# Patient Record
Sex: Male | Born: 1940
Health system: Southern US, Community
[De-identification: ages and names within clinical notes are randomized; demographics above are authoritative.]

## PROBLEM LIST (undated history)

## (undated) DIAGNOSIS — I2510 Atherosclerotic heart disease of native coronary artery without angina pectoris: Secondary | ICD-10-CM

## (undated) DIAGNOSIS — I252 Old myocardial infarction: Secondary | ICD-10-CM

## (undated) DIAGNOSIS — C801 Malignant (primary) neoplasm, unspecified: Secondary | ICD-10-CM

## (undated) DIAGNOSIS — M1990 Unspecified osteoarthritis, unspecified site: Secondary | ICD-10-CM

## (undated) DIAGNOSIS — I2119 ST elevation (STEMI) myocardial infarction involving other coronary artery of inferior wall: Secondary | ICD-10-CM

## (undated) DIAGNOSIS — M109 Gout, unspecified: Secondary | ICD-10-CM

## (undated) DIAGNOSIS — K219 Gastro-esophageal reflux disease without esophagitis: Secondary | ICD-10-CM

## (undated) DIAGNOSIS — E7800 Pure hypercholesterolemia, unspecified: Secondary | ICD-10-CM

## (undated) DIAGNOSIS — I10 Essential (primary) hypertension: Secondary | ICD-10-CM

## (undated) DIAGNOSIS — Z8719 Personal history of other diseases of the digestive system: Secondary | ICD-10-CM

## (undated) DIAGNOSIS — W57XXXA Bitten or stung by nonvenomous insect and other nonvenomous arthropods, initial encounter: Secondary | ICD-10-CM

## (undated) HISTORY — DX: Pure hypercholesterolemia, unspecified: E78.00

## (undated) HISTORY — PX: CARDIAC CATHETERIZATION: SHX172

## (undated) HISTORY — DX: Other disorders of calcium metabolism: E83.59

## (undated) HISTORY — DX: Old myocardial infarction: I25.2

## (undated) HISTORY — PX: MELANOMA EXCISION: SHX5266

## (undated) HISTORY — DX: ST elevation (STEMI) myocardial infarction involving other coronary artery of inferior wall: I21.19

## (undated) HISTORY — DX: Gout, unspecified: M10.9

## (undated) HISTORY — DX: Atherosclerotic heart disease of native coronary artery without angina pectoris: I25.10

## (undated) HISTORY — PX: EYE SURGERY: SHX253

## (undated) HISTORY — DX: Unspecified osteoarthritis, unspecified site: M19.90

## (undated) HISTORY — PX: SHOULDER ARTHROSCOPY WITH ROTATOR CUFF REPAIR: SHX5685

## (undated) HISTORY — PX: CORONARY ANGIOPLASTY: SHX604

---

## 2000-05-16 ENCOUNTER — Ambulatory Visit (HOSPITAL_COMMUNITY): Admission: RE | Admit: 2000-05-16 | Discharge: 2000-05-16 | Payer: Self-pay | Admitting: Family Medicine

## 2000-05-16 ENCOUNTER — Encounter: Payer: Self-pay | Admitting: Family Medicine

## 2002-08-20 ENCOUNTER — Ambulatory Visit (HOSPITAL_COMMUNITY): Admission: RE | Admit: 2002-08-20 | Discharge: 2002-08-20 | Payer: Self-pay | Admitting: Gastroenterology

## 2003-10-05 ENCOUNTER — Encounter (HOSPITAL_COMMUNITY): Admission: RE | Admit: 2003-10-05 | Discharge: 2004-01-03 | Payer: Self-pay | Admitting: Cardiology

## 2005-06-08 ENCOUNTER — Encounter: Admission: RE | Admit: 2005-06-08 | Discharge: 2005-06-08 | Payer: Self-pay | Admitting: Family Medicine

## 2005-09-24 ENCOUNTER — Emergency Department (HOSPITAL_COMMUNITY): Admission: EM | Admit: 2005-09-24 | Discharge: 2005-09-24 | Payer: Self-pay | Admitting: Emergency Medicine

## 2005-09-29 ENCOUNTER — Encounter: Admission: RE | Admit: 2005-09-29 | Discharge: 2005-09-29 | Payer: Self-pay | Admitting: Family Medicine

## 2005-12-06 ENCOUNTER — Ambulatory Visit (HOSPITAL_BASED_OUTPATIENT_CLINIC_OR_DEPARTMENT_OTHER): Admission: RE | Admit: 2005-12-06 | Discharge: 2005-12-06 | Payer: Self-pay | Admitting: Orthopedic Surgery

## 2008-01-15 ENCOUNTER — Encounter: Admission: RE | Admit: 2008-01-15 | Discharge: 2008-01-15 | Payer: Self-pay | Admitting: Family Medicine

## 2008-07-26 ENCOUNTER — Emergency Department (HOSPITAL_COMMUNITY): Admission: EM | Admit: 2008-07-26 | Discharge: 2008-07-27 | Payer: Self-pay | Admitting: Emergency Medicine

## 2009-08-24 IMAGING — CT CT ABDOMEN W/ CM
2 of 5 series · 13 of 32 positions shown, 18 images · IV contrast (water/omni  & 100 ML OMNI 300)
Comparison: None available

CT ABDOMEN

CLINICAL DATA: Abdominal pain.  Rib pain.

CT ABDOMEN AND PELVIS WITH CONTRAST
TECHNIQUE: Multidetector CT imaging of the abdomen and pelvis was
performed using the standard protocol following bolus
administration of intravenous contrast.
Contrast: 100 ml [id number].

[Series 2: routine abdomen · axial · 0.83mm/px · z∈[-484,-129]mm · 6 of 101 slices shown, 11 images]
[im 15/101  soft-tissue]
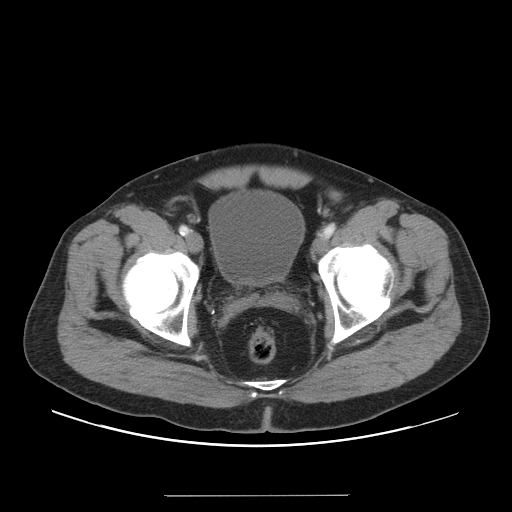
[im 15/101  bone]
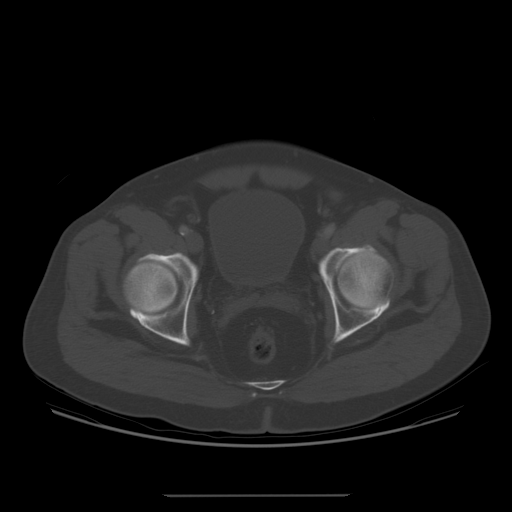
[im 29/101  soft-tissue]
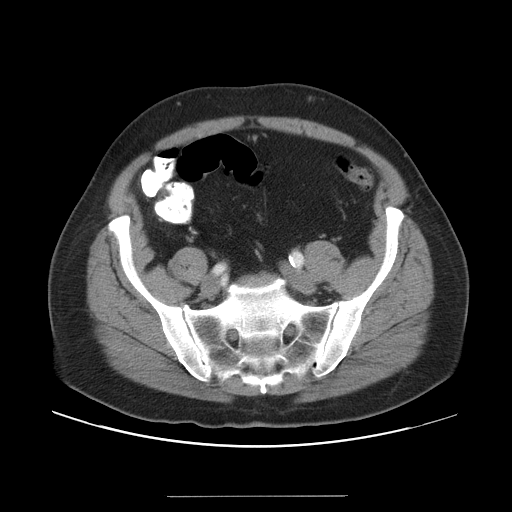
[im 43/101  soft-tissue]
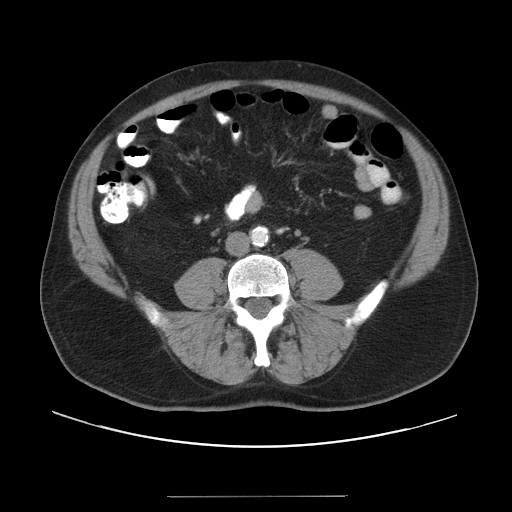
[im 43/101  lung]
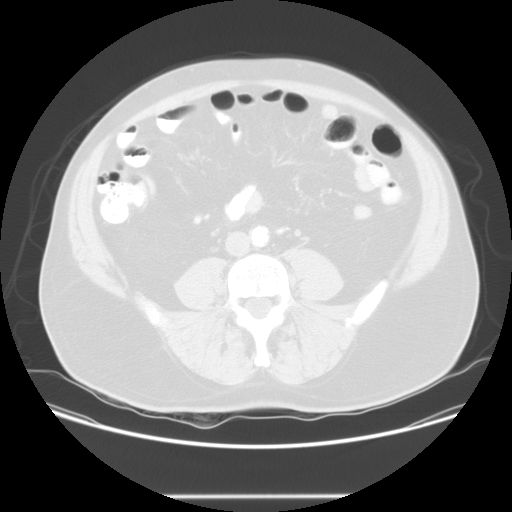
[im 58/101  soft-tissue]
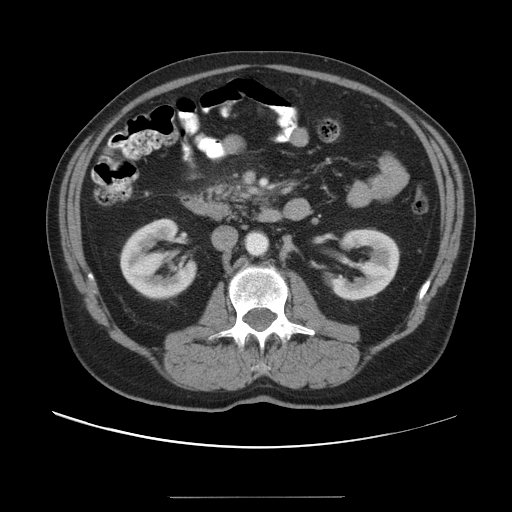
[im 58/101  lung]
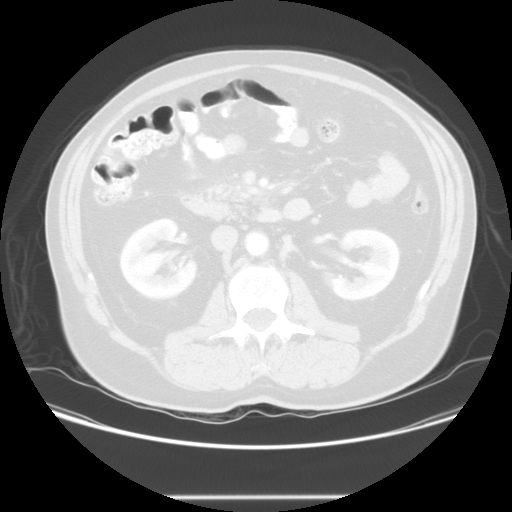
[im 72/101  soft-tissue]
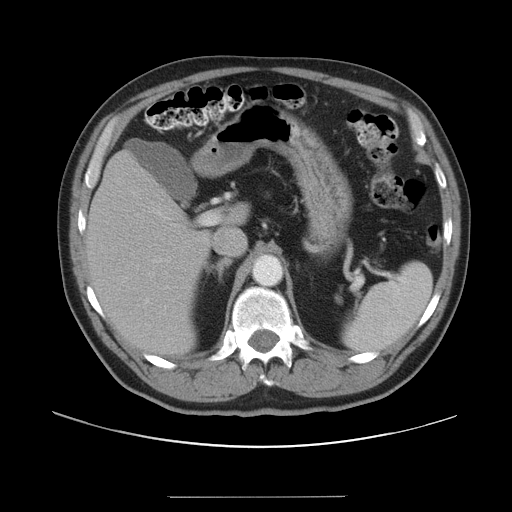
[im 72/101  lung]
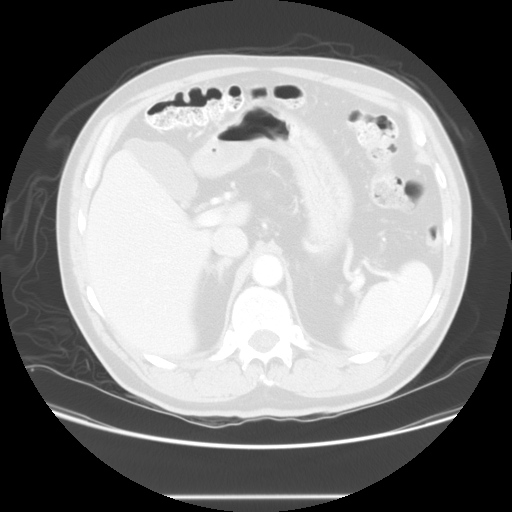
[im 86/101  soft-tissue]
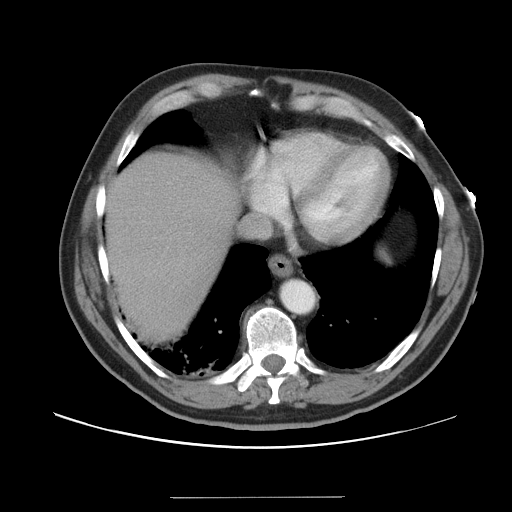
[im 86/101  lung]
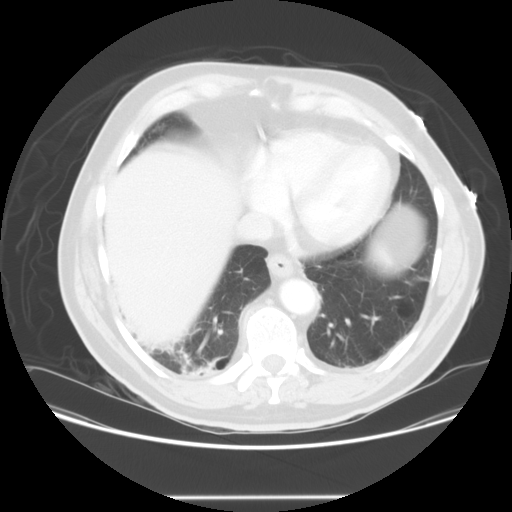

[Series 103: reformatted · sagittal · 0.90mm/px · 7 of 127 slices shown]
[im 15/127  soft-tissue]
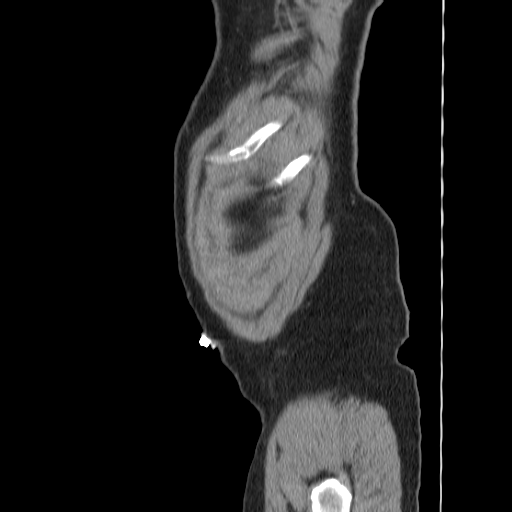
[im 29/127  soft-tissue]
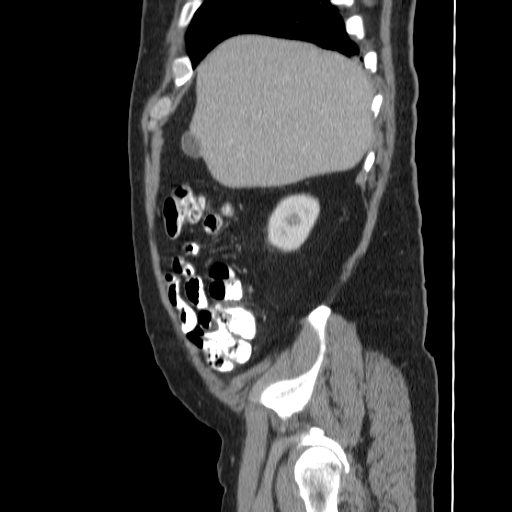
[im 43/127  soft-tissue]
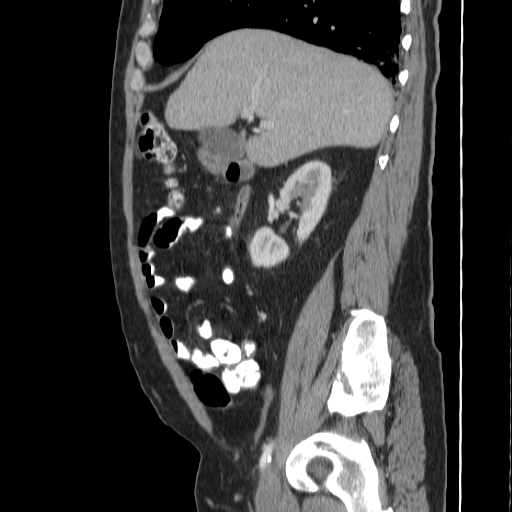
[im 57/127  soft-tissue]
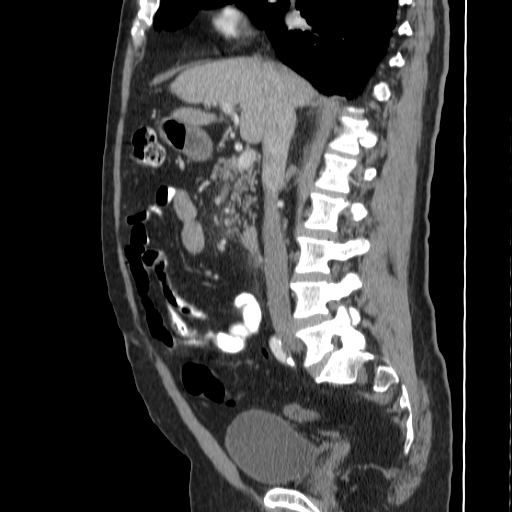
[im 71/127  soft-tissue]
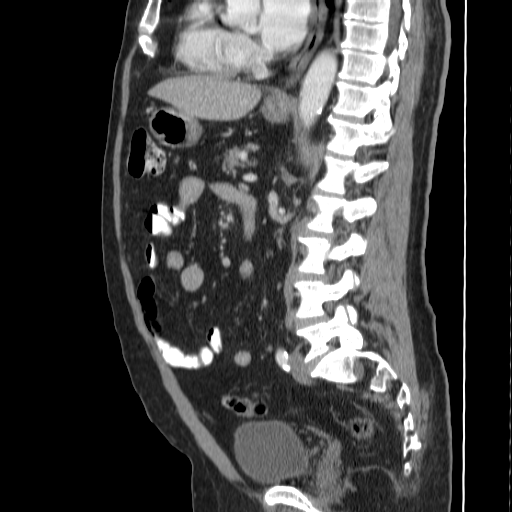
[im 85/127  soft-tissue]
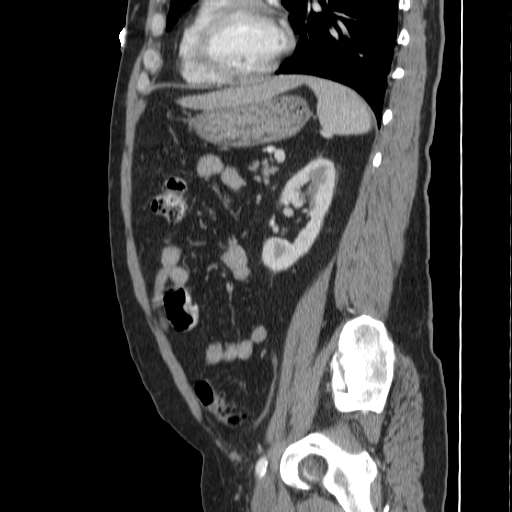
[im 99/127  soft-tissue]
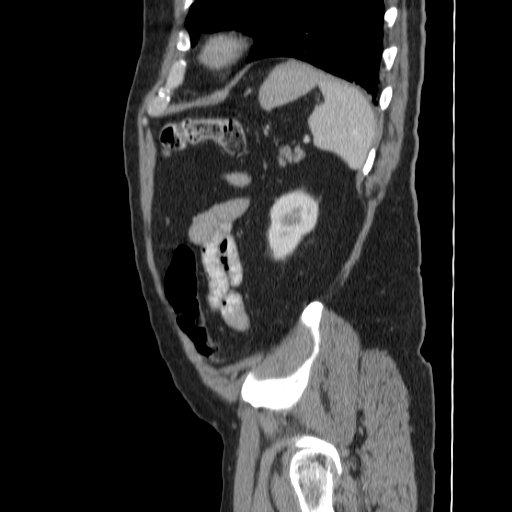

[13 of 32 positions shown; findings below may reference images not displayed]

FINDINGS: Right coronary artery stent is present.  Mild thickening
of the distal esophagus, which can be associated with
gastroesophageal reflux.  Consider endoscopic correlation as
clinically indicated.  Liver and spleen appear within normal
limits.  Stomach unremarkable.  In the posterior right lower lobe,
there is subpleural band, which is a nonspecific finding.  There is
more hazy opacity anteriorly, which is favored to represent
atelectasis however airspace disease is difficult to exclude.

There is subtle fat stranding around the uncinate process of the
pancreas and third part of the duodenum.  This could represent mild
pancreatitis.  Correlation with laboratory studies recommended.
Abdominal aorta demonstrates atherosclerosis and mild tortuosity
without aneurysm.
IMPRESSION: 1.  Subtle stranding around the uncinate process of the pancreas,
could represent pancreatitis.  Correlation with laboratory results
recommended.
2.  No other acute abdominal abnormality.
3.  Subpleural band and atelectasis in the right lower lobe.  It is
difficult to exclude a small focus of airspace disease/infection.
4.  Question gastroesophageal reflux.

CT PELVIS
FINDINGS: Normal appendix is identified.  Colon demonstrates
diverticulosis without diverticulitis.  No free fluid is present in
the anatomic pelvis.  Urinary bladder appears within normal limits.
Chunky prostatic calcifications are present.  Iliofemoral
atherosclerosis, with mural plaque in the right common iliac artery
but no occlusion.  Pelvic small bowel appears normal.
IMPRESSION: No acute pelvic abnormality.

## 2011-01-08 ENCOUNTER — Emergency Department (HOSPITAL_COMMUNITY): Payer: Managed Care, Other (non HMO)

## 2011-01-08 ENCOUNTER — Emergency Department (HOSPITAL_COMMUNITY)
Admission: EM | Admit: 2011-01-08 | Discharge: 2011-01-08 | Disposition: A | Discharge status: Home / Self Care | Payer: Managed Care, Other (non HMO) | Attending: Emergency Medicine | Admitting: Emergency Medicine

## 2011-01-08 DIAGNOSIS — I252 Old myocardial infarction: Secondary | ICD-10-CM | POA: Insufficient documentation

## 2011-01-08 DIAGNOSIS — R10816 Epigastric abdominal tenderness: Secondary | ICD-10-CM | POA: Insufficient documentation

## 2011-01-08 DIAGNOSIS — R072 Precordial pain: Secondary | ICD-10-CM | POA: Insufficient documentation

## 2011-01-08 DIAGNOSIS — R0602 Shortness of breath: Secondary | ICD-10-CM | POA: Insufficient documentation

## 2011-01-08 DIAGNOSIS — E785 Hyperlipidemia, unspecified: Secondary | ICD-10-CM | POA: Insufficient documentation

## 2011-01-08 DIAGNOSIS — R21 Rash and other nonspecific skin eruption: Secondary | ICD-10-CM | POA: Insufficient documentation

## 2011-01-08 DIAGNOSIS — K2970 Gastritis, unspecified, without bleeding: Secondary | ICD-10-CM | POA: Insufficient documentation

## 2011-01-08 DIAGNOSIS — I10 Essential (primary) hypertension: Secondary | ICD-10-CM | POA: Insufficient documentation

## 2011-01-08 DIAGNOSIS — Z79899 Other long term (current) drug therapy: Secondary | ICD-10-CM | POA: Insufficient documentation

## 2011-01-08 LAB — POCT CARDIAC MARKERS
CKMB, poc: <1 ng/mL — ABNORMAL LOW (ref 1.0–8.0)
CKMB, poc: <1 ng/mL — ABNORMAL LOW (ref 1.0–8.0)
Myoglobin, poc: 75.5 ng/mL (ref 12–200)
Myoglobin, poc: 70.1 ng/mL (ref 12–200)
Troponin i, poc: <0.05 ng/mL (ref 0.00–0.09)
Troponin i, poc: <0.05 ng/mL (ref 0.00–0.09)

## 2011-01-08 LAB — CBC
HCT: 45.4 % (ref 39.0–52.0)
Hemoglobin: 15.6 g/dL (ref 13.0–17.0)
MCH: 32.6 pg (ref 26.0–34.0)
MCHC: 34.4 g/dL (ref 30.0–36.0)
MCV: 94.8 fL (ref 78.0–100.0)
Platelets: 190 10*3/uL (ref 150–400)
RBC: 4.79 MIL/uL (ref 4.22–5.81)
RDW: 13.2 % (ref 11.5–15.5)
WBC: 13.1 10*3/uL — ABNORMAL HIGH (ref 4.0–10.5)

## 2011-01-08 LAB — BASIC METABOLIC PANEL
BUN: 16 mg/dL (ref 6–23)
CO2: 27 mEq/L (ref 19–32)
Calcium: 8.5 mg/dL (ref 8.4–10.5)
Chloride: 102 mEq/L (ref 96–112)
Creatinine, Ser: 0.84 mg/dL (ref 0.4–1.5)
GFR calc Af Amer: >60 mL/min (ref >60)
GFR calc non Af Amer: >60 mL/min (ref >60)
Glucose, Bld: 107 mg/dL — ABNORMAL HIGH (ref 70–99)
Potassium: 3.9 mEq/L (ref 3.5–5.1)
Sodium: 137 mEq/L (ref 135–145)

## 2011-01-08 LAB — DIFFERENTIAL
Basophils Absolute: 0 10*3/uL (ref 0.0–0.1)
Basophils Relative: 0 % (ref 0–1)
Eosinophils Absolute: 0.1 10*3/uL (ref 0.0–0.7)
Eosinophils Relative: 1 % (ref 0–5)
Lymphocytes Relative: 8 % — ABNORMAL LOW (ref 12–46)
Lymphs Abs: 1 10*3/uL (ref 0.7–4.0)
Monocytes Absolute: 1.1 10*3/uL — ABNORMAL HIGH (ref 0.1–1.0)
Monocytes Relative: 9 % (ref 3–12)
Neutro Abs: 10.8 10*3/uL — ABNORMAL HIGH (ref 1.7–7.7)
Neutrophils Relative %: 83 % — ABNORMAL HIGH (ref 43–77)

## 2011-03-17 NOTE — Op Note (Signed)
NAME:  Robert Reilly, Robert Reilly NO.:  [id number]   MEDICAL RECORD NO.:  [id number]          PATIENT TYPE:  AMB   LOCATION:  DSC                          FACILITY:  [facility]   PHYSICIAN:  Cindee Salt, M.D.       DATE OF BIRTH:  10-17-1941   DATE OF PROCEDURE:  12/06/2005  DATE OF DISCHARGE:                                 OPERATIVE REPORT   PREOPERATIVE DIAGNOSIS:  Ruptured extensor tendon hood sagittal fibers  radial side, left ring finger.   POSTOPERATIVE DIAGNOSIS:  Ruptured extensor tendon hood sagittal fibers  radial side, left ring finger.   OPERATION:  Reconstruction extensor hood left ring finger, with plication of  extensor tendon.   SURGEON:  Cindee Salt, M.D.   ASSISTANT:  Carolyne Fiscal R.N.   ANESTHESIA:  General.   HISTORY:  The patient is a 70 year old male who has suffered an injury to  his left ring finger, with inability to fully extend. MRI reveals probable  rupture of the extensor hood.   PROCEDURE:  The patient was brought to the operating room where a general  anesthetic was carried out without difficulty.  He was prepped using  DuraPrep, supine position, left arm free. The limb was exsanguinated with an  Esmarch bandage. Tourniquet placed on the arm was inflated to 250 mmHg.  A  straight incision was made over the metacarpophalangeal joint, proximal  phalanx, carried down through subcutaneous tissue. Bleeders were  electrocauterized.  Neurovascular structures otherwise protected. Retractors  placed. Significant scarring of the extensor hood was immediately apparent,  with some subluxation to the ulnar aspect of the hood itself. With blunt and  sharp dissection, the extensor tendon was then able to be identified and  found to be in continuity, although it was stretched directly over the  metacarpophalangeal joint. A portion of the extensor tendon to the little  finger, along with the junctura was then isolated, maintaining the majority  of the tendon to  the little finger intact. This allowed this to be used to  reconstruct the sagittal fiber of the ring finger, along with the portion of  scar tissue. This tendon portion was then passed through the extensor  digitorum communis, through the scar of the old hood, and then looped around  the intermetacarpal ligament. This was brought back onto itself and then  sutured into position with figure-of-eight 2-0 Novofil suture. This  stabilized the extensor tendon in both full flexion and extension. There  remained a very minimal lag, and the tendon was then plicated with a 4-0  Mersilene suture. The wound was copiously irrigated with saline. The finger  was maintained in fully extended position on completion.  The wound was then  closed with interrupted 5-0 nylon sutures. Sterile compressive dressing and  splint was applied, maintaining  the tendon in slightly greater extension than the remaining fingers. The  patient tolerated the procedure well was taken to the recovery for  observation in satisfactory condition. He is discharged home, to return to  the [facility] [place] in 1 week, on Vicodin.  ______________________________  Cindee Salt, M.D.     GK/MEDQ  D:  12/06/2005  T:  12/06/2005  Job:  [id number]

## 2011-03-17 NOTE — Op Note (Signed)
   NAME:  KESHAWN, FIORITO                            ACCOUNT NO.:  [id number]   MEDICAL RECORD NO.:  [id number]                   PATIENT TYPE:  AMB   LOCATION:  ENDO                                 FACILITY:  [facility]   PHYSICIAN:  Danise Edge, M.D.                DATE OF BIRTH:  1941/04/04   DATE OF PROCEDURE:  08/20/2002  DATE OF DISCHARGE:                                 OPERATIVE REPORT   PROCEDURE:  Screening colonoscopy.   INDICATIONS FOR PROCEDURE:  Mr. Toshio Slusher is a 70 year old male born [birth year]. Mr. Theurer is scheduled to undergo his first screening colonoscopy  with polypectomy to prevent colon cancer. I discussed with Mr. Fana the  complications associated with colonoscopy and polypectomy including a  15/1000 risk of bleeding and 10/998 risk of colon perforation requiring  surgical repair. Mr. Stegmann has signed the operative permit.   ENDOSCOPIST:  Charolett Bumpers, M.D.   PREMEDICATION:  Versed 10 mg, Demerol 50 mg .   ENDOSCOPE:  Olympus pediatric colonoscope.   DESCRIPTION OF PROCEDURE:  After obtaining informed consent, Mr. Conrad was  placed in the left lateral decubitus position. I administered intravenous  Demerol and intravenous Versed to achieve conscious sedation for the  procedure. The patient's blood pressure, oxygen saturation and cardiac  rhythm were monitored throughout the procedure and documented in the medical  record.   Anal inspection was normal. Digital rectal exam revealed a nonnodular  prostate. The Olympus pediatric video colonoscope was introduced into the  rectum and easily advanced to the cecum. Colonic preparation for the exam  today was excellent.   RECTUM:  Normal.   SIGMOID COLON AND DESCENDING COLON:  Normal.   SPLENIC FLEXURE:  Normal.   TRANSVERSE COLON:  Normal.   HEPATIC FLEXURE:  Normal.   ASCENDING COLON:  Normal.   CECUM AND ILEOCECAL VALVE:  Normal.    ASSESSMENT:  Normal screening proctocolonoscopy to the cecum.  No endoscopic  evidence for the presence of colorectal neoplasia.                                                 Danise Edge, M.D.    MJ/MEDQ  D:  08/20/2002  T:  08/20/2002  Job:  [id number]   cc:   L. Lupe Carney, M.D.

## 2011-07-31 LAB — DIFFERENTIAL
Basophils Absolute: 0
Basophils Relative: 0
Eosinophils Absolute: 0.3
Eosinophils Relative: 3
Lymphocytes Relative: 13
Lymphs Abs: 1.3
Monocytes Absolute: 0.7
Monocytes Relative: 7
Neutro Abs: 7.8 — ABNORMAL HIGH
Neutrophils Relative %: 77

## 2011-07-31 LAB — CK TOTAL AND CKMB (NOT AT ARMC)
CK, MB: 1.2
Relative Index: INVALID
Total CK: 49

## 2011-07-31 LAB — CBC
HCT: 43.1
Hemoglobin: 14.4
MCHC: 33.4
MCV: 97.7
Platelets: 197
RBC: 4.41
RDW: 13.9
WBC: 10

## 2011-07-31 LAB — COMPREHENSIVE METABOLIC PANEL
ALT: 17
AST: 20
Albumin: 3.5
Alkaline Phosphatase: 47
BUN: 10
CO2: 25
Calcium: 8.7
Chloride: 106
Creatinine, Ser: 0.97
GFR calc Af Amer: >60
GFR calc non Af Amer: >60
Glucose, Bld: 112 — ABNORMAL HIGH
Potassium: 3.9
Sodium: 136
Total Bilirubin: 0.7
Total Protein: 6.2

## 2011-07-31 LAB — URINALYSIS, ROUTINE W REFLEX MICROSCOPIC
Bilirubin Urine: NEGATIVE
Glucose, UA: NEGATIVE
Hgb urine dipstick: NEGATIVE
Ketones, ur: NEGATIVE
Nitrite: NEGATIVE
Protein, ur: NEGATIVE
Specific Gravity, Urine: 1.006
Urobilinogen, UA: 0.2
pH: 7.5

## 2011-07-31 LAB — LIPASE, BLOOD: Lipase: 30

## 2011-07-31 LAB — POCT CARDIAC MARKERS
CKMB, poc: 1.4
Myoglobin, poc: 56.7
Troponin i, poc: <0.05

## 2013-01-22 ENCOUNTER — Other Ambulatory Visit: Payer: Self-pay | Admitting: Rheumatology

## 2013-01-22 DIAGNOSIS — M79609 Pain in unspecified limb: Secondary | ICD-10-CM

## 2013-01-28 ENCOUNTER — Ambulatory Visit
Admission: RE | Admit: 2013-01-28 | Discharge: 2013-01-28 | Disposition: A | Discharge status: Home / Self Care | Payer: Managed Care, Other (non HMO) | Source: Ambulatory Visit | Attending: Rheumatology | Admitting: Rheumatology

## 2013-01-28 DIAGNOSIS — M79609 Pain in unspecified limb: Secondary | ICD-10-CM

## 2013-01-28 MED ORDER — GADOBENATE DIMEGLUMINE 529 MG/ML IV SOLN
18.0000 mL | Freq: Once | INTRAVENOUS | Status: AC | PRN
  Administered 2013-01-28: 18 mL via INTRAVENOUS

## 2013-07-26 ENCOUNTER — Other Ambulatory Visit: Payer: Self-pay | Admitting: Cardiology

## 2013-07-26 DIAGNOSIS — Z79899 Other long term (current) drug therapy: Secondary | ICD-10-CM

## 2013-07-26 DIAGNOSIS — E7800 Pure hypercholesterolemia, unspecified: Secondary | ICD-10-CM

## 2013-08-04 ENCOUNTER — Other Ambulatory Visit: Payer: Managed Care, Other (non HMO)

## 2013-09-09 ENCOUNTER — Encounter: Payer: Self-pay | Admitting: Cardiology

## 2013-09-09 ENCOUNTER — Encounter: Payer: Self-pay | Admitting: *Deleted

## 2013-09-10 ENCOUNTER — Ambulatory Visit (INDEPENDENT_AMBULATORY_CARE_PROVIDER_SITE_OTHER): Payer: Managed Care, Other (non HMO) | Admitting: Cardiology

## 2013-09-10 ENCOUNTER — Encounter: Payer: Self-pay | Admitting: Cardiology

## 2013-09-10 VITALS — BP 144/87 | HR 58 | Ht 68.5 in | Wt 195.8 lb

## 2013-09-10 DIAGNOSIS — I10 Essential (primary) hypertension: Secondary | ICD-10-CM | POA: Insufficient documentation

## 2013-09-10 DIAGNOSIS — M069 Rheumatoid arthritis, unspecified: Secondary | ICD-10-CM | POA: Insufficient documentation

## 2013-09-10 DIAGNOSIS — I25110 Atherosclerotic heart disease of native coronary artery with unstable angina pectoris: Secondary | ICD-10-CM | POA: Insufficient documentation

## 2013-09-10 DIAGNOSIS — I252 Old myocardial infarction: Secondary | ICD-10-CM

## 2013-09-10 DIAGNOSIS — I2510 Atherosclerotic heart disease of native coronary artery without angina pectoris: Secondary | ICD-10-CM

## 2013-09-10 HISTORY — DX: Old myocardial infarction: I25.2

## 2013-09-10 LAB — LIPID PANEL
Cholesterol: 109 mg/dL (ref 0–200)
HDL: 35.8 mg/dL — ABNORMAL LOW (ref >39.00)
LDL Cholesterol: 41 mg/dL (ref 0–99)
Total CHOL/HDL Ratio: 3
Triglycerides: 162 mg/dL — ABNORMAL HIGH (ref 0.0–149.0)
VLDL: 32.4 mg/dL (ref 0.0–40.0)

## 2013-09-10 LAB — ALT: ALT: 30 U/L (ref 0–53)

## 2013-09-10 NOTE — Patient Instructions (Signed)
Your physician recommends that you continue on your current medications as directed. Please refer to the Current Medication list given to you today.  Your physician recommends that you have labs today: Lipid, ALT  Your physician wants you to follow-up in:  1 year with Dr. Anne Fu. You will receive a reminder letter in the mail two months in advance. If you don't receive a letter, please call our office to schedule the follow-up appointment.

## 2013-09-10 NOTE — Progress Notes (Signed)
[address] [place], [place]  [address] Phone: [phone] Fax:  [phone]  Date:  09/10/2013   ID:  Robert Reilly, DOB [birth year], MRN [id number]  PCP:  Lupe Carney, MD   History of Present Illness: Robert Reilly is a 72 y.o. male with coronary artery disease post myocardial infarction in 2004 with stent to RCA who recently was seen secondary to elevated blood pressure.  Nuc- inferior infarct with no ischemia, normal EF on 01/10/11. No change from prior.   His wife is doing to need a repeat shunt procedure at [facility].  He has been diagnosed with Rheumatoid arthritis. Ankle pain. Swelling. This was prior to amlodipine.  At home previously having diasotlics of 100 at times. 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  Had cataract surgery done. Had brief episode of chest discomfort lasting approximally one hour that was fleeting, no further episodes. Worried him briefly, moderate in severity.  His son is interested in giving him a diet, liquid diet. He is interested in trying to come off of some of the medications. I discussed this with him. I discussed the importance of secondary prevention.    Wt Readings from Last 3 Encounters:  09/10/13 195 lb 12.8 oz (88.814 kg)     Past Medical History  Diagnosis Date  . Hypercholesteremia   . Coronary atherosclerosis of native coronary artery   . Gout   . Osteoarthritis   . Acute myocardial infarction of other inferior wall, subsequent episode of care   . Other disorder of calcium metabolism   . Old MI (myocardial infarction) 09/10/2013    2004, stent to RCA    No past surgical history on file.  Current Outpatient Prescriptions  Medication Sig Dispense Refill  . amLODipine (NORVASC) 5 MG tablet Take 5 mg by mouth daily.       Marland Kitchen aspirin 81 MG tablet Take 81 mg by mouth daily.      . colchicine 0.6 MG tablet Take 0.6 mg by mouth daily.      . hydrochlorothiazide (HYDRODIURIL) 25 MG tablet Take 25 mg  by mouth daily.      . meloxicam (MOBIC) 15 MG tablet Take 15 mg by mouth daily.       . metoprolol succinate (TOPROL XL) 50 MG 24 hr tablet Take 50 mg by mouth daily. Take with or immediately following a meal.      . nitroGLYCERIN (NITROSTAT) 0.4 MG SL tablet Place 0.4 mg under the tongue every 5 (five) minutes as needed for chest pain.      Marland Kitchen omeprazole (PRILOSEC) 20 MG capsule Take 20 mg by mouth daily.      . ramipril (ALTACE) 10 MG capsule Take 10 mg by mouth 2 (two) times daily.       . rosuvastatin (CRESTOR) 20 MG tablet Take 20 mg by mouth daily.      . VESICARE 5 MG tablet Take 5 mg by mouth daily.        No current facility-administered medications for this visit.    Allergies:   No Known Allergies  Social History:  The patient  reports that he has quit smoking. He does not have any smokeless tobacco history on file. He reports that he drinks about 4.2 ounces of alcohol per week. He reports that he does not use illicit drugs.   ROS:  Please see the history of present illness.   Denies any syncope, bleeding, orthopnea, PND  PHYSICAL EXAM: VS:  BP 144/87  Pulse 58  Ht 5' 8.5" (1.74 m)  Wt 195 lb 12.8 oz (88.814 kg)  BMI 29.33 kg/m2 Well nourished, well developed, in no acute distress HEENT: normal Neck: no JVD Cardiac:  normal S1, S2; RRR; no murmur Lungs:  clear to auscultation bilaterally, no wheezing, rhonchi or rales Abd: soft, nontender, no hepatomegalyMildly overweight Ext: no edema Skin: warm and dry Neuro: no focal abnormalities noted  EKG:  Sinus bradycardia rate 54 with no other abnormalities.     ASSESSMENT AND PLAN:  1. Coronary artery disease-currently stable, no symptoms. Medications reviewed. 2. Old myocardial infarction-inferior. Prior RCA stent. 3. Rheumatoid arthritis-currently being managed by rheumatology. Dr. Nickola Major. Only one flair up. Toes and fingers. Originally ankle edema. Being treated for gout currently. Able to walk over the past month.  Improved. 4. Hypertension-borderline elevated today. Continue with current medications. 5. Hyperlipidemia-Crestor. We'll check lipid profile, ALT.  Signed, Donato Schultz, MD [facility]  09/10/2013 12:07 PM

## 2013-10-28 ENCOUNTER — Other Ambulatory Visit: Payer: Self-pay | Admitting: Cardiology

## 2014-02-18 ENCOUNTER — Other Ambulatory Visit: Payer: Self-pay | Admitting: Cardiology

## 2014-03-05 ENCOUNTER — Ambulatory Visit (INDEPENDENT_AMBULATORY_CARE_PROVIDER_SITE_OTHER): Payer: BC Managed Care – PPO | Admitting: *Deleted

## 2014-03-05 DIAGNOSIS — Z79899 Other long term (current) drug therapy: Secondary | ICD-10-CM

## 2014-03-05 DIAGNOSIS — E7800 Pure hypercholesterolemia, unspecified: Secondary | ICD-10-CM

## 2014-03-05 LAB — BASIC METABOLIC PANEL
BUN: 21 mg/dL (ref 6–23)
CO2: 29 mEq/L (ref 19–32)
Calcium: 9.1 mg/dL (ref 8.4–10.5)
Chloride: 105 mEq/L (ref 96–112)
Creatinine, Ser: 1 mg/dL (ref 0.4–1.5)
GFR: 79.95 mL/min (ref >60.00)
Glucose, Bld: 102 mg/dL — ABNORMAL HIGH (ref 70–99)
Potassium: 3.3 mEq/L — ABNORMAL LOW (ref 3.5–5.1)
Sodium: 141 mEq/L (ref 135–145)

## 2014-03-05 LAB — ALT: ALT: 26 U/L (ref 0–53)

## 2014-03-06 LAB — NMR LIPOPROFILE WITH LIPIDS
Cholesterol, Total: 142 mg/dL (ref <200)
HDL Particle Number: 36.6 umol/L (ref >=30.5)
HDL Size: 8.6 nm — ABNORMAL LOW (ref >=9.2)
HDL-C: 38 mg/dL — ABNORMAL LOW (ref >=40)
LDL (calc): 64 mg/dL (ref <100)
LDL Particle Number: 1040 nmol/L — ABNORMAL HIGH (ref <1000)
LDL Size: 19.7 nm — ABNORMAL LOW (ref >20.5)
LP-IR Score: 75 — ABNORMAL HIGH (ref <=45)
Large HDL-P: 2.6 umol/L — ABNORMAL LOW (ref >=4.8)
Large VLDL-P: 6.5 nmol/L — ABNORMAL HIGH (ref <=2.7)
Small LDL Particle Number: 846 nmol/L — ABNORMAL HIGH (ref <=527)
Triglycerides: 199 mg/dL — ABNORMAL HIGH (ref <150)
VLDL Size: 46.9 nm — ABNORMAL HIGH (ref <=46.6)

## 2014-03-09 ENCOUNTER — Ambulatory Visit (INDEPENDENT_AMBULATORY_CARE_PROVIDER_SITE_OTHER): Payer: BC Managed Care – PPO | Admitting: Cardiology

## 2014-03-09 ENCOUNTER — Encounter: Payer: Self-pay | Admitting: Cardiology

## 2014-03-09 VITALS — BP 147/87 | HR 64 | Ht 68.0 in | Wt 198.0 lb

## 2014-03-09 DIAGNOSIS — I252 Old myocardial infarction: Secondary | ICD-10-CM

## 2014-03-09 DIAGNOSIS — E7800 Pure hypercholesterolemia, unspecified: Secondary | ICD-10-CM

## 2014-03-09 DIAGNOSIS — I10 Essential (primary) hypertension: Secondary | ICD-10-CM

## 2014-03-09 DIAGNOSIS — I2510 Atherosclerotic heart disease of native coronary artery without angina pectoris: Secondary | ICD-10-CM

## 2014-03-09 DIAGNOSIS — M069 Rheumatoid arthritis, unspecified: Secondary | ICD-10-CM

## 2014-03-09 NOTE — Patient Instructions (Signed)
Your physician recommends that you continue on your current medications as directed. Please refer to the Current Medication list given to you today.  Your physician wants you to follow-up in: 1 year with Dr. Dawna Part will receive a reminder letter in the mail two months in advance. If you don't receive a letter, please call our office to schedule the follow-up appointment.

## 2014-03-09 NOTE — Progress Notes (Signed)
[place]. [address], [address] [place],   [address] Phone: [phone] Fax:  [phone]  Date:  03/09/2014   ID:  Robert Reilly, DOB February 03, 1941, MRN [id number]  PCP:  Donnie Coffin, MD   History of Present Illness: Robert Reilly is a 73 y.o. male with coronary artery disease post myocardial infarction in 2004 with stent to RCA who recently was seen secondary to elevated blood pressure.  Nuc- inferior infarct with no ischemia, normal EF on 01/10/11. No change from prior.   His wife is doing to need a repeat shunt procedure at [facility].  He has been diagnosed with Rheumatoid arthritis, pseudogout. Ankle pain. Swelling. This was prior to amlodipine. Meloxicam has helped. Toe improved.   At home previously having diasotlics of 440 at times. 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  Had cataract surgery done. Had brief episode of chest discomfort lasting approximally one hour that was fleeting, no further episodes. Worried him briefly, moderate in severity.  His son is interested in giving him a diet, liquid diet. He is interested in trying to come off of some of the medications. I discussed this with him. I discussed the importance of secondary prevention.  Admits to missing the Crestor once a week. Small LDL 846. He is motivated for walking, exercise. He admits that he does a lot of walking during hunting season but recently has been renovating his house and this was stopped. Enjoyed a [employer] Gin and tonic.     Wt Readings from Last 3 Encounters:  03/09/14 198 lb (89.812 kg)  09/10/13 195 lb 12.8 oz (88.814 kg)     Past Medical History  Diagnosis Date  . Hypercholesteremia   . Coronary atherosclerosis of native coronary artery   . Gout   . Osteoarthritis   . Acute myocardial infarction of other inferior wall, subsequent episode of care   . Other disorder of calcium metabolism   . Old MI (myocardial infarction) 09/10/2013    2004, stent to RCA    No past  surgical history on file.  Current Outpatient Prescriptions  Medication Sig Dispense Refill  . amLODipine (NORVASC) 5 MG tablet TAKE 1 TABLET BY MOUTH DAILY.  30 tablet  5  . aspirin 81 MG tablet Take 81 mg by mouth daily.      . colchicine 0.6 MG tablet Take 0.6 mg by mouth daily.      . CRESTOR 20 MG tablet TAKE 1 TABLET BY MOUTH DAILY AT BEDTIME.  30 tablet  6  . hydrochlorothiazide (HYDRODIURIL) 25 MG tablet Take 25 mg by mouth daily.      . meloxicam (MOBIC) 15 MG tablet Take 15 mg by mouth daily.       . metoprolol succinate (TOPROL XL) 50 MG 24 hr tablet Take 50 mg by mouth daily. Take with or immediately following a meal.      . nitroGLYCERIN (NITROSTAT) 0.4 MG SL tablet Place 0.4 mg under the tongue every 5 (five) minutes as needed for chest pain.      Marland Kitchen omeprazole (PRILOSEC) 20 MG capsule Take 20 mg by mouth daily.      . ramipril (ALTACE) 10 MG capsule Take 10 mg by mouth 2 (two) times daily.       . VESICARE 5 MG tablet Take 5 mg by mouth daily.        No current facility-administered medications for this visit.    Allergies:   No Known Allergies  Social History:  The patient  reports that he has quit smoking. He does not have any smokeless tobacco history on file. He reports that he drinks about 4.2 ounces of alcohol per week. He reports that he does not use illicit drugs.   ROS:  Please see the history of present illness.   Denies any syncope, bleeding, orthopnea, PND   PHYSICAL EXAM: VS:  BP 147/87  Pulse 64  Ht 5\' 8"  (1.727 m)  Wt 198 lb (89.812 kg)  BMI 30.11 kg/m2 Well nourished, well developed, in no acute distress HEENT: normal Neck: no JVD Cardiac:  normal S1, S2; RRR; no murmur Lungs:  clear to auscultation bilaterally, no wheezing, rhonchi or rales Abd: soft, nontender, no hepatomegalyMildly overweight Ext: no edema Skin: warm and dry Neuro: no focal abnormalities noted  EKG:  Sinus bradycardia rate 54 with no other abnormalities.    LABS: LDL 64,  triglycerides 199 ASSESSMENT AND PLAN:  1. Coronary artery disease-currently stable, no symptoms. Medications reviewed. 2. Old myocardial infarction-inferior. Prior RCA stent. 3. Rheumatoid arthritis-currently being managed by rheumatology. Dr. Trudie Reed. Only one flair up. Toes and fingers. Originally ankle edema. Being treated for gout currently. Able to walk over the past month. Improved. Taking NSAID. We discussed small incremental risk and myocardial infarction with anti-inflammatory chronic use. 4. Hypertension-borderline elevated today. Continue with current medications. Discussed diet, exercise at length. 5. Hyperlipidemia-Crestor. Small LDL increased. I mentioned to him the possibility of starting Niaspan however he would like to continue with diet, exercise, become more active. His son is trying to help motivate him. I'm fine with him continuing with Crestor only. He does admit to missing a dose perhaps once a week.   Signed, Candee Furbish, MD [facility]  03/09/2014 9:56 AM

## 2014-03-31 ENCOUNTER — Other Ambulatory Visit: Payer: Self-pay | Admitting: Cardiology

## 2014-04-14 ENCOUNTER — Other Ambulatory Visit: Payer: Self-pay | Admitting: Cardiology

## 2014-06-16 ENCOUNTER — Other Ambulatory Visit: Payer: Self-pay

## 2014-06-16 MED ORDER — ROSUVASTATIN CALCIUM 20 MG PO TABS: ORAL_TABLET | ORAL | Status: DC

## 2014-08-17 ENCOUNTER — Other Ambulatory Visit: Payer: Self-pay | Admitting: Cardiology

## 2015-01-29 ENCOUNTER — Other Ambulatory Visit: Payer: Self-pay | Admitting: Cardiology

## 2015-03-15 ENCOUNTER — Encounter: Payer: Self-pay | Admitting: Cardiology

## 2015-03-15 ENCOUNTER — Ambulatory Visit (INDEPENDENT_AMBULATORY_CARE_PROVIDER_SITE_OTHER): Payer: BLUE CROSS/BLUE SHIELD | Admitting: Cardiology

## 2015-03-15 ENCOUNTER — Other Ambulatory Visit: Payer: Self-pay | Admitting: *Deleted

## 2015-03-15 VITALS — BP 122/86 | HR 51 | Ht 68.0 in | Wt 198.0 lb

## 2015-03-15 DIAGNOSIS — Z79899 Other long term (current) drug therapy: Secondary | ICD-10-CM | POA: Diagnosis not present

## 2015-03-15 DIAGNOSIS — M069 Rheumatoid arthritis, unspecified: Secondary | ICD-10-CM | POA: Diagnosis not present

## 2015-03-15 DIAGNOSIS — E785 Hyperlipidemia, unspecified: Secondary | ICD-10-CM

## 2015-03-15 DIAGNOSIS — I10 Essential (primary) hypertension: Secondary | ICD-10-CM | POA: Diagnosis not present

## 2015-03-15 DIAGNOSIS — I2510 Atherosclerotic heart disease of native coronary artery without angina pectoris: Secondary | ICD-10-CM

## 2015-03-15 DIAGNOSIS — I252 Old myocardial infarction: Secondary | ICD-10-CM | POA: Diagnosis not present

## 2015-03-15 LAB — LDL CHOLESTEROL, DIRECT: Direct LDL: 76 mg/dL

## 2015-03-15 LAB — LIPID PANEL
Cholesterol: 146 mg/dL (ref 0–200)
HDL: 40.1 mg/dL (ref >39.00)
NonHDL: 105.9
Total CHOL/HDL Ratio: 4
Triglycerides: 216 mg/dL — ABNORMAL HIGH (ref 0.0–149.0)
VLDL: 43.2 mg/dL — ABNORMAL HIGH (ref 0.0–40.0)

## 2015-03-15 LAB — ALT: ALT: 22 U/L (ref 0–53)

## 2015-03-15 NOTE — Progress Notes (Signed)
[place]. [address], [place]  [address] Phone: [phone] Fax:  [phone]  Date:  03/15/2015   ID:  Robert Reilly, DOB 02/09/1941, MRN [id number]  PCP:  Donnie Coffin, MD   History of Present Illness: Robert Reilly is a 73 y.o. male with coronary artery disease post myocardial infarction in 2004 with stent to RCA who recently was seen secondary to elevated blood pressure.  Nuc- inferior infarct with no ischemia, normal EF on 01/10/11. No change from prior.   His wife is doing to need a repeat shunt procedure at [facility].  He has been diagnosed with Rheumatoid arthritis, pseudogout. Ankle pain. Swelling. This was prior to amlodipine. Meloxicam has helped. Toe improved.   At home previously having diasotlics of 329 at times. 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  Had cataract surgery done. Had brief episode of chest discomfort lasting approximally one hour that was fleeting, no further episodes. Worried him briefly, moderate in severity.  His son is interested in giving him a diet, liquid diet. He is interested in trying to come off of some of the medications. I discussed this with him. I discussed the importance of secondary prevention.  Admits to missing the Crestor once a week. Small LDL 846. He is motivated for walking, exercise. He admits that he does a lot of walking during hunting season but recently has been renovating his house and this was stopped. Enjoyed a [employer] Gin and tonic here and there.     Wt Readings from Last 3 Encounters:  03/15/15 198 lb (89.812 kg)  03/09/14 198 lb (89.812 kg)  09/10/13 195 lb 12.8 oz (88.814 kg)     Past Medical History  Diagnosis Date  . Hypercholesteremia   . Coronary atherosclerosis of native coronary artery   . Gout   . Osteoarthritis   . Acute myocardial infarction of other inferior wall, subsequent episode of care   . Other disorder of calcium metabolism   . Old MI (myocardial infarction)  09/10/2013    2004, stent to RCA    No past surgical history on file.  Current Outpatient Prescriptions  Medication Sig Dispense Refill  . allopurinol (ZYLOPRIM) 100 MG tablet Take 100 mg by mouth 2 (two) times daily.   1  . amLODipine (NORVASC) 5 MG tablet TAKE 1 TABLET BY MOUTH DAILY. 30 tablet 6  . aspirin 81 MG tablet Take 81 mg by mouth daily.    . colchicine 0.6 MG tablet Take 0.6 mg by mouth daily.    . CRESTOR 20 MG tablet TAKE 1 TABLET BY MOUTH DAILY AT BEDTIME. 30 tablet 1  . hydrochlorothiazide (HYDRODIURIL) 25 MG tablet TAKE 1 TABLET BY MOUTH EVERY MORNING. 30 tablet 12  . meloxicam (MOBIC) 15 MG tablet Take 15 mg by mouth daily.     . metoprolol succinate (TOPROL-XL) 50 MG 24 hr tablet TAKE 1 TABLET BY MOUTH DAILY. 30 tablet 12  . nitroGLYCERIN (NITROSTAT) 0.4 MG SL tablet Place 0.4 mg under the tongue every 5 (five) minutes as needed for chest pain.    Marland Kitchen omeprazole (PRILOSEC) 20 MG capsule TAKE 1 CAPSULE BY MOUTH DAILY. 30 capsule 12  . ramipril (ALTACE) 10 MG capsule TAKE 1 CAPSULE BY MOUTH TWICE DAILY. 60 capsule 12  . VESICARE 5 MG tablet Take 5 mg by mouth daily.      No current facility-administered medications for this visit.    Allergies:    Allergies  Allergen Reactions  . Tetracyclines & Related Rash    Social History:  The patient  reports that he has quit smoking. He does not have any smokeless tobacco history on file. He reports that he drinks about 4.2 oz of alcohol per week. He reports that he does not use illicit drugs.   ROS:  Please see the history of present illness.   Denies any syncope, bleeding, orthopnea, PND   PHYSICAL EXAM: VS:  BP 122/86 mmHg  Pulse 51  Ht 5\' 8"  (1.727 m)  Wt 198 lb (89.812 kg)  BMI 30.11 kg/m2 Well nourished, well developed, in no acute distress HEENT: normal Neck: no JVD Cardiac:  normal S1, S2; RRR; no murmur Lungs:  clear to auscultation bilaterally, no wheezing, rhonchi or rales Abd: soft, nontender, no  hepatomegalyMildly overweight Ext: no edema Skin: warm and dry Neuro: no focal abnormalities noted  EKG:  03/15/15 - sinus bradycardia rate 51 with no other abnormalities-prior Sinus bradycardia rate 54 with no other abnormalities.     LABS: 03/05/14-LDL 64, triglycerides 199  ASSESSMENT AND PLAN:  1. Coronary artery disease-currently stable, no symptoms. Medications reviewed. ASA 81. Bb. Statin. 2. Old myocardial infarction-inferior. Prior RCA stent. 3. Rheumatoid arthritis-currently being managed by rheumatology. Dr. Trudie Reed. Only one flair up. Toes and fingers. Originally ankle edema. Being treated for gout currently. Able to walk over the past month. Improved. Taking NSAID. We discussed small incremental risk and myocardial infarction with anti-inflammatory chronic use. 4. Hypertension-borderline elevated today. Continue with current medications. Discussed diet, exercise at length. 5. Hyperlipidemia-Crestor. Small LDL increased previously. Continue with current dose. Diet. checking lipid profile, ALT.  6. Obesity-encourage weight loss. He is motivated. He is quite active, hunting area  7. One-year follow-up  Signed, Candee Furbish, MD [facility]  03/15/2015 10:01 AM

## 2015-03-15 NOTE — Patient Instructions (Signed)
Medication Instructions:  Your physician recommends that you continue on your current medications as directed. Please refer to the Current Medication list given to you today.  Labwork: Lipid and ALT today  Follow-Up: Follow up in 1 year with Dr. Marlou Porch.  You will receive a letter in the mail 2 months before you are due.  Please call us when you receive this letter to schedule your follow up appointment.  Thank you for choosing [facility]!!

## 2015-03-15 NOTE — Addendum Note (Signed)
Addended by: Eulis Foster on: 03/15/2015 10:18 AM   Modules accepted: Orders

## 2015-03-31 ENCOUNTER — Other Ambulatory Visit: Payer: Self-pay | Admitting: Cardiology

## 2015-04-01 NOTE — Telephone Encounter (Signed)
Jerline Pain, MD at 03/15/2015 9:46 AM  CRESTOR 20 MG tablet TAKE 1 TABLET BY MOUTH DAILY AT BEDTIME         1. Coronary artery disease-currently stable, no symptoms. Medications reviewed. ASA 81. Bb. Statin  Medication Instructions:  Your physician recommends that you continue on your current medications as directed. Please refer to the Current Medication list given to you today

## 2015-04-13 ENCOUNTER — Other Ambulatory Visit: Payer: Self-pay | Admitting: Cardiology

## 2015-04-30 ENCOUNTER — Other Ambulatory Visit: Payer: Self-pay

## 2015-04-30 MED ORDER — RAMIPRIL 10 MG PO CAPS: 10.0000 mg | ORAL_CAPSULE | Freq: Two times a day (BID) | ORAL | Status: DC

## 2015-05-18 ENCOUNTER — Other Ambulatory Visit: Payer: Self-pay | Admitting: Cardiology

## 2015-09-07 DIAGNOSIS — H903 Sensorineural hearing loss, bilateral: Secondary | ICD-10-CM | POA: Insufficient documentation

## 2015-09-07 DIAGNOSIS — H6123 Impacted cerumen, bilateral: Secondary | ICD-10-CM | POA: Insufficient documentation

## 2015-10-21 ENCOUNTER — Telehealth: Payer: Self-pay | Admitting: Cardiology

## 2015-10-21 NOTE — Telephone Encounter (Signed)
Spoke with pt who reports he is experiencing the symptoms of a head cold that is going to his chest.  Advised he can use coricidin, plain robitussin, saline nasal spray/washes, tylenol.  He states understanding.

## 2015-10-21 NOTE — Telephone Encounter (Signed)
New message     What can pt take for a head cold?

## 2016-01-13 ENCOUNTER — Other Ambulatory Visit: Payer: Self-pay | Admitting: Cardiology

## 2016-02-29 ENCOUNTER — Telehealth: Payer: Self-pay | Admitting: Cardiology

## 2016-02-29 NOTE — Telephone Encounter (Signed)
Spoke with pt about his medications and BP.  He states Dr Virgilio Belling office checked his BP and told him to take 1/2 dose of amlodipine and HCTZ (he thinks) already.  Advised we were not aware that Dr Alroy Dust had given him instructions already however Dr Marlou Porch recommendation is to stop both of these medications.  Pt states understanding and will continue to monitor his BP between now and the time of his appt with Dr Marlou Porch on May 16.

## 2016-02-29 NOTE — Telephone Encounter (Signed)
Pt went to [employer] and lost 22 lbs in 6 weeks, about a week ago he felt dizzy and BP was 108/63-called Dr. Virgilio Belling office and was told to call our office -pls advise [phone]

## 2016-02-29 NOTE — Telephone Encounter (Signed)
Attempted to call pt but NA and mailbox is full.  Reviewed with Dr Marlou Porch who gave instructions to discontinue Amlodipine and HCTZ.  Will continue to attempt to contact pt.

## 2016-03-14 ENCOUNTER — Ambulatory Visit (INDEPENDENT_AMBULATORY_CARE_PROVIDER_SITE_OTHER): Payer: BLUE CROSS/BLUE SHIELD | Admitting: Cardiology

## 2016-03-14 ENCOUNTER — Encounter: Payer: Self-pay | Admitting: Cardiology

## 2016-03-14 VITALS — BP 126/86 | HR 52 | Ht 68.0 in | Wt 177.8 lb

## 2016-03-14 DIAGNOSIS — I252 Old myocardial infarction: Secondary | ICD-10-CM | POA: Diagnosis not present

## 2016-03-14 DIAGNOSIS — E785 Hyperlipidemia, unspecified: Secondary | ICD-10-CM | POA: Diagnosis not present

## 2016-03-14 DIAGNOSIS — I10 Essential (primary) hypertension: Secondary | ICD-10-CM

## 2016-03-14 MED ORDER — METOPROLOL SUCCINATE ER 25 MG PO TB24: 25.0000 mg | ORAL_TABLET | Freq: Every day | ORAL | Status: DC

## 2016-03-14 NOTE — Patient Instructions (Addendum)
Medication Instructions:  Please decrease your Metoprolol to 25 mg a day. Continue all other medications as listed.  Follow-Up: Follow up in 1 year with Dr. Marlou Porch.  You will receive a letter in the mail 2 months before you are due.  Please call us when you receive this letter to schedule your follow up appointment.  If you need a refill on your cardiac medications before your next appointment, please call your pharmacy.  Thank you for choosing [facility]!!

## 2016-03-14 NOTE — Progress Notes (Signed)
[facility]. [address], [address], [place]  [address] Phone: [phone] Fax:  [phone]  Date:  03/14/2016   ID:  Robert Reilly, DOB [birth year], MRN [id number]  PCP:  Donnie Coffin, MD   History of Present Illness: Robert Reilly is a 75 y.o. male with coronary artery disease post myocardial infarction in 2004 with stent to RCA who was seen secondary to elevated blood pressure. recently after weight loss, his blood pressure has decreased significantly. In fact, he is off of amlodipine and HCTZ.   Nuc- inferior infarct with no ischemia, normal EF on 01/10/11. No change from prior.   His wife had a repeat shunt procedure at [facility].  He has been diagnosed with Rheumatoid arthritis, pseudogout. Ankle pain. Swelling. This was prior to amlodipine. Meloxicam has helped. Toe improved.   At home previously having diasotlics of [id number] at times. 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  Had cataract surgery done. Had brief episode of chest discomfort lasting approximally one hour that was fleeting, no further episodes. Worried him briefly, moderate in severity.  He is interested in trying to come off of some of the medications. I discussed this with him. I discussed the importance of secondary prevention.  Admits to missing the Crestor once a week. Small LDL 846. He is motivated for walking, exercise. He admits that he does a lot of walking during hunting season but recently has been renovating his house and this was stopped. Enjoyed a [employer] Gin and tonic here and there.   He had some hypotension after weight loss 20 pounds with [employer]. We have discontinued his HCTZ and amlodipine. Dr. Alroy Dust is aware as well. He feels better after discontinuation of these medicines. I have also pulled back on his Toprol, see below    Wt Readings from Last 3 Encounters:  03/14/16 177 lb 12.8 oz (80.65 kg)  03/15/15 198 lb (89.812 kg)  03/09/14 198 lb (89.812 kg)     Past Medical  History  Diagnosis Date  . Hypercholesteremia   . Coronary atherosclerosis of native coronary artery   . Gout   . Osteoarthritis   . Acute myocardial infarction of other inferior wall, subsequent episode of care   . Other disorder of calcium metabolism   . Old MI (myocardial infarction) 09/10/2013    2004, stent to RCA    No past surgical history on file.  Current Outpatient Prescriptions  Medication Sig Dispense Refill  . allopurinol (ZYLOPRIM) 100 MG tablet Take 100 mg by mouth 2 (two) times daily.   1  . aspirin 81 MG tablet Take 81 mg by mouth daily.    . colchicine 0.6 MG tablet Take 0.6 mg by mouth daily.    . Flaxseed, Linseed, (FLAX SEED OIL) 1000 MG CAPS Take 1 capsule by mouth daily.    . meloxicam (MOBIC) 15 MG tablet Take 15 mg by mouth daily.     . metoprolol succinate (TOPROL-XL) 25 MG 24 hr tablet Take 1 tablet (25 mg total) by mouth daily. Take with or immediately following a meal. 30 tablet 11  . nitroGLYCERIN (NITROSTAT) 0.4 MG SL tablet Place 0.4 mg under the tongue every 5 (five) minutes as needed for chest pain.    Marland Kitchen omeprazole (PRILOSEC) 20 MG capsule TAKE 1 CAPSULE BY MOUTH DAILY. 30 capsule 11  . ramipril (ALTACE) 10 MG capsule Take 1 capsule (10 mg total) by mouth 2 (two) times daily. 60 capsule 12  .  rosuvastatin (CRESTOR) 20 MG tablet TAKE 1 TABLET BY MOUTH DAILY AT BEDTIME. 90 tablet 0  . Trospium Chloride 60 MG CP24 Take 1 tablet by mouth daily.  4   No current facility-administered medications for this visit.    Allergies:    Allergies  Allergen Reactions  . Tetracyclines & Related Rash    Social History:  The patient  reports that he has quit smoking. He does not have any smokeless tobacco history on file. He reports that he drinks about 4.2 oz of alcohol per week. He reports that he does not use illicit drugs.   ROS:  Please see the history of present illness.   Denies any syncope, bleeding, orthopnea, PND   PHYSICAL EXAM: VS:  BP 126/86 mmHg   Pulse 52  Ht 5\' 8"  (1.727 m)  Wt 177 lb 12.8 oz (80.65 kg)  BMI 27.04 kg/m2 Well nourished, well developed, in no acute distress HEENT: normal Neck: no JVD Cardiac:  normal S1, S2; bradycardic, regular; no murmur Lungs:  clear to auscultation bilaterally, no wheezing, rhonchi or rales Abd: soft, nontender, no hepatomegalyMildly overweight Ext: no edema Skin: warm and dry Neuro: no focal abnormalities noted  EKG:  EKG was ordered today: 03/14/16-sinus bradycardia rate 54, vertical axis no other abnormalities personally viewed-prior  03/15/15 - sinus bradycardia rate 51 with no other abnormalities-prior Sinus bradycardia rate 54 with no other abnormalities.     LABS: 03/05/14-LDL 64, triglycerides 199  ASSESSMENT AND PLAN:  1. Coronary artery disease-currently stable, no symptoms. Medications reviewed. ASA 81. Bb. Statin. 2. Old myocardial infarction-inferior. Prior RCA stent. 3. Rheumatoid arthritis-currently being managed by rheumatology. Only one flair up. Toes and fingers. Originally ankle edema. Being treated for gout currently. Able to walk over the past month. Improved. Taking NSAID. We discussed small incremental risk and myocardial infarction with anti-inflammatory chronic use. 4. Since he has lost approximate 20 pounds, his blood pressure has been low. I decided to decrease his Toprol-XL from 50 down to 25 mg. This will also help his bradycardia. We've also stopped his HCTZ as well as amlodipine.  Discussed diet, exercise at length. Enjoying [employer]. 5. Rash-he states that at night he may develop a rash on his upper thigh, upper arm. He is unsure what this is from. Wonders if it is from fruit that he is eating. He will be seeing Dr. Alroy Dust next week. He has been on these blood pressure medications for quite some time without difficulty. 6. Hyperlipidemia-Crestor. Small LDL increased previously. Continue with current dose. Diet.  7. Obesity-great job with weight loss. [employer]  [employer]. He is motivated. He is quite active, hunting. 8. One-year follow-up  Signed, Candee Furbish, MD [facility]  03/14/2016 11:34 AM

## 2016-03-20 DIAGNOSIS — E7800 Pure hypercholesterolemia, unspecified: Secondary | ICD-10-CM | POA: Diagnosis not present

## 2016-03-20 DIAGNOSIS — Z0000 Encounter for general adult medical examination without abnormal findings: Secondary | ICD-10-CM | POA: Diagnosis not present

## 2016-03-28 ENCOUNTER — Encounter: Payer: Self-pay | Admitting: Cardiology

## 2016-04-12 ENCOUNTER — Other Ambulatory Visit: Payer: Self-pay | Admitting: Cardiology

## 2016-05-04 ENCOUNTER — Ambulatory Visit: Payer: Self-pay | Admitting: General Surgery

## 2016-05-04 DIAGNOSIS — K429 Umbilical hernia without obstruction or gangrene: Secondary | ICD-10-CM | POA: Diagnosis not present

## 2016-05-04 NOTE — H&P (Signed)
Robert Reilly. Carbary 05/04/2016 9:26 AM Location: [facility] Patient #: [id number] DOB: 12-28-1940 Married / Language: Cleophus Reilly / Race: White Male  History of Present Illness Robert Reilly; 05/04/2016 10:06 AM) Patient words: Hernia.  The patient is a 75 year old male.   Note:He is self referred. He has a long-standing umbilical hernia that has become symptomatic with respect to discomfort over the past year and a half. His discomfort seems to occur after as been walking a long time or after he eats. Sometimes he has to manually reduce the hernia to alleviate his symptoms. No difficulty with urination or constipation. He is very active and walks a lot. He has recently lost a significant amount of weight as well. He is interested in discussing repair of the hernia.  Other Problems ([name], LPN; 075-GRM D34-534 AM) Arthritis Congestive Heart Failure High blood pressure Hypercholesterolemia Melanoma Myocardial infarction  Past Surgical History ([name], LPN; 075-GRM D34-534 AM) Shoulder Surgery Left. Vasectomy  Diagnostic Studies History ([name], LPN; 075-GRM D34-534 AM) Colonoscopy 5-10 years ago  Allergies ([name], LPN; 075-GRM [id number] AM) Fish Oil *CHEMICALS* Tetracyclines & Related  Medication History ([name], LPN; 075-GRM [id number] AM) Altace (10MG  Capsule, Oral two times daily) Active. Omeprazole (20MG  Packet, Oral daily) Active. Trospium Chloride ER (60MG  Capsule ER 24HR, Oral daily) Active. Flaxseed (daily) Active. Allopurinol (100MG  Tablet, Oral daily) Active. Aspirin (81MG  Tablet, Oral daily) Active. Colcrys (0.6MG  Tablet, Oral daily) Active. Crestor (20MG  Tablet, Oral four times daily, as needed) Active. Meloxicam (15MG  Tablet, Oral daily) Active. Medications Reconciled  Social History ([name], LPN; 075-GRM D34-534 AM) Alcohol use Moderate alcohol use. Caffeine use Coffee. No drug  use Tobacco use Former smoker.  Family History Robert Borer, LPN; 075-GRM D34-534 AM) Arthritis Son. Cancer Father. Cerebrovascular Accident Mother. Hypertension Mother. Melanoma Mother.     Review of Systems ([name] LPN; 075-GRM D34-534 AM) General Present- Weight Loss. Not Present- Appetite Loss, Chills, Fatigue, Fever, Night Sweats and Weight Gain. Skin Present- Hives. Not Present- Change in Wart/Mole, Dryness, Jaundice, New Lesions, Non-Healing Wounds, Rash and Ulcer. HEENT Present- Hearing Loss and Wears glasses/contact lenses. Not Present- Earache, Hoarseness, Nose Bleed, Oral Ulcers, Ringing in the Ears, Seasonal Allergies, Sinus Pain, Sore Throat, Visual Disturbances and Yellow Eyes. Respiratory Not Present- Bloody sputum, Chronic Cough, Difficulty Breathing, Snoring and Wheezing. Breast Not Present- Breast Mass, Breast Pain, Nipple Discharge and Skin Changes. Cardiovascular Not Present- Chest Pain, Difficulty Breathing Lying Down, Leg Cramps, Palpitations, Rapid Heart Rate, Shortness of Breath and Swelling of Extremities. Gastrointestinal Not Present- Abdominal Pain, Bloating, Bloody Stool, Change in Bowel Habits, Chronic diarrhea, Constipation, Difficulty Swallowing, Excessive gas, Gets full quickly at meals, Hemorrhoids, Indigestion, Nausea, Rectal Pain and Vomiting. Male Genitourinary Present- Urine Leakage. Not Present- Blood in Urine, Change in Urinary Stream, Frequency, Impotence, Nocturia, Painful Urination and Urgency. Musculoskeletal Not Present- Back Pain, Joint Pain, Joint Stiffness, Muscle Pain, Muscle Weakness and Swelling of Extremities. Neurological Not Present- Decreased Memory, Fainting, Headaches, Numbness, Seizures, Tingling, Tremor, Trouble walking and Weakness. Psychiatric Not Present- Anxiety, Bipolar, Change in Sleep Pattern, Depression, Fearful and Frequent crying. Endocrine Not Present- Cold Intolerance, Excessive Hunger, Hair Changes, Heat  Intolerance, Hot flashes and New Diabetes. Hematology Present- Blood Thinners. Not Present- Easy Bruising, Excessive bleeding, Gland problems, HIV and Persistent Infections.  Vitals ([name] LPN; 075-GRM D34-534 AM) 05/04/2016 9:26 AM Weight: 169.4 lb Height: 68in Body Surface Area: 1.9 m Body Mass Index: 25.76 kg/m  Temp.: 97.19F(Oral)  Pulse:  54 (Regular)  P.OX: 98% (Room air) BP: 160/90 (Sitting, Left Arm, Standard)      Physical Exam Robert Reilly; 05/04/2016 10:08 AM)  The physical exam findings are as follows: Note:General: WDWN in NAD. Pleasant and cooperative.  HEENT: Robert Reilly/AT, no external nasal or ear masses, mucous membranes are moist  EYES: EOMI, no scleral icterus, pupils normal  CV: RRR, no murmur, no edema  CHEST: Breath sounds equal and clear. Respirations nonlabored.  ABDOMEN: Soft, nontender, nondistended, no masses, no organomegaly, active bowel sounds, reducible umbilical bulge with a 2 cm fascial defect.  GU: No bulges.  MUSCULOSKELETAL: FROM, normal station and gait  SKIN: No jaundice.  NEUROLOGIC: Alert and oriented, answers questions appropriately, normal gait and station.  PSYCHIATRIC: Normal mood, affect , and behavior.    Assessment & Plan Robert Reilly; 0000000 [id number] AM)  UMBILICAL HERNIA WITHOUT OBSTRUCTION OR GANGRENE (K42.9) Impression: This is symptomatic and requires intermittent manual reduction to relieve the symptoms. I recommended open repair with mesh and he is agreeable to this.  Plan: Open umbilical hernia repair with mesh. I have discussed the procedure, risks, and aftercare. Risks include but are not limited to bleeding, infection, wound healing problems, anesthesia, recurrence, injury to intra-abdominal organs, cosmetic deformity. He seems to understand and would like to proceed.  Jackolyn Confer, Reilly

## 2016-05-22 ENCOUNTER — Other Ambulatory Visit: Payer: Self-pay | Admitting: Cardiology

## 2016-05-23 NOTE — Patient Instructions (Addendum)
Saahir Gidcumb [place]  05/23/2016   Your procedure is scheduled on: 05/30/16  Report to [facility] Main  Entrance take [place]  elevators to 3rd floor to  [place] at  [place] AM.  Call this number if you have problems the morning of surgery [phone]   Remember: ONLY 1 PERSON MAY GO WITH YOU TO SHORT STAY TO GET  READY MORNING OF [place].  Do not eat food or drink liquids :After Midnight.     Take these medicines the morning of surgery with A SIP OF WATER:  Metoprolol, Omeprazole, Allopurinol May take Nitroglycerin if needed DO NOT TAKE ANY DIABETIC MEDICATIONS DAY OF YOUR SURGERY                               You may not have any metal on your body including hair pins and              piercings  Do not wear jewelry, make-up, lotions, powders or perfumes, deodorant             Do not wear nail polish.  Do not shave  48 hours prior to surgery.              Men may shave face and neck.   Do not bring valuables to the hospital. [place].  Contacts, dentures or bridgework may not be worn into surgery.  Leave suitcase in the car. After surgery it may be brought to your room.     Patients discharged the day of surgery will not be allowed to drive home.  Name and phone number of your driver:   Friend of family  Pennie Banter  Special Instructions: N/A              Please read over the following fact sheets you were given: _____________________________________________________________________             [facility] - Preparing for Surgery Before surgery, you can play an important role.  Because skin is not sterile, your skin needs to be as free of germs as possible.  You can reduce the number of germs on your skin by washing with CHG (chlorahexidine gluconate) soap before surgery.  CHG is an antiseptic cleaner which kills germs and bonds with the skin to continue killing germs even after washing. Please DO NOT  use if you have an allergy to CHG or antibacterial soaps.  If your skin becomes reddened/irritated stop using the CHG and inform your nurse when you arrive at Short Stay. Do not shave (including legs and underarms) for at least 48 hours prior to the first CHG shower.  You may shave your face/neck. Please follow these instructions carefully:  1.  Shower with CHG Soap the night before surgery and the  morning of Surgery.  2.  If you choose to wash your hair, wash your hair first as usual with your  normal  shampoo.  3.  After you shampoo, rinse your hair and body thoroughly to remove the  shampoo.                           4.  Use CHG as you would any  other liquid soap.  You can apply chg directly  to the skin and wash                       Gently with a scrungie or clean washcloth.  5.  Apply the CHG Soap to your body ONLY FROM THE NECK DOWN.   Do not use on face/ open                           Wound or open sores. Avoid contact with eyes, ears mouth and genitals (private parts).                       Wash face,  Genitals (private parts) with your normal soap.             6.  Wash thoroughly, paying special attention to the area where your surgery  will be performed.  7.  Thoroughly rinse your body with warm water from the neck down.  8.  DO NOT shower/wash with your normal soap after using and rinsing off  the CHG Soap.                9.  Pat yourself dry with a clean towel.            10.  Wear clean pajamas.            11.  Place clean sheets on your bed the night of your first shower and do not  sleep with pets. Day of Surgery : Do not apply any lotions/deodorants the morning of surgery.  Please wear clean clothes to the hospital/surgery center.  FAILURE TO FOLLOW THESE INSTRUCTIONS MAY RESULT IN THE CANCELLATION OF YOUR SURGERY PATIENT SIGNATURE_________________________________  NURSE  SIGNATURE__________________________________  ________________________________________________________________________

## 2016-05-23 NOTE — Progress Notes (Signed)
Wendie Chess dr Marlou Porch 5/17 epic

## 2016-05-25 ENCOUNTER — Encounter (HOSPITAL_COMMUNITY)
Admission: RE | Admit: 2016-05-25 | Discharge: 2016-05-25 | Disposition: A | Discharge status: Home / Self Care | Payer: BLUE CROSS/BLUE SHIELD | Source: Ambulatory Visit | Attending: General Surgery | Admitting: General Surgery

## 2016-05-25 ENCOUNTER — Encounter (HOSPITAL_COMMUNITY): Payer: Self-pay

## 2016-05-25 ENCOUNTER — Telehealth: Payer: Self-pay | Admitting: Cardiology

## 2016-05-25 DIAGNOSIS — Z01812 Encounter for preprocedural laboratory examination: Secondary | ICD-10-CM | POA: Insufficient documentation

## 2016-05-25 DIAGNOSIS — K429 Umbilical hernia without obstruction or gangrene: Secondary | ICD-10-CM | POA: Insufficient documentation

## 2016-05-25 DIAGNOSIS — W57XXXA Bitten or stung by nonvenomous insect and other nonvenomous arthropods, initial encounter: Secondary | ICD-10-CM

## 2016-05-25 HISTORY — DX: Malignant (primary) neoplasm, unspecified: C80.1

## 2016-05-25 HISTORY — DX: Gastro-esophageal reflux disease without esophagitis: K21.9

## 2016-05-25 HISTORY — DX: Bitten or stung by nonvenomous insect and other nonvenomous arthropods, initial encounter: W57.XXXA

## 2016-05-25 HISTORY — DX: Personal history of other diseases of the digestive system: Z87.19

## 2016-05-25 HISTORY — DX: Essential (primary) hypertension: I10

## 2016-05-25 LAB — COMPREHENSIVE METABOLIC PANEL
ALT: 23 U/L (ref 17–63)
AST: 25 U/L (ref 15–41)
Albumin: 4.1 g/dL (ref 3.5–5.0)
Alkaline Phosphatase: 46 U/L (ref 38–126)
Anion gap: 6 (ref 5–15)
BUN: 18 mg/dL (ref 6–20)
CO2: 27 mmol/L (ref 22–32)
Calcium: 9 mg/dL (ref 8.9–10.3)
Chloride: 106 mmol/L (ref 101–111)
Creatinine, Ser: 0.93 mg/dL (ref 0.61–1.24)
GFR calc Af Amer: >60 mL/min (ref >60)
GFR calc non Af Amer: >60 mL/min (ref >60)
Glucose, Bld: 98 mg/dL (ref 65–99)
Potassium: 4.1 mmol/L (ref 3.5–5.1)
Sodium: 139 mmol/L (ref 135–145)
Total Bilirubin: 0.7 mg/dL (ref 0.3–1.2)
Total Protein: 6.9 g/dL (ref 6.5–8.1)

## 2016-05-25 LAB — CBC WITH DIFFERENTIAL/PLATELET
Basophils Absolute: 0 10*3/uL (ref 0.0–0.1)
Basophils Relative: 0 %
Eosinophils Absolute: 0.4 10*3/uL (ref 0.0–0.7)
Eosinophils Relative: 6 %
HCT: 45.4 % (ref 39.0–52.0)
Hemoglobin: 14.8 g/dL (ref 13.0–17.0)
Lymphocytes Relative: 23 %
Lymphs Abs: 1.5 10*3/uL (ref 0.7–4.0)
MCH: 31.8 pg (ref 26.0–34.0)
MCHC: 32.6 g/dL (ref 30.0–36.0)
MCV: 97.4 fL (ref 78.0–100.0)
Monocytes Absolute: 0.5 10*3/uL (ref 0.1–1.0)
Monocytes Relative: 8 %
Neutro Abs: 4 10*3/uL (ref 1.7–7.7)
Neutrophils Relative %: 63 %
Platelets: 192 10*3/uL (ref 150–400)
RBC: 4.66 MIL/uL (ref 4.22–5.81)
RDW: 14.3 % (ref 11.5–15.5)
WBC: 6.3 10*3/uL (ref 4.0–10.5)

## 2016-05-25 NOTE — Telephone Encounter (Signed)
Pt called into the office today to report his BP was elevated at his pre-op visit earlier.  He reports it was 185/? This morning and he has been taking it all afternoon and it is still elevated..  Last check he reports it was 173/93.  Advised to stop taking it every few minutes as he is causing himself to be more and more anxious.  He states understanding.  He then tells me he decided to increase his Metoprolol to 50 mg a day like he was on before and re-start that little pink pill (he doesn't know the name of).  Of note his Metoprolol had been decreased to 25 mg a day d/y hypotension.  Advised pt elevated BP is more than likely r/t his anxiety and an increase in medication may not be indicated.  Advised he certainly wouldn't need to restart both medications at once.  Pt states he was told to take his Toprol the AM of his surgery and I did advise this is OK and explained how it increases the strength of the squeeze of his heart which will in turn increase the amount of oxygen to his tissues.  He states understanding of this.  Instructed him I will review this information with Dr Marlou Porch and as I was telling him I will call back tomorrow the call was dropped.   Will review for orders and c/b.

## 2016-05-25 NOTE — Telephone Encounter (Signed)
New message ° ° ° ° ° ° °Pt returning nurse call  °

## 2016-05-25 NOTE — Progress Notes (Signed)
   05/25/16 1024  OBSTRUCTIVE SLEEP APNEA  Have you ever been diagnosed with sleep apnea through a sleep study? No  Do you snore loudly (loud enough to be heard through closed doors)?  0  Do you often feel tired, fatigued, or sleepy during the daytime (such as falling asleep during driving or talking to someone)? 0  Has anyone observed you stop breathing during your sleep? 1  Do you have, or are you being treated for high blood pressure? 1  BMI more than 35 kg/m2? 0  Age > 50 (1-yes) 1  Neck circumference greater than:Male 16 inches or larger, Male 17inches or larger? 1  Male Gender (Yes=1) 1  Obstructive Sleep Apnea Score 5  Score 5 or greater  Results sent to PCP

## 2016-05-26 ENCOUNTER — Other Ambulatory Visit: Payer: Self-pay | Admitting: *Deleted

## 2016-05-26 MED ORDER — NITROGLYCERIN 0.4 MG SL SUBL: 0.4000 mg | SUBLINGUAL_TABLET | SUBLINGUAL | 1 refills | Status: DC | PRN

## 2016-05-26 NOTE — Telephone Encounter (Signed)
Spoke with pt who understands to continue Metoprolol 50 mg once a day and to monitor his BP.  He will c/b if continued questions or concerns.

## 2016-05-26 NOTE — Telephone Encounter (Signed)
I'm fine with him keeping on the 50 mg of metoprolol. Monitor blood pressures Candee Furbish, MD

## 2016-05-26 NOTE — Telephone Encounter (Signed)
Follow-up  Pt was speaking with nurse yesterday about pre-surgery before call dropped and has additional questions.

## 2016-05-30 ENCOUNTER — Encounter (HOSPITAL_COMMUNITY)
Admission: RE | Disposition: A | Discharge status: Home / Self Care | Payer: Self-pay | Source: Ambulatory Visit | Attending: General Surgery

## 2016-05-30 ENCOUNTER — Encounter (HOSPITAL_COMMUNITY): Payer: Self-pay | Admitting: *Deleted

## 2016-05-30 ENCOUNTER — Ambulatory Visit (HOSPITAL_COMMUNITY): Payer: BLUE CROSS/BLUE SHIELD | Admitting: Registered Nurse

## 2016-05-30 ENCOUNTER — Ambulatory Visit (HOSPITAL_COMMUNITY)
Admission: RE | Admit: 2016-05-30 | Discharge: 2016-05-30 | Disposition: A | Discharge status: Home / Self Care | Payer: BLUE CROSS/BLUE SHIELD | Source: Ambulatory Visit | Attending: General Surgery | Admitting: General Surgery

## 2016-05-30 DIAGNOSIS — Z791 Long term (current) use of non-steroidal anti-inflammatories (NSAID): Secondary | ICD-10-CM | POA: Insufficient documentation

## 2016-05-30 DIAGNOSIS — K219 Gastro-esophageal reflux disease without esophagitis: Secondary | ICD-10-CM | POA: Insufficient documentation

## 2016-05-30 DIAGNOSIS — Z79899 Other long term (current) drug therapy: Secondary | ICD-10-CM | POA: Diagnosis not present

## 2016-05-30 DIAGNOSIS — I10 Essential (primary) hypertension: Secondary | ICD-10-CM | POA: Diagnosis not present

## 2016-05-30 DIAGNOSIS — I509 Heart failure, unspecified: Secondary | ICD-10-CM | POA: Insufficient documentation

## 2016-05-30 DIAGNOSIS — E7800 Pure hypercholesterolemia, unspecified: Secondary | ICD-10-CM | POA: Insufficient documentation

## 2016-05-30 DIAGNOSIS — I110 Hypertensive heart disease with heart failure: Secondary | ICD-10-CM | POA: Diagnosis not present

## 2016-05-30 DIAGNOSIS — Z87891 Personal history of nicotine dependence: Secondary | ICD-10-CM | POA: Insufficient documentation

## 2016-05-30 DIAGNOSIS — I2510 Atherosclerotic heart disease of native coronary artery without angina pectoris: Secondary | ICD-10-CM | POA: Diagnosis not present

## 2016-05-30 DIAGNOSIS — I252 Old myocardial infarction: Secondary | ICD-10-CM | POA: Insufficient documentation

## 2016-05-30 DIAGNOSIS — M1990 Unspecified osteoarthritis, unspecified site: Secondary | ICD-10-CM | POA: Insufficient documentation

## 2016-05-30 DIAGNOSIS — Z7982 Long term (current) use of aspirin: Secondary | ICD-10-CM | POA: Diagnosis not present

## 2016-05-30 DIAGNOSIS — K429 Umbilical hernia without obstruction or gangrene: Secondary | ICD-10-CM | POA: Insufficient documentation

## 2016-05-30 HISTORY — PX: UMBILICAL HERNIA REPAIR: SHX196

## 2016-05-30 HISTORY — PX: INSERTION OF MESH: SHX5868

## 2016-05-30 SURGERY — REPAIR, HERNIA, UMBILICAL, ADULT
Anesthesia: General | Site: Abdomen

## 2016-05-30 MED ORDER — FENTANYL CITRATE (PF) 100 MCG/2ML IJ SOLN
INTRAMUSCULAR | Status: DC | PRN
  Administered 2016-05-30 (×2): 50 ug via INTRAVENOUS
  Administered 2016-05-30: 100 ug via INTRAVENOUS
  Administered 2016-05-30: 50 ug via INTRAVENOUS

## 2016-05-30 MED ORDER — ACETAMINOPHEN 325 MG PO TABS: 650.0000 mg | ORAL_TABLET | ORAL | Status: DC | PRN

## 2016-05-30 MED ORDER — MIDAZOLAM HCL 2 MG/2ML IJ SOLN
INTRAMUSCULAR | Status: AC
  Filled 2016-05-30: qty 2

## 2016-05-30 MED ORDER — EPHEDRINE SULFATE 50 MG/ML IJ SOLN
INTRAMUSCULAR | Status: AC
  Filled 2016-05-30: qty 1

## 2016-05-30 MED ORDER — OXYCODONE HCL 5 MG PO TABS
5.0000 mg | ORAL_TABLET | ORAL | Status: DC | PRN
  Administered 2016-05-30: 5 mg via ORAL
  Filled 2016-05-30: qty 1

## 2016-05-30 MED ORDER — SODIUM CHLORIDE 0.9 % IJ SOLN
INTRAMUSCULAR | Status: AC
  Filled 2016-05-30: qty 10

## 2016-05-30 MED ORDER — LIDOCAINE HCL (CARDIAC) 20 MG/ML IV SOLN
INTRAVENOUS | Status: AC
  Filled 2016-05-30: qty 5

## 2016-05-30 MED ORDER — ONDANSETRON HCL 4 MG/2ML IJ SOLN
INTRAMUSCULAR | Status: DC | PRN
  Administered 2016-05-30: 4 mg via INTRAVENOUS

## 2016-05-30 MED ORDER — ACETAMINOPHEN 650 MG RE SUPP
650.0000 mg | RECTAL | Status: DC | PRN
  Filled 2016-05-30: qty 1

## 2016-05-30 MED ORDER — LIDOCAINE HCL (CARDIAC) 20 MG/ML IV SOLN
INTRAVENOUS | Status: DC | PRN
  Administered 2016-05-30: 100 mg via INTRAVENOUS

## 2016-05-30 MED ORDER — CEFAZOLIN SODIUM-DEXTROSE 2-4 GM/100ML-% IV SOLN
INTRAVENOUS | Status: AC
  Filled 2016-05-30: qty 100

## 2016-05-30 MED ORDER — SUGAMMADEX SODIUM 200 MG/2ML IV SOLN
INTRAVENOUS | Status: AC
  Filled 2016-05-30: qty 2

## 2016-05-30 MED ORDER — SUGAMMADEX SODIUM 200 MG/2ML IV SOLN
INTRAVENOUS | Status: DC | PRN
  Administered 2016-05-30: 160 mg via INTRAVENOUS

## 2016-05-30 MED ORDER — ROCURONIUM BROMIDE 100 MG/10ML IV SOLN
INTRAVENOUS | Status: AC
  Filled 2016-05-30: qty 1

## 2016-05-30 MED ORDER — PROPOFOL 10 MG/ML IV BOLUS
INTRAVENOUS | Status: AC
  Filled 2016-05-30: qty 20

## 2016-05-30 MED ORDER — SODIUM CHLORIDE 0.9 % IR SOLN
Status: DC | PRN
  Administered 2016-05-30: 1000 mL

## 2016-05-30 MED ORDER — PROPOFOL 10 MG/ML IV BOLUS
INTRAVENOUS | Status: DC | PRN
  Administered 2016-05-30: 150 mg via INTRAVENOUS

## 2016-05-30 MED ORDER — ROCURONIUM BROMIDE 100 MG/10ML IV SOLN
INTRAVENOUS | Status: DC | PRN
  Administered 2016-05-30: 50 mg via INTRAVENOUS

## 2016-05-30 MED ORDER — MIDAZOLAM HCL 5 MG/5ML IJ SOLN
INTRAMUSCULAR | Status: DC | PRN
  Administered 2016-05-30 (×2): 1 mg via INTRAVENOUS

## 2016-05-30 MED ORDER — CEFAZOLIN SODIUM-DEXTROSE 2-4 GM/100ML-% IV SOLN
2.0000 g | INTRAVENOUS | Status: AC
  Administered 2016-05-30: 2 g via INTRAVENOUS

## 2016-05-30 MED ORDER — LACTATED RINGERS IV SOLN
INTRAVENOUS | Status: DC | PRN
  Administered 2016-05-30 (×2): via INTRAVENOUS

## 2016-05-30 MED ORDER — ONDANSETRON HCL 4 MG/2ML IJ SOLN
INTRAMUSCULAR | Status: AC
  Filled 2016-05-30: qty 2

## 2016-05-30 MED ORDER — FENTANYL CITRATE (PF) 250 MCG/5ML IJ SOLN
INTRAMUSCULAR | Status: AC
  Filled 2016-05-30: qty 5

## 2016-05-30 MED ORDER — OXYCODONE HCL 5 MG PO TABS: 5.0000 mg | ORAL_TABLET | ORAL | 0 refills | Status: DC | PRN

## 2016-05-30 MED ORDER — BUPIVACAINE HCL (PF) 0.5 % IJ SOLN
INTRAMUSCULAR | Status: DC | PRN
  Administered 2016-05-30: 11 mL

## 2016-05-30 MED ORDER — BUPIVACAINE HCL (PF) 0.5 % IJ SOLN
INTRAMUSCULAR | Status: AC
  Filled 2016-05-30: qty 30

## 2016-05-30 MED ORDER — SODIUM CHLORIDE 0.9% FLUSH: 3.0000 mL | INTRAVENOUS | Status: DC | PRN

## 2016-05-30 MED ORDER — MORPHINE SULFATE (PF) 10 MG/ML IV SOLN: 2.0000 mg | INTRAVENOUS | Status: DC | PRN

## 2016-05-30 SURGICAL SUPPLY — 32 items
APL SKNCLS STERI-STRIP NONHPOA (GAUZE/BANDAGES/DRESSINGS) ×1
BENZOIN TINCTURE PRP APPL 2/3 (GAUZE/BANDAGES/DRESSINGS) ×2 IMPLANT
CHLORAPREP W/TINT 26ML (MISCELLANEOUS) ×2 IMPLANT
COVER SURGICAL LIGHT HANDLE (MISCELLANEOUS) ×2 IMPLANT
DECANTER SPIKE VIAL GLASS SM (MISCELLANEOUS) ×2 IMPLANT
DRAPE INCISE IOBAN 66X45 STRL (DRAPES) ×2 IMPLANT
DRAPE LAPAROTOMY T 102X78X121 (DRAPES) ×2 IMPLANT
DRSG TEGADERM 4X4.75 (GAUZE/BANDAGES/DRESSINGS) ×2 IMPLANT
ELECT PENCIL ROCKER SW 15FT (MISCELLANEOUS) ×2 IMPLANT
ELECT REM PT RETURN 9FT ADLT (ELECTROSURGICAL) ×2
ELECTRODE REM PT RTRN 9FT ADLT (ELECTROSURGICAL) ×1 IMPLANT
GAUZE SPONGE 4X4 12PLY STRL (GAUZE/BANDAGES/DRESSINGS) ×1 IMPLANT
GLOVE ECLIPSE 8.0 STRL XLNG CF (GLOVE) ×2 IMPLANT
GLOVE INDICATOR 8.0 STRL GRN (GLOVE) ×2 IMPLANT
GOWN STRL REUS W/TWL XL LVL3 (GOWN DISPOSABLE) ×4 IMPLANT
KIT BASIN OR (CUSTOM PROCEDURE TRAY) ×2 IMPLANT
MESH HERNIA 3X6 (Mesh General) ×1 IMPLANT
NDL HYPO 25X1 1.5 SAFETY (NEEDLE) ×1 IMPLANT
NEEDLE HYPO 25X1 1.5 SAFETY (NEEDLE) ×2 IMPLANT
PACK BASIC VI WITH GOWN DISP (CUSTOM PROCEDURE TRAY) ×2 IMPLANT
SOL PREP POV-IOD 4OZ 10% (MISCELLANEOUS) ×2 IMPLANT
SPONGE LAP 4X18 X RAY DECT (DISPOSABLE) ×2 IMPLANT
STRIP CLOSURE SKIN 1/2X4 (GAUZE/BANDAGES/DRESSINGS) ×2 IMPLANT
SUT MNCRL AB 4-0 PS2 18 (SUTURE) ×2 IMPLANT
SUT NOVA NAB DX-16 0-1 5-0 T12 (SUTURE) ×2 IMPLANT
SUT PROLENE 0 CT 2 (SUTURE) ×2 IMPLANT
SUT VIC AB 3-0 SH 27 (SUTURE) ×2
SUT VIC AB 3-0 SH 27XBRD (SUTURE) ×1 IMPLANT
SYR BULB IRRIGATION 50ML (SYRINGE) ×1 IMPLANT
SYR CONTROL 10ML LL (SYRINGE) ×2 IMPLANT
TOWEL OR 17X26 10 PK STRL BLUE (TOWEL DISPOSABLE) ×2 IMPLANT
YANKAUER SUCT BULB TIP 10FT TU (MISCELLANEOUS) IMPLANT

## 2016-05-30 NOTE — Interval H&P Note (Signed)
History and Physical Interval Note:  05/30/2016 7:26 AM  Robert Reilly  has presented today for surgery, with the diagnosis of umbilical hernia  The various methods of treatment have been discussed with the patient and family. After consideration of risks, benefits and other options for treatment, the patient has consented to  Procedure(s): UMBILICAL HERNIA REPAIR (N/A) INSERTION OF MESH (N/A) as a surgical intervention .  The patient's history has been reviewed, patient examined, no change in status, stable for surgery.  I have reviewed the patient's chart and labs.  Questions were answered to the patient's satisfaction.     Zairah Arista Lenna Sciara

## 2016-05-30 NOTE — Op Note (Signed)
OPERATIVE NOTE-UMBILICAL HERNIA REPAIR  Preoperative diagnosis: Umbilical hernia  Postoperative diagnosis: Same  Procedure: Umbilical hernia repair with mesh.  Surgeon: Jackolyn Confer M.D.  Anesthesia: General plus Marcaine local   Indication:This is a 75 year old male with a long-standing umbilical hernia that has become symptomatic. He now presents for elective repair.  Per his cardiologist, Dr. Marlou Porch, no preop cardiac work up needed.  Technique: He/ was seen in the holding room and then brought to the operating room, placed supine on the operating table, and a general anesthetic was given.  The hair in the periumbilical area was clipped as was necessary.  The periumbilical area was sterilely prepped and draped.  A time out was performed.  Marcaine solution was infiltrated superficially and deep in the periumbilical area. A subumbilical transverse incision was made through the skin and subcutaneous tissue until the fascia was identified. The subcutaneous tissue was mobilized free from the fascia inferiorly and laterally. The umbilical stalk was mobilized and dissected free from the fascia exposing the underlying hernia defect. The hernia sac and tissue within it were reduced back into the abdominal cavity.  The subcutaneous tissue was freed from the fascia for 3-4 cm around the primary defect. The primary defect was then closed with interrupted #1 Novofil sutures. The sutures were left long.  A piece of polypropylene mesh was brought into the field. The primary repair sutures were threaded up through the mesh and tied down anchoring the mesh directly over the primary repair. The periphery of the mesh was then anchored to the fascia with a running 0 Prolene suture to allow for 3-4 cm of mesh overlap. The excess mesh was trimmed and removed.  Hemostasis was adequate at this time. The umbilicus was reimplanted onto the mesh and fascia with 3-0 Vicryl suture. The subcutaneous tissue was closed  over the mesh with a running 3-0 Vicryl suture. The skin was closed with a 4-0 Monocryl subcuticular stitch. Steri-Strips and sterile dressings were applied.  He tolerated the procedure well without any apparent complications and was taken to the recovery room in satisfactory condition.  EBL < 100 cc,

## 2016-05-30 NOTE — Anesthesia Postprocedure Evaluation (Signed)
Anesthesia Post Note  Patient: Robert Reilly  Procedure(s) Performed: Procedure(s) (LRB): UMBILICAL HERNIA REPAIR (N/A) INSERTION OF MESH (N/A)  Patient location during evaluation: PACU Anesthesia Type: General Level of consciousness: awake and alert Pain management: pain level controlled Vital Signs Assessment: post-procedure vital signs reviewed and stable Respiratory status: spontaneous breathing, nonlabored ventilation, respiratory function stable and patient connected to nasal cannula oxygen Cardiovascular status: blood pressure returned to baseline and stable Postop Assessment: no signs of nausea or vomiting Anesthetic complications: no    Last Vitals:  Vitals:   05/30/16 1018 05/30/16 1108  BP: (!) 173/80 (!) 183/87  Pulse:  (!) 50  Resp: 12 14  Temp: 36.8 C 36.4 C    Last Pain:  Vitals:   05/30/16 1108  TempSrc: Oral  PainSc: 2                  Lesha Jager DAVID

## 2016-05-30 NOTE — Anesthesia Procedure Notes (Signed)
Procedure Name: Intubation Date/Time: 05/30/2016 7:43 AM Performed by: Carleene Cooper A Pre-anesthesia Checklist: Patient identified, Timeout performed, Emergency Drugs available, Suction available and Patient being monitored Patient Re-evaluated:Patient Re-evaluated prior to inductionOxygen Delivery Method: Circle system utilized Preoxygenation: Pre-oxygenation with 100% oxygen Intubation Type: IV induction Ventilation: Mask ventilation without difficulty Laryngoscope Size: Mac and 4 Grade View: Grade I Tube type: Oral Tube size: 7.5 mm Number of attempts: 1 Airway Equipment and Method: Stylet Placement Confirmation: ETT inserted through vocal cords under direct vision,  positive ETCO2 and breath sounds checked- equal and bilateral Secured at: 21 cm Tube secured with: Tape Dental Injury: Teeth and Oropharynx as per pre-operative assessment

## 2016-05-30 NOTE — H&P (View-Only) (Signed)
Robert Reilly. Glassberg 05/04/2016 9:26 AM Location: [facility] Patient #: [id number] DOB: 10/12/1941 Married / Language: [name] / Race: White Male  History of Present Illness [name]; 05/04/2016 10:06 AM) Patient words: Hernia.  The patient is a 75 year old male.   Note:He is self referred. He has a long-standing umbilical hernia that has become symptomatic with respect to discomfort over the past year and a half. His discomfort seems to occur after as been walking a long time or after he eats. Sometimes he has to manually reduce the hernia to alleviate his symptoms. No difficulty with urination or constipation. He is very active and walks a lot. He has recently lost a significant amount of weight as well. He is interested in discussing repair of the hernia.  Other Problems ([name], LPN; 075-GRM [id number] AM) Arthritis Congestive Heart Failure High blood pressure Hypercholesterolemia Melanoma Myocardial infarction  Past Surgical History ([name], LPN; 075-GRM [id number] AM) Shoulder Surgery Left. Vasectomy  Diagnostic Studies History ([name], LPN; 075-GRM [id number] AM) Colonoscopy 5-10 years ago  Allergies ([name], LPN; 075-GRM [id number] AM) Fish Oil *CHEMICALS* Tetracyclines & Related  Medication History ([name], LPN; 075-GRM [id number] AM) Altace (10MG  Capsule, Oral two times daily) Active. Omeprazole (20MG  Packet, Oral daily) Active. Trospium Chloride ER (60MG  Capsule ER 24HR, Oral daily) Active. Flaxseed (daily) Active. Allopurinol (100MG  Tablet, Oral daily) Active. Aspirin (81MG  Tablet, Oral daily) Active. Colcrys (0.6MG  Tablet, Oral daily) Active. Crestor (20MG  Tablet, Oral four times daily, as needed) Active. Meloxicam (15MG  Tablet, Oral daily) Active. Medications Reconciled  Social History ([name], LPN; 075-GRM [id number] AM) Alcohol use Moderate alcohol use. Caffeine use Coffee. No drug  use Tobacco use Former smoker.  Family History [name], LPN; 075-GRM [id number] AM) Arthritis Son. Cancer Father. Cerebrovascular Accident Mother. Hypertension Mother. Melanoma Mother.     Review of Systems ([name] LPN; 075-GRM D34-534 AM) General Present- Weight Loss. Not Present- Appetite Loss, Chills, Fatigue, Fever, Night Sweats and Weight Gain. Skin Present- Hives. Not Present- Change in Wart/Mole, Dryness, Jaundice, New Lesions, Non-Healing Wounds, Rash and Ulcer. HEENT Present- Hearing Loss and Wears glasses/contact lenses. Not Present- Earache, Hoarseness, Nose Bleed, Oral Ulcers, Ringing in the Ears, Seasonal Allergies, Sinus Pain, Sore Throat, Visual Disturbances and Yellow Eyes. Respiratory Not Present- Bloody sputum, Chronic Cough, Difficulty Breathing, Snoring and Wheezing. Breast Not Present- Breast Mass, Breast Pain, Nipple Discharge and Skin Changes. Cardiovascular Not Present- Chest Pain, Difficulty Breathing Lying Down, Leg Cramps, Palpitations, Rapid Heart Rate, Shortness of Breath and Swelling of Extremities. Gastrointestinal Not Present- Abdominal Pain, Bloating, Bloody Stool, Change in Bowel Habits, Chronic diarrhea, Constipation, Difficulty Swallowing, Excessive gas, Gets full quickly at meals, Hemorrhoids, Indigestion, Nausea, Rectal Pain and Vomiting. Male Genitourinary Present- Urine Leakage. Not Present- Blood in Urine, Change in Urinary Stream, Frequency, Impotence, Nocturia, Painful Urination and Urgency. Musculoskeletal Not Present- Back Pain, Joint Pain, Joint Stiffness, Muscle Pain, Muscle Weakness and Swelling of Extremities. Neurological Not Present- Decreased Memory, Fainting, Headaches, Numbness, Seizures, Tingling, Tremor, Trouble walking and Weakness. Psychiatric Not Present- Anxiety, Bipolar, Change in Sleep Pattern, Depression, Fearful and Frequent crying. Endocrine Not Present- Cold Intolerance, Excessive Hunger, Hair Changes, Heat  Intolerance, Hot flashes and New Diabetes. Hematology Present- Blood Thinners. Not Present- Easy Bruising, Excessive bleeding, Gland problems, HIV and Persistent Infections.  Vitals ([name] LPN; 075-GRM D34-534 AM) 05/04/2016 9:26 AM Weight: 169.4 lb Height: 68in Body Surface Area: 1.9 m Body Mass Index: 25.76 kg/m  Temp.: 97.16F(Oral)  Pulse:  54 (Regular)  P.OX: 98% (Room air) BP: 160/90 (Sitting, Left Arm, Standard)      Physical Exam [name]; 05/04/2016 10:08 AM)  The physical exam findings are as follows: Note:General: WDWN in NAD. Pleasant and cooperative.  HEENT: [place]/AT, no external nasal or ear masses, mucous membranes are moist  EYES: EOMI, no scleral icterus, pupils normal  CV: RRR, no murmur, no edema  CHEST: Breath sounds equal and clear. Respirations nonlabored.  ABDOMEN: Soft, nontender, nondistended, no masses, no organomegaly, active bowel sounds, reducible umbilical bulge with a 2 cm fascial defect.  GU: No bulges.  MUSCULOSKELETAL: FROM, normal station and gait  SKIN: No jaundice.  NEUROLOGIC: Alert and oriented, answers questions appropriately, normal gait and station.  PSYCHIATRIC: Normal mood, affect , and behavior.    Assessment & Plan [name]; 0000000 [id number] AM)  UMBILICAL HERNIA WITHOUT OBSTRUCTION OR GANGRENE (K42.9) Impression: This is symptomatic and requires intermittent manual reduction to relieve the symptoms. I recommended open repair with mesh and he is agreeable to this.  Plan: Open umbilical hernia repair with mesh. I have discussed the procedure, risks, and aftercare. Risks include but are not limited to bleeding, infection, wound healing problems, anesthesia, recurrence, injury to intra-abdominal organs, cosmetic deformity. He seems to understand and would like to proceed.  Jackolyn Confer, MD

## 2016-05-30 NOTE — Discharge Instructions (Addendum)
General Anesthesia, Adult, Care After Refer to this sheet in the next few weeks. These instructions provide you with information on caring for yourself after your procedure. Your health care provider may also give you more specific instructions. Your treatment has been planned according to current medical practices, but problems sometimes occur. Call your health care provider if you have any problems or questions after your procedure. WHAT TO EXPECT AFTER THE PROCEDURE After the procedure, it is typical to experience:  Sleepiness.  Nausea and vomiting. HOME CARE INSTRUCTIONS  For the first 24 hours after general anesthesia:  Have a responsible person with you.  Do not drive a car. If you are alone, do not take public transportation.  Do not drink alcohol.  Do not take medicine that has not been prescribed by your health care provider.  Do not sign important papers or make important decisions.  You may resume a normal diet and activities as directed by your health care provider.  Change bandages (dressings) as directed.  If you have questions or problems that seem related to general anesthesia, call the hospital and ask for the anesthetist or anesthesiologist on call. SEEK MEDICAL CARE IF:  You have nausea and vomiting that continue the day after anesthesia.  You develop a rash. SEEK IMMEDIATE MEDICAL CARE IF:   You have difficulty breathing.  You have chest pain.  You have any allergic problems.   This information is not intended to replace advice given to you by your health care provider. Make sure you discuss any questions you have with your health care provider.   Document Released: 01/22/2001 Document Revised: 11/06/2014 Document Reviewed: 02/14/2012 Elsevier Interactive Patient Education 2016 [place] _______[facility]  UMBILICAL HERNIA REPAIR: POST OP INSTRUCTIONS  Always review your discharge instruction sheet given to you by  the facility where your surgery was performed. IF YOU HAVE DISABILITY OR FAMILY LEAVE FORMS, YOU MUST BRING THEM TO THE OFFICE FOR PROCESSING.   DO NOT GIVE THEM TO YOUR DOCTOR.  1. A  prescription for pain medication may be given to you upon discharge.  Take your pain medication as prescribed, if needed.  If narcotic pain medicine is not needed, then you may take acetaminophen (Tylenol) or ibuprofen (Advil) as needed. 2. Take your usually prescribed medications unless otherwise directed. 3. If you need a refill on your pain medication, please contact your pharmacy.  They will contact our office to request authorization. Prescriptions will not be filled after 5 pm or on week-ends. 4. You should follow a light diet the first 24 hours after arrival home, such as soup and crackers, etc.  Be sure to include lots of fluids daily.  Resume your normal diet the day after surgery. 5. Most patients will experience some swelling and bruising around the umbilicus or in the groin and scrotum.  Ice packs and reclining will help.  Swelling and bruising can take several days to resolve.  6. It is common to experience some constipation if taking pain medication after surgery.  Increasing fluid intake and taking a stool softener (such as Colace) will usually help or prevent this problem from occurring.  A mild laxative (Milk of Magnesia or Miralax) should be taken according to package directions if there are no bowel movements after 48 hours. 7. Unless discharge instructions indicate otherwise, you may remove your bandages 72 hours after surgery, and you may shower at that time.  You may have steri-strips (small skin tapes) in  place directly over the incision.  These strips should be left on the skin.  If your surgeon used skin glue on the incision, you may shower in 24 hours.  The glue will flake off over the next 2-3 weeks.  Any sutures or staples will be removed at the office during your follow-up visit. a. ACTIVITIES:   You may resume regular (light) daily activities beginning the next day--such as daily self-care, walking, climbing stairs--gradually increasing activities as tolerated.  You may have sexual intercourse when it is comfortable.  Refrain from any heavy lifting or straining for 6 weeks-nothing over 10 pounds.  You may drive when you are no longer taking prescription pain medication, you can comfortably wear a seatbelt, and you can safely maneuver your car and apply brakes. b. RETURN TO WORK:  __________________________________________________________ 8. You should see your doctor in the office for a follow-up appointment approximately 2-3 weeks after your surgery.  Make sure that you call for this appointment within a day or two after you arrive home to insure a convenient appointment time. 9. OTHER INSTRUCTIONS:  __Restart Aspirin on 8/3/17________________________________________________________________________________________________________________________________________________________________________________________  WHEN TO CALL YOUR DOCTOR: 1. Fever over 101.0 2. Inability to urinate 3. Nausea and/or vomiting 4. Extreme swelling or bruising 5. Continued bleeding from incision. 6. Increased pain, redness, or drainage from the incision  The clinic staff is available to answer your questions during regular business hours.  Please dont hesitate to call and ask to speak to one of the nurses for clinical concerns.  If you have a medical emergency, go to the nearest emergency room or call 911.  A surgeon from [facility] is always on call at the hospital   [address], [place], [place], [place]  [address] ?  [address], [place], [place]   [address] [phone] ? [phone] ? FAX [phone] Web site: [email or web address]

## 2016-05-30 NOTE — Transfer of Care (Signed)
Immediate Anesthesia Transfer of Care Note  Patient: Robert Reilly  Procedure(s) Performed: Procedure(s): UMBILICAL HERNIA REPAIR (N/A) INSERTION OF MESH (N/A)  Patient Location: PACU  Anesthesia Type:General  Level of Consciousness: awake, alert  and oriented  Airway & Oxygen Therapy: Patient Spontanous Breathing and Patient connected to face mask oxygen  Post-op Assessment: Report given to RN and Post -op Vital signs reviewed and stable  Post vital signs: Reviewed and stable  Last Vitals:  Vitals:   05/30/16 0540  BP: (!) 164/87  Pulse: (!) 54  Resp: 16  Temp: 36.7 C    Last Pain:  Vitals:   05/30/16 0540  TempSrc: Oral         Complications: No apparent anesthesia complications

## 2016-05-30 NOTE — Anesthesia Preprocedure Evaluation (Addendum)
Anesthesia Evaluation  Patient identified by MRN, date of birth, ID band Patient awake    Reviewed: Allergy & Precautions, NPO status , Patient's Chart, lab work & pertinent test results  Airway Mallampati: I  TM Distance: >3 FB Neck ROM: Full    Dental   Pulmonary former smoker,    Pulmonary exam normal        Cardiovascular hypertension, Pt. on medications + CAD, + Past MI and + Cardiac Stents  Normal cardiovascular exam     Neuro/Psych    GI/Hepatic GERD  Medicated and Controlled,  Endo/Other    Renal/GU      Musculoskeletal   Abdominal   Peds  Hematology   Anesthesia Other Findings   Reproductive/Obstetrics                            Anesthesia Physical Anesthesia Plan  ASA: III  Anesthesia Plan: General   Post-op Pain Management:    Induction: Intravenous  Airway Management Planned: Oral ETT  Additional Equipment:   Intra-op Plan:   Post-operative Plan: Extubation in OR  Informed Consent: I have reviewed the patients History and Physical, chart, labs and discussed the procedure including the risks, benefits and alternatives for the proposed anesthesia with the patient or authorized representative who has indicated his/her understanding and acceptance.     Plan Discussed with: CRNA and Surgeon  Anesthesia Plan Comments:         Anesthesia Quick Evaluation

## 2016-06-12 ENCOUNTER — Telehealth: Payer: Self-pay | Admitting: Cardiology

## 2016-06-12 NOTE — Telephone Encounter (Signed)
Returning A Call . Thanks

## 2016-06-12 NOTE — Telephone Encounter (Signed)
Per documentation - the pt was called this morning to be reminded of his appt scheduled with [name] 8/16 at 2:30pm.  There was no answer/vm was left by glc. No one else has called the patient.

## 2016-06-14 ENCOUNTER — Encounter: Payer: Self-pay | Admitting: Nurse Practitioner

## 2016-06-14 ENCOUNTER — Ambulatory Visit (INDEPENDENT_AMBULATORY_CARE_PROVIDER_SITE_OTHER): Payer: BLUE CROSS/BLUE SHIELD | Admitting: Nurse Practitioner

## 2016-06-14 ENCOUNTER — Encounter (INDEPENDENT_AMBULATORY_CARE_PROVIDER_SITE_OTHER): Payer: Self-pay

## 2016-06-14 VITALS — BP 160/94 | HR 56 | Ht 68.5 in | Wt 165.1 lb

## 2016-06-14 DIAGNOSIS — E785 Hyperlipidemia, unspecified: Secondary | ICD-10-CM | POA: Diagnosis not present

## 2016-06-14 DIAGNOSIS — I10 Essential (primary) hypertension: Secondary | ICD-10-CM

## 2016-06-14 DIAGNOSIS — I2510 Atherosclerotic heart disease of native coronary artery without angina pectoris: Secondary | ICD-10-CM

## 2016-06-14 MED ORDER — METOPROLOL SUCCINATE ER 50 MG PO TB24: 50.0000 mg | ORAL_TABLET | Freq: Every day | ORAL | 3 refills | Status: DC

## 2016-06-14 MED ORDER — HYDROCHLOROTHIAZIDE 25 MG PO TABS: 25.0000 mg | ORAL_TABLET | Freq: Every day | ORAL | 3 refills | Status: DC

## 2016-06-14 NOTE — Progress Notes (Signed)
CARDIOLOGY OFFICE NOTE  Date:  06/14/2016    Robert Reilly Date of Birth: 30-Dec-1940 Medical Record [id number]  PCP:  Donnie Coffin, MD  Cardiologist:  [facility]   Chief Complaint  Patient presents with  . Hypertension    Work in visit - seen for Dr. Marlou Porch    History of Present Illness: Robert Reilly is a 75 y.o. male who presents today for a work in visit. Seen for Dr. Marlou Porch.   He has a history of coronary artery disease post myocardial infarction in 2004 with stent to RCA. Other issues include HTN and HLD. Other issues include RA. He has had some situational stress with being in the caregiver role for his wife.   Seen back in May and noted that after after weight loss, his blood pressure decreased significantly and was able to be off of amlodipine and HCTZ and a lower dose of beta blocker.   Comes in today. Here alone. He notes that since he has gone on Weight Watcher's he has gotten more salt. He stopped checking his BP shortly after his medicines were stopped then went for a preop for a hernia surgery and BP sky high. Started checking at home and noted that it has stayed high. He admits to eating more salt since being on the diet. Otherwise, he feels fine. Toprol was already increased back up to 50 mg a day.   Past Medical History:  Diagnosis Date  . Acute myocardial infarction of other inferior wall, subsequent episode of care   . Cancer ([place])    melenoma  back , left arm   4-5 yrs ago  . Coronary atherosclerosis of native coronary artery   . GERD (gastroesophageal reflux disease)   . Gout   . History of hiatal hernia   . Hypercholesteremia   . Hypertension   . Old MI (myocardial infarction) 09/10/2013   2004, stent to RCA  . Osteoarthritis   . Other disorder of calcium metabolism   . Tick bite 05/25/2016   states has had 5 in past month    Past Surgical History:  Procedure Laterality Date  . CARDIAC CATHETERIZATION    . CORONARY ANGIOPLASTY     stent x 1     12-15 yrs ago  . EYE SURGERY Bilateral    cataracts  . INSERTION OF MESH N/A 05/30/2016   Procedure: INSERTION OF MESH;  Surgeon: Jackolyn Confer, MD;  Location: [facility];  Service: General;  Laterality: N/A;  . MELANOMA EXCISION     back, left arm  . SHOULDER ARTHROSCOPY WITH ROTATOR CUFF REPAIR Left   . UMBILICAL HERNIA REPAIR N/A 05/30/2016   Procedure: UMBILICAL HERNIA REPAIR;  Surgeon: Jackolyn Confer, MD;  Location: [facility];  Service: General;  Laterality: N/A;     Medications: Current Outpatient Prescriptions  Medication Sig Dispense Refill  . allopurinol (ZYLOPRIM) 100 MG tablet Take 100 mg by mouth 2 (two) times daily.   1  . colchicine 0.6 MG tablet Take 0.6 mg by mouth every other day.     . Flaxseed, Linseed, (FLAX SEED OIL) 1000 MG CAPS Take 1,000 mg by mouth 2 (two) times daily.     . meloxicam (MOBIC) 15 MG tablet Take 15 mg by mouth daily.     . metoprolol succinate (TOPROL-XL) 50 MG 24 hr tablet Take 1 tablet (50 mg total) by mouth daily. 90 tablet 3  . nitroGLYCERIN (NITROSTAT) 0.4 MG SL tablet Place 1 tablet (0.4 mg  total) under the tongue every 5 (five) minutes as needed for chest pain. 25 tablet 1  . omeprazole (PRILOSEC) 20 MG capsule Take 20 mg by mouth daily.    Marland Kitchen oxyCODONE (OXY IR/ROXICODONE) 5 MG immediate release tablet Take 1-2 tablets (5-10 mg total) by mouth every 4 (four) hours as needed for moderate pain, severe pain or breakthrough pain. 40 tablet 0  . ramipril (ALTACE) 10 MG capsule TAKE 1 CAPSULE (10 MG TOTAL) BY MOUTH 2 TIMES DAILY. 60 capsule 9  . rosuvastatin (CRESTOR) 20 MG tablet Take 20 mg by mouth at bedtime.    . Trospium Chloride 60 MG CP24 Take 60 mg by mouth daily.   4  . amLODipine (NORVASC) 5 MG tablet Take 5 mg by mouth daily. 05/25/16   PT STATES NO LONGER TAKING THIS  11  . hydrochlorothiazide (HYDRODIURIL) 25 MG tablet Take 1 tablet (25 mg total) by mouth daily. 90 tablet 3   No current facility-administered medications for this visit.      Allergies: Allergies  Allergen Reactions  . Tetracyclines & Related Rash    Social History: The patient  reports that he quit smoking about 15 years ago. His smoking use included Cigarettes. He has quit using smokeless tobacco. He reports that he drinks alcohol. He reports that he does not use drugs.   Family History: The patient's family history is not on file.   Review of Systems: Please see the history of present illness.   Otherwise, the review of systems is positive for none.   All other systems are reviewed and negative.   Physical Exam: VS:  BP (!) 160/94   Pulse (!) 56   Ht 5' 8.5" (1.74 m)   Wt 165 lb 1.9 oz (74.9 kg)   BMI 24.74 kg/m  .  BMI Body mass index is 24.74 kg/m.  Wt Readings from Last 3 Encounters:  06/14/16 165 lb 1.9 oz (74.9 kg)  05/30/16 164 lb (74.4 kg)  05/25/16 164 lb (74.4 kg)    General: Pleasant. Well developed, well nourished and in no acute distress.   HEENT: Normal.  Neck: Supple, no JVD, carotid bruits, or masses noted.  Cardiac: Regular rate and rhythm. No murmurs, rubs, or gallops. No edema.  Respiratory:  Lungs are clear to auscultation bilaterally with normal work of breathing.  GI: Soft and nontender.  MS: No deformity or atrophy. Gait and ROM intact.  Skin: Warm and dry. Color is normal.  Neuro:  Strength and sensation are intact and no gross focal deficits noted.  Psych: Alert, appropriate and with normal affect.   LABORATORY DATA:  EKG:  EKG is not ordered today.  Lab Results  Component Value Date   WBC 6.3 05/25/2016   HGB 14.8 05/25/2016   HCT 45.4 05/25/2016   PLT 192 05/25/2016   GLUCOSE 98 05/25/2016   CHOL 146 03/15/2015   TRIG 216.0 (H) 03/15/2015   HDL 40.10 03/15/2015   LDLDIRECT 76.0 03/15/2015   LDLCALC 64 03/05/2014   ALT 23 05/25/2016   AST 25 05/25/2016   NA 139 05/25/2016   K 4.1 05/25/2016   CL 106 05/25/2016   CREATININE 0.93 05/25/2016   BUN 18 05/25/2016   CO2 27 05/25/2016    BNP  (last 3 results) No results for input(s): BNP in the last 8760 hours.  ProBNP (last 3 results) No results for input(s): PROBNP in the last 8760 hours.   Other Studies Reviewed Today:   Assessment/Plan: 1. HTN -  uncontrolled - probably related to salt - not sure he will be able to cut back - adding back HCTZ 25 mg a day. BMET on return. He is to monitor at home. Hold on adding back Norvasc for now.   2. CAD - no chest pain noted.   3. Obesity - with active weight loss.   Current medicines are reviewed with the patient today.  The patient does not have concerns regarding medicines other than what has been noted above.  The following changes have been made:  See above.  Labs/ tests ordered today include:   No orders of the defined types were placed in this encounter.    Disposition:   FU with me in 6 weeks with BMET   Patient is agreeable to this plan and will call if any problems develop in the interim.   Signed: Burtis Junes, RN, ANP-C 06/14/2016 3:12 PM  [facility] [address] [place], [place]  [address] Phone: [phone] Fax: [phone]

## 2016-06-14 NOTE — Patient Instructions (Addendum)
We will be checking the following labs today - NONE   Medication Instructions:    Continue with your current medicines. BUT  I am adding back the HCTZ 25 mg a day    Testing/Procedures To Be Arranged:  N/A  Follow-Up:   See me in 6 weeks with a BMET     Other Special Instructions:   Try to cut back on your salt    If you need a refill on your cardiac medications before your next appointment, please call your pharmacy.   Call the [place] office at [phone] if you have any questions, problems or concerns.

## 2016-06-27 DIAGNOSIS — L821 Other seborrheic keratosis: Secondary | ICD-10-CM | POA: Diagnosis not present

## 2016-06-27 DIAGNOSIS — D225 Melanocytic nevi of trunk: Secondary | ICD-10-CM | POA: Diagnosis not present

## 2016-06-27 DIAGNOSIS — Z85828 Personal history of other malignant neoplasm of skin: Secondary | ICD-10-CM | POA: Diagnosis not present

## 2016-06-27 DIAGNOSIS — D485 Neoplasm of uncertain behavior of skin: Secondary | ICD-10-CM | POA: Diagnosis not present

## 2016-06-27 DIAGNOSIS — L812 Freckles: Secondary | ICD-10-CM | POA: Diagnosis not present

## 2016-06-27 DIAGNOSIS — L820 Inflamed seborrheic keratosis: Secondary | ICD-10-CM | POA: Diagnosis not present

## 2016-06-27 DIAGNOSIS — D1801 Hemangioma of skin and subcutaneous tissue: Secondary | ICD-10-CM | POA: Diagnosis not present

## 2016-06-27 DIAGNOSIS — L509 Urticaria, unspecified: Secondary | ICD-10-CM | POA: Diagnosis not present

## 2016-07-04 ENCOUNTER — Encounter: Payer: Self-pay | Admitting: Nurse Practitioner

## 2016-07-19 ENCOUNTER — Encounter (INDEPENDENT_AMBULATORY_CARE_PROVIDER_SITE_OTHER): Payer: Self-pay

## 2016-07-19 ENCOUNTER — Ambulatory Visit (INDEPENDENT_AMBULATORY_CARE_PROVIDER_SITE_OTHER): Payer: BLUE CROSS/BLUE SHIELD | Admitting: Nurse Practitioner

## 2016-07-19 ENCOUNTER — Encounter: Payer: Self-pay | Admitting: Nurse Practitioner

## 2016-07-19 VITALS — BP 164/100 | HR 58 | Ht 68.5 in | Wt 163.4 lb

## 2016-07-19 DIAGNOSIS — I10 Essential (primary) hypertension: Secondary | ICD-10-CM | POA: Diagnosis not present

## 2016-07-19 DIAGNOSIS — I2510 Atherosclerotic heart disease of native coronary artery without angina pectoris: Secondary | ICD-10-CM | POA: Diagnosis not present

## 2016-07-19 DIAGNOSIS — Z23 Encounter for immunization: Secondary | ICD-10-CM | POA: Diagnosis not present

## 2016-07-19 DIAGNOSIS — E785 Hyperlipidemia, unspecified: Secondary | ICD-10-CM | POA: Diagnosis not present

## 2016-07-19 LAB — BASIC METABOLIC PANEL
BUN: 26 mg/dL — ABNORMAL HIGH (ref 7–25)
CO2: 28 mmol/L (ref 20–31)
Calcium: 9.4 mg/dL (ref 8.6–10.3)
Chloride: 104 mmol/L (ref 98–110)
Creat: 1.04 mg/dL (ref 0.70–1.18)
Glucose, Bld: 85 mg/dL (ref 65–99)
Potassium: 4.2 mmol/L (ref 3.5–5.3)
Sodium: 141 mmol/L (ref 135–146)

## 2016-07-19 MED ORDER — AMLODIPINE BESYLATE 5 MG PO TABS: 5.0000 mg | ORAL_TABLET | Freq: Every day | ORAL | 3 refills | Status: DC

## 2016-07-19 NOTE — Progress Notes (Signed)
CARDIOLOGY OFFICE NOTE  Date:  07/19/2016    Robert Reilly Date of Birth: 1941-03-04 Medical Record [id number]  PCP:  Donnie Coffin, MD  Cardiologist:  Marisa Cyphers    Chief Complaint  Patient presents with  . Hypertension    1 month check - seen for Dr. Marlou Porch    History of Present Illness: Robert Reilly is a 75 y.o. male who presents today for a one month check. Seen for Dr. Marlou Porch.   He has a history of coronary artery disease post myocardial infarction in 2004 with stent to RCA. Other issues include HTN and HLD. Other issues include RA. He has had some situational stress with being in the caregiver role for his wife.   Seen back in May and noted that after after weight loss, his blood pressure decreased significantly and was able to be off of amlodipine and HCTZ and a lower dose of beta blocker.   I saw him a month ago - using more salt - went for a pre op visit for hernia surgery and BP sky high - Toprol increased back up - I added back HCTZ.   Comes in today. Here alone. He feels good. No chest pain. Golfing regularly. Eats out a fair amount due to his wife's situation. He admits he likes popcorn - says you can't eat that without salt. BP still high. He is wondering if he should get back on Norvasc.   Past Medical History:  Diagnosis Date  . Acute myocardial infarction of other inferior wall, subsequent episode of care   . Cancer ([place])    melenoma  back , left arm   4-5 yrs ago  . Coronary atherosclerosis of native coronary artery   . GERD (gastroesophageal reflux disease)   . Gout   . History of hiatal hernia   . Hypercholesteremia   . Hypertension   . Old MI (myocardial infarction) 09/10/2013   2004, stent to RCA  . Osteoarthritis   . Other disorder of calcium metabolism   . Tick bite 05/25/2016   states has had 5 in past month    Past Surgical History:  Procedure Laterality Date  . CARDIAC CATHETERIZATION    . CORONARY ANGIOPLASTY     stent x 1     12-15 yrs ago  . EYE SURGERY Bilateral    cataracts  . INSERTION OF MESH N/A 05/30/2016   Procedure: INSERTION OF MESH;  Surgeon: Jackolyn Confer, MD;  Location: [facility];  Service: General;  Laterality: N/A;  . MELANOMA EXCISION     back, left arm  . SHOULDER ARTHROSCOPY WITH ROTATOR CUFF REPAIR Left   . UMBILICAL HERNIA REPAIR N/A 05/30/2016   Procedure: UMBILICAL HERNIA REPAIR;  Surgeon: Jackolyn Confer, MD;  [facility]: [facility];  Service: General;  Laterality: N/A;     Medications: Current Outpatient Prescriptions  Medication Sig Dispense Refill  . allopurinol (ZYLOPRIM) 100 MG tablet Take 100 mg by mouth 2 (two) times daily.   1  . amLODipine (NORVASC) 5 MG tablet Take 1 tablet (5 mg total) by mouth daily. 90 tablet 3  . colchicine 0.6 MG tablet Take 0.6 mg by mouth every other day.     . Flaxseed, Linseed, (FLAX SEED OIL) 1000 MG CAPS Take 1,000 mg by mouth 2 (two) times daily.     . hydrochlorothiazide (HYDRODIURIL) 25 MG tablet Take 1 tablet (25 mg total) by mouth daily. 90 tablet 3  . meloxicam (MOBIC) 15 MG  tablet Take 15 mg by mouth daily.     . metoprolol succinate (TOPROL-XL) 50 MG 24 hr tablet Take 1 tablet (50 mg total) by mouth daily. 90 tablet 3  . nitroGLYCERIN (NITROSTAT) 0.4 MG SL tablet Place 1 tablet (0.4 mg total) under the tongue every 5 (five) minutes as needed for chest pain. 25 tablet 1  . omeprazole (PRILOSEC) 20 MG capsule Take 20 mg by mouth daily.    Marland Kitchen oxyCODONE (OXY IR/ROXICODONE) 5 MG immediate release tablet Take 1-2 tablets (5-10 mg total) by mouth every 4 (four) hours as needed for moderate pain, severe pain or breakthrough pain. 40 tablet 0  . ramipril (ALTACE) 10 MG capsule TAKE 1 CAPSULE (10 MG TOTAL) BY MOUTH 2 TIMES DAILY. 60 capsule 9  . rosuvastatin (CRESTOR) 20 MG tablet Take 20 mg by mouth at bedtime.    . Trospium Chloride 60 MG CP24 Take 60 mg by mouth daily.   4   No current facility-administered medications for this visit.      Allergies: Allergies  Allergen Reactions  . Tetracyclines & Related Rash    Social History: The patient  reports that he quit smoking about 15 years ago. His smoking use included Cigarettes. He has quit using smokeless tobacco. He reports that he drinks alcohol. He reports that he does not use drugs.   Family History: The patient's family history is not on file.   Review of Systems: Please see the history of present illness.   Otherwise, the review of systems is positive for none.   All other systems are reviewed and negative.   Physical Exam: VS:  BP (!) 164/100   Pulse (!) 58   Ht 5' 8.5" (1.74 m)   Wt 163 lb 6.4 oz (74.1 kg)   SpO2 97% Comment: at rest  BMI 24.48 kg/m  .  BMI Body mass index is 24.48 kg/m.  Wt Readings from Last 3 Encounters:  07/19/16 163 lb 6.4 oz (74.1 kg)  06/14/16 165 lb 1.9 oz (74.9 kg)  05/30/16 164 lb (74.4 kg)   BP by me is 160/100  General: Pleasant. Well developed, well nourished and in no acute distress.   HEENT: Normal. Color ruddy. Neck: Supple, no JVD, carotid bruits, or masses noted.  Cardiac: Regular rate and rhythm. No murmurs, rubs, or gallops. No edema.  Respiratory:  Lungs are clear to auscultation bilaterally with normal work of breathing.  GI: Soft and nontender.  MS: No deformity or atrophy. Gait and ROM intact.  Skin: Warm and dry. Color is normal.  Neuro:  Strength and sensation are intact and no gross focal deficits noted.  Psych: Alert, appropriate and with normal affect.   LABORATORY DATA:  EKG:  EKG is not ordered today.  Lab Results  Component Value Date   WBC 6.3 05/25/2016   HGB 14.8 05/25/2016   HCT 45.4 05/25/2016   PLT 192 05/25/2016   GLUCOSE 98 05/25/2016   CHOL 146 03/15/2015   TRIG 216.0 (H) 03/15/2015   HDL 40.10 03/15/2015   LDLDIRECT 76.0 03/15/2015   LDLCALC 64 03/05/2014   ALT 23 05/25/2016   AST 25 05/25/2016   NA 139 05/25/2016   K 4.1 05/25/2016   CL 106 05/25/2016   CREATININE  0.93 05/25/2016   BUN 18 05/25/2016   CO2 27 05/25/2016    BNP (last 3 results) No results for input(s): BNP in the last 8760 hours.  ProBNP (last 3 results) No results for input(s): PROBNP in  the last 8760 hours.   Other Studies Reviewed Today:   Assessment/Plan: 1. HTN - uncontrolled - probably related to salt - not sure he will be able to cut back - already back HCTZ 25 mg a day. BMET today. Adding back Norvasc today.   2. CAD - no chest pain noted.   3. Obesity - with active weight loss.    Current medicines are reviewed with the patient today.  The patient does not have concerns regarding medicines other than what has been noted above.  The following changes have been made:  See above.  Labs/ tests ordered today include:    Orders Placed This Encounter  Procedures  . Basic metabolic panel     Disposition:   FU with me in 6 weeks.    Patient is agreeable to this plan and will call if any problems develop in the interim.   Signed: Burtis Junes, RN, ANP-C 07/19/2016 12:13 PM  [facility] [address] [place], [place]  [address] Phone: [phone] Fax: [phone]

## 2016-07-19 NOTE — Patient Instructions (Addendum)
We will be checking the following labs today - BMET   Medication Instructions:    Continue with your current medicines. BUT  I am restarting the Norvasc today - this has been sent to your drug store    Testing/Procedures To Be Arranged:  N/A  Follow-Up:   See me in about 6 weeks    Other Special Instructions:   Try to limit the salt more  Keep monitoring your blood pressure for me    If you need a refill on your cardiac medications before your next appointment, please call your pharmacy.   Call the [place] office at [phone] if you have any questions, problems or concerns.

## 2016-07-26 ENCOUNTER — Ambulatory Visit: Payer: BLUE CROSS/BLUE SHIELD | Admitting: Nurse Practitioner

## 2016-07-31 ENCOUNTER — Other Ambulatory Visit: Payer: Self-pay | Admitting: Cardiology

## 2016-08-01 ENCOUNTER — Other Ambulatory Visit: Payer: Self-pay

## 2016-08-01 DIAGNOSIS — M4690 Unspecified inflammatory spondylopathy, site unspecified: Secondary | ICD-10-CM | POA: Diagnosis not present

## 2016-08-01 DIAGNOSIS — Z1589 Genetic susceptibility to other disease: Secondary | ICD-10-CM | POA: Diagnosis not present

## 2016-08-01 DIAGNOSIS — R768 Other specified abnormal immunological findings in serum: Secondary | ICD-10-CM | POA: Diagnosis not present

## 2016-08-01 DIAGNOSIS — M1120 Other chondrocalcinosis, unspecified site: Secondary | ICD-10-CM | POA: Diagnosis not present

## 2016-08-01 MED ORDER — ROSUVASTATIN CALCIUM 20 MG PO TABS: 20.0000 mg | ORAL_TABLET | Freq: Every day | ORAL | 3 refills | Status: DC

## 2016-08-07 DIAGNOSIS — T781XXA Other adverse food reactions, not elsewhere classified, initial encounter: Secondary | ICD-10-CM | POA: Diagnosis not present

## 2016-08-30 ENCOUNTER — Ambulatory Visit: Payer: BLUE CROSS/BLUE SHIELD | Admitting: Nurse Practitioner

## 2016-09-12 ENCOUNTER — Ambulatory Visit: Payer: BLUE CROSS/BLUE SHIELD | Admitting: Nurse Practitioner

## 2016-09-18 ENCOUNTER — Encounter (INDEPENDENT_AMBULATORY_CARE_PROVIDER_SITE_OTHER): Payer: Self-pay

## 2016-09-18 ENCOUNTER — Encounter: Payer: Self-pay | Admitting: Nurse Practitioner

## 2016-09-18 ENCOUNTER — Ambulatory Visit (INDEPENDENT_AMBULATORY_CARE_PROVIDER_SITE_OTHER): Payer: BLUE CROSS/BLUE SHIELD | Admitting: Nurse Practitioner

## 2016-09-18 VITALS — BP 128/80 | HR 64 | Ht 68.5 in | Wt 164.8 lb

## 2016-09-18 DIAGNOSIS — E7800 Pure hypercholesterolemia, unspecified: Secondary | ICD-10-CM | POA: Diagnosis not present

## 2016-09-18 DIAGNOSIS — I10 Essential (primary) hypertension: Secondary | ICD-10-CM

## 2016-09-18 DIAGNOSIS — R252 Cramp and spasm: Secondary | ICD-10-CM

## 2016-09-18 DIAGNOSIS — I2510 Atherosclerotic heart disease of native coronary artery without angina pectoris: Secondary | ICD-10-CM | POA: Diagnosis not present

## 2016-09-18 LAB — BASIC METABOLIC PANEL
BUN: 19 mg/dL (ref 7–25)
CO2: 29 mmol/L (ref 20–31)
Calcium: 9.3 mg/dL (ref 8.6–10.3)
Chloride: 105 mmol/L (ref 98–110)
Creat: 0.99 mg/dL (ref 0.70–1.18)
Glucose, Bld: 84 mg/dL (ref 65–99)
Potassium: 3.9 mmol/L (ref 3.5–5.3)
Sodium: 141 mmol/L (ref 135–146)

## 2016-09-18 LAB — MAGNESIUM: Magnesium: 1.9 mg/dL (ref 1.5–2.5)

## 2016-09-18 NOTE — Progress Notes (Signed)
CARDIOLOGY OFFICE NOTE  Date:  09/18/2016    Robert Reilly Date of Birth: 1941-05-18 Medical Record [id number]  PCP:  Donnie Coffin, MD  Cardiologist:  Marisa Cyphers    Chief Complaint  Patient presents with  . Hypertension    Follow up visit - seen for Dr. Marlou Porch    History of Present Illness: Robert Reilly is a 75 y.o. male who presents today for a follow up visit. Seen for Dr. Marlou Porch.   He has a history of coronary artery disease post myocardial infarction in 2004 with stent to RCA. Other issues include HTN and HLD. Other issues include RA. He has had some situational stress with being in the caregiver role for his wife.   Seen back in May and noted that after after weight loss, his blood pressure decreased significantly and was able to be offof amlodipine and HCTZ and a lower dose of beta blocker.   I saw him in August - using more salt - went for a pre op visit for hernia surgery and BP sky high - Toprol increased back up - I added back HCTZ. Last seen by me back 2 months ago - eating out a lot due to wife's situation. Loves salt. Norvasc added back.   Comes in today. Here alone. He is doing pretty good. BP improved so he stopped checking. Tolerating his medicines. Has some cramps in his hands and feet - not clear why he has. No chest pain. Breathing is good. Not dizzy or lightheaded.   Past Medical History:  Diagnosis Date  . Acute myocardial infarction of other inferior wall, subsequent episode of care   . Cancer ([place])    melenoma  back , left arm   4-5 yrs ago  . Coronary atherosclerosis of native coronary artery   . GERD (gastroesophageal reflux disease)   . Gout   . History of hiatal hernia   . Hypercholesteremia   . Hypertension   . Old MI (myocardial infarction) 09/10/2013   2004, stent to RCA  . Osteoarthritis   . Other disorder of calcium metabolism   . Tick bite 05/25/2016   states has had 5 in past month    Past Surgical History:  Procedure  Laterality Date  . CARDIAC CATHETERIZATION    . CORONARY ANGIOPLASTY     stent x 1    12-15 yrs ago  . EYE SURGERY Bilateral    cataracts  . INSERTION OF MESH N/A 05/30/2016   Procedure: INSERTION OF MESH;  Surgeon: Jackolyn Confer, MD;  [facility];  Service: General;  Laterality: N/A;  . MELANOMA EXCISION     back, left arm  . SHOULDER ARTHROSCOPY WITH ROTATOR CUFF REPAIR Left   . UMBILICAL HERNIA REPAIR N/A 05/30/2016   Procedure: UMBILICAL HERNIA REPAIR;  Surgeon: Jackolyn Confer, MD;  [facility];  Service: General;  Laterality: N/A;     Medications: Current Outpatient Prescriptions  Medication Sig Dispense Refill  . allopurinol (ZYLOPRIM) 100 MG tablet Take 100 mg by mouth 2 (two) times daily.   1  . amLODipine (NORVASC) 5 MG tablet Take 1 tablet (5 mg total) by mouth daily. 90 tablet 3  . colchicine 0.6 MG tablet Take 0.6 mg by mouth every other day.     . Flaxseed, Linseed, (FLAX SEED OIL) 1000 MG CAPS Take 1,000 mg by mouth 2 (two) times daily.     . hydrochlorothiazide (HYDRODIURIL) 25 MG tablet Take 1 tablet (25  mg total) by mouth daily. 90 tablet 3  . meloxicam (MOBIC) 15 MG tablet Take 15 mg by mouth daily.     . metoprolol succinate (TOPROL-XL) 50 MG 24 hr tablet Take 1 tablet (50 mg total) by mouth daily. 90 tablet 3  . nitroGLYCERIN (NITROSTAT) 0.4 MG SL tablet Place 1 tablet (0.4 mg total) under the tongue every 5 (five) minutes as needed for chest pain. 25 tablet 1  . omeprazole (PRILOSEC) 20 MG capsule Take 20 mg by mouth daily.    . ramipril (ALTACE) 10 MG capsule TAKE 1 CAPSULE (10 MG TOTAL) BY MOUTH 2 TIMES DAILY. 60 capsule 9  . rosuvastatin (CRESTOR) 20 MG tablet Take 1 tablet (20 mg total) by mouth at bedtime. 90 tablet 3  . Trospium Chloride 60 MG CP24 Take 60 mg by mouth daily.   4   No current facility-administered medications for this visit.     Allergies: Allergies  Allergen Reactions  . Tetracyclines & Related Rash    Social History: The  patient  reports that he quit smoking about 15 years ago. His smoking use included Cigarettes. He has quit using smokeless tobacco. He reports that he drinks alcohol. He reports that he does not use drugs.   Family History: The patient's family history is not on file.   Review of Systems: Please see the history of present illness.   Otherwise, the review of systems is positive for none.   All other systems are reviewed and negative.   Physical Exam: VS:  BP 128/80   Pulse 64   Ht 5' 8.5" (1.74 m)   Wt 164 lb 12.8 oz (74.8 kg)   BMI 24.69 kg/m  .  BMI Body mass index is 24.69 kg/m.  Wt Readings from Last 3 Encounters:  09/18/16 164 lb 12.8 oz (74.8 kg)  07/19/16 163 lb 6.4 oz (74.1 kg)  06/14/16 165 lb 1.9 oz (74.9 kg)    General: Pleasant. Well developed, well nourished and in no acute distress.   HEENT: Normal. Very ruddy complexion.  Neck: Supple, no JVD, carotid bruits, or masses noted.  Cardiac: Regular rate and rhythm. No murmurs, rubs, or gallops. No edema.  Respiratory:  Lungs are clear to auscultation bilaterally with normal work of breathing.  GI: Soft and nontender.  MS: No deformity or atrophy. Gait and ROM intact.  Skin: Warm and dry. Color is normal.  Neuro:  Strength and sensation are intact and no gross focal deficits noted.  Psych: Alert, appropriate and with normal affect.   LABORATORY DATA:  EKG:  EKG is not ordered today.   Lab Results  Component Value Date   WBC 6.3 05/25/2016   HGB 14.8 05/25/2016   HCT 45.4 05/25/2016   PLT 192 05/25/2016   GLUCOSE 85 07/19/2016   CHOL 146 03/15/2015   TRIG 216.0 (H) 03/15/2015   HDL 40.10 03/15/2015   LDLDIRECT 76.0 03/15/2015   LDLCALC 64 03/05/2014   ALT 23 05/25/2016   AST 25 05/25/2016   NA 141 07/19/2016   K 4.2 07/19/2016   CL 104 07/19/2016   CREATININE 1.04 07/19/2016   BUN 26 (H) 07/19/2016   CO2 28 07/19/2016    BNP (last 3 results) No results for input(s): BNP in the last 8760  hours.  ProBNP (last 3 results) No results for input(s): PROBNP in the last 8760 hours.   Other Studies Reviewed Today:   Assessment/Plan: 1. HTN - much better on his current regimen. [place] lab  today. I have left him on his current regimen.   2. CAD - no chest pain noted. Continue with current regimen.   3. Cramps - check potassium and Mg level today.   Current medicines are reviewed with the patient today.  The patient does not have concerns regarding medicines other than what has been noted above.  The following changes have been made:  See above.  Labs/ tests ordered today include:    Orders Placed This Encounter  Procedures  . Basic metabolic panel  . Magnesium     Disposition:   FU with me in 6 months.   Patient is agreeable to this plan and will call if any problems develop in the interim.   Signed: Burtis Junes, RN, ANP-C 09/18/2016 3:36 PM  [facility] [address] [place], [place]  [address] Phone: [phone] Fax: [phone]

## 2016-09-18 NOTE — Patient Instructions (Addendum)
We will be checking the following labs today - BMET and Mg   Medication Instructions:    Continue with your current medicines.     Testing/Procedures To Be Arranged:  N/A  Follow-Up:   See me in 6 months.     Other Special Instructions:   N/A    If you need a refill on your cardiac medications before your next appointment, please call your pharmacy.   Call the [place] office at [phone] if you have any questions, problems or concerns.

## 2016-12-04 DIAGNOSIS — R350 Frequency of micturition: Secondary | ICD-10-CM | POA: Diagnosis not present

## 2016-12-04 DIAGNOSIS — R3915 Urgency of urination: Secondary | ICD-10-CM | POA: Diagnosis not present

## 2016-12-05 ENCOUNTER — Other Ambulatory Visit: Payer: Self-pay | Admitting: Cardiology

## 2016-12-07 DIAGNOSIS — M4690 Unspecified inflammatory spondylopathy, site unspecified: Secondary | ICD-10-CM | POA: Diagnosis not present

## 2016-12-07 DIAGNOSIS — M1120 Other chondrocalcinosis, unspecified site: Secondary | ICD-10-CM | POA: Diagnosis not present

## 2016-12-07 DIAGNOSIS — M79671 Pain in right foot: Secondary | ICD-10-CM | POA: Diagnosis not present

## 2016-12-07 DIAGNOSIS — R768 Other specified abnormal immunological findings in serum: Secondary | ICD-10-CM | POA: Diagnosis not present

## 2016-12-07 DIAGNOSIS — Z1589 Genetic susceptibility to other disease: Secondary | ICD-10-CM | POA: Diagnosis not present

## 2017-01-24 DIAGNOSIS — H02834 Dermatochalasis of left upper eyelid: Secondary | ICD-10-CM | POA: Diagnosis not present

## 2017-01-24 DIAGNOSIS — H02831 Dermatochalasis of right upper eyelid: Secondary | ICD-10-CM | POA: Diagnosis not present

## 2017-01-24 DIAGNOSIS — H43813 Vitreous degeneration, bilateral: Secondary | ICD-10-CM | POA: Diagnosis not present

## 2017-01-24 DIAGNOSIS — D3131 Benign neoplasm of right choroid: Secondary | ICD-10-CM | POA: Diagnosis not present

## 2017-02-02 ENCOUNTER — Other Ambulatory Visit: Payer: Self-pay | Admitting: Cardiology

## 2017-02-05 DIAGNOSIS — Z85828 Personal history of other malignant neoplasm of skin: Secondary | ICD-10-CM | POA: Diagnosis not present

## 2017-02-05 DIAGNOSIS — L570 Actinic keratosis: Secondary | ICD-10-CM | POA: Diagnosis not present

## 2017-02-05 DIAGNOSIS — L821 Other seborrheic keratosis: Secondary | ICD-10-CM | POA: Diagnosis not present

## 2017-02-05 DIAGNOSIS — Z85820 Personal history of malignant melanoma of skin: Secondary | ICD-10-CM | POA: Diagnosis not present

## 2017-02-05 DIAGNOSIS — L812 Freckles: Secondary | ICD-10-CM | POA: Diagnosis not present

## 2017-02-21 DIAGNOSIS — R768 Other specified abnormal immunological findings in serum: Secondary | ICD-10-CM | POA: Diagnosis not present

## 2017-02-21 DIAGNOSIS — M4690 Unspecified inflammatory spondylopathy, site unspecified: Secondary | ICD-10-CM | POA: Diagnosis not present

## 2017-02-21 DIAGNOSIS — Z1589 Genetic susceptibility to other disease: Secondary | ICD-10-CM | POA: Diagnosis not present

## 2017-02-21 DIAGNOSIS — M1120 Other chondrocalcinosis, unspecified site: Secondary | ICD-10-CM | POA: Diagnosis not present

## 2017-02-21 DIAGNOSIS — M1A09X0 Idiopathic chronic gout, multiple sites, without tophus (tophi): Secondary | ICD-10-CM | POA: Diagnosis not present

## 2017-03-19 ENCOUNTER — Ambulatory Visit (INDEPENDENT_AMBULATORY_CARE_PROVIDER_SITE_OTHER): Payer: BLUE CROSS/BLUE SHIELD | Admitting: Nurse Practitioner

## 2017-03-19 ENCOUNTER — Encounter (INDEPENDENT_AMBULATORY_CARE_PROVIDER_SITE_OTHER): Payer: Self-pay

## 2017-03-19 ENCOUNTER — Encounter: Payer: Self-pay | Admitting: Nurse Practitioner

## 2017-03-19 VITALS — BP 150/90 | HR 55 | Ht 68.5 in | Wt 171.1 lb

## 2017-03-19 DIAGNOSIS — I10 Essential (primary) hypertension: Secondary | ICD-10-CM | POA: Diagnosis not present

## 2017-03-19 DIAGNOSIS — I2510 Atherosclerotic heart disease of native coronary artery without angina pectoris: Secondary | ICD-10-CM

## 2017-03-19 DIAGNOSIS — E7800 Pure hypercholesterolemia, unspecified: Secondary | ICD-10-CM

## 2017-03-19 LAB — LIPID PANEL
Chol/HDL Ratio: 3.2 ratio (ref 0.0–5.0)
Cholesterol, Total: 135 mg/dL (ref 100–199)
HDL: 42 mg/dL (ref >39)
LDL Calculated: 56 mg/dL (ref 0–99)
Triglycerides: 184 mg/dL — ABNORMAL HIGH (ref 0–149)
VLDL Cholesterol Cal: 37 mg/dL (ref 5–40)

## 2017-03-19 LAB — HEPATIC FUNCTION PANEL
ALT: 25 IU/L (ref 0–44)
AST: 19 IU/L (ref 0–40)
Albumin: 3.9 g/dL (ref 3.5–4.8)
Alkaline Phosphatase: 58 IU/L (ref 39–117)
Bilirubin Total: 0.3 mg/dL (ref 0.0–1.2)
Bilirubin, Direct: 0.1 mg/dL (ref 0.00–0.40)
Total Protein: 6.4 g/dL (ref 6.0–8.5)

## 2017-03-19 LAB — BASIC METABOLIC PANEL
BUN/Creatinine Ratio: 23 (ref 10–24)
BUN: 22 mg/dL (ref 8–27)
CO2: 25 mmol/L (ref 18–29)
Calcium: 9.4 mg/dL (ref 8.6–10.2)
Chloride: 101 mmol/L (ref 96–106)
Creatinine, Ser: 0.97 mg/dL (ref 0.76–1.27)
GFR calc Af Amer: 89 mL/min/{1.73_m2} (ref >59)
GFR calc non Af Amer: 77 mL/min/{1.73_m2} (ref >59)
Glucose: 103 mg/dL — ABNORMAL HIGH (ref 65–99)
Potassium: 4.6 mmol/L (ref 3.5–5.2)
Sodium: 141 mmol/L (ref 134–144)

## 2017-03-19 NOTE — Progress Notes (Signed)
CARDIOLOGY OFFICE NOTE  Date:  03/19/2017    Robert Reilly Date of Birth: [birth year] Medical Record #[id number]  PCP:  Alroy Dust, L.Marlou Sa, MD  Cardiologist:  Servando Snare [facility]   Chief Complaint  Patient presents with  . Coronary Artery Disease    6 month check - seen for Dr. Marlou Porch    History of Present Illness: Robert Reilly is a 76 y.o. male who presents today for a 6 month check. Seen for Dr. Marlou Porch.   He has a history of coronary artery disease post myocardial infarction in 2004 with stent to RCA. Other issues include HTN, HLD and RA.  He has had some situational stress with being in the caregiver role for his wife.   Seen back in May of 2017 by Dr. Marlou Porch and noted that after after weight loss, his blood pressure decreased significantly and was able to be offof amlodipine and HCTZ and a lower dose of beta blocker.   I then saw him in August - using more salt - went for a pre op visit for hernia surgery and BP sky high - Toprol increased back up - I added back HCTZ and subsequently Norvase - he eats out most meals due to his wife's situation. Last seen back in November and he was doing ok.    Comes in today. Here alone. He is doing ok. Trouble sleeping but no chest pain. His weight continues to climb - up about 7 pounds by our scales. Knows he "needs to work on it". Lots of recent birthday parties. Has not checked his blood pressure too much but says it has been ok. Just took his medicines about an hour ago.   Past Medical History:  Diagnosis Date  . Acute myocardial infarction of other inferior wall, subsequent episode of care   . Cancer ([place])    melenoma  back , left arm   4-5 yrs ago  . Coronary atherosclerosis of native coronary artery   . GERD (gastroesophageal reflux disease)   . Gout   . History of hiatal hernia   . Hypercholesteremia   . Hypertension   . Old MI (myocardial infarction) 09/10/2013   2004, stent to RCA  . Osteoarthritis   . Other disorder of  calcium metabolism   . Tick bite 05/25/2016   states has had 5 in past month    Past Surgical History:  Procedure Laterality Date  . CARDIAC CATHETERIZATION    . CORONARY ANGIOPLASTY     stent x 1    12-15 yrs ago  . EYE SURGERY Bilateral    cataracts  . INSERTION OF MESH N/A 05/30/2016   Procedure: INSERTION OF MESH;  Surgeon: Jackolyn Confer, MD;  [facility];  Service: General;  Laterality: N/A;  . MELANOMA EXCISION     back, left arm  . SHOULDER ARTHROSCOPY WITH ROTATOR CUFF REPAIR Left   . UMBILICAL HERNIA REPAIR N/A 05/30/2016   Procedure: UMBILICAL HERNIA REPAIR;  Surgeon: Jackolyn Confer, MD;  [facility]: [facility];  Service: General;  Laterality: N/A;     Medications: Current Outpatient Prescriptions  Medication Sig Dispense Refill  . allopurinol (ZYLOPRIM) 100 MG tablet Take 100 mg by mouth 2 (two) times daily.   1  . amLODipine (NORVASC) 5 MG tablet Take 1 tablet (5 mg total) by mouth daily. 90 tablet 3  . colchicine 0.6 MG tablet Take 0.6 mg by mouth every other day.     . Flaxseed, Linseed, (FLAX SEED  OIL) 1000 MG CAPS Take 1,000 mg by mouth 2 (two) times daily.     . meloxicam (MOBIC) 15 MG tablet Take 15 mg by mouth daily.     . metoprolol succinate (TOPROL-XL) 50 MG 24 hr tablet Take 1 tablet (50 mg total) by mouth daily. 90 tablet 3  . nitroGLYCERIN (NITROSTAT) 0.4 MG SL tablet Place 1 tablet (0.4 mg total) under the tongue every 5 (five) minutes as needed for chest pain. 25 tablet 1  . omeprazole (PRILOSEC) 20 MG capsule Take 20 mg by mouth daily.    Marland Kitchen omeprazole (PRILOSEC) 20 MG capsule TAKE 1 CAPSULE BY MOUTH DAILY. 90 capsule 3  . ramipril (ALTACE) 10 MG capsule TAKE 1 CAPSULE BY MOUTH 2 TIMES DAILY. 60 capsule 8  . rosuvastatin (CRESTOR) 20 MG tablet Take 1 tablet (20 mg total) by mouth at bedtime. 90 tablet 3  . Trospium Chloride 60 MG CP24 Take 60 mg by mouth daily.   4  . hydrochlorothiazide (HYDRODIURIL) 25 MG tablet Take 1 tablet (25 mg total) by mouth  daily. 90 tablet 3   No current facility-administered medications for this visit.     Allergies: Allergies  Allergen Reactions  . Tetracyclines & Related Rash    Social History: The patient  reports that he quit smoking about 15 years ago. His smoking use included Cigarettes. He has quit using smokeless tobacco. He reports that he drinks alcohol. He reports that he does not use drugs.   Family History: The patient's family history includes Cancer in his father; Stroke in his mother.   Review of Systems: Please see the history of present illness.   Otherwise, the review of systems is positive for none.   All other systems are reviewed and negative.   Physical Exam: VS:  BP (!) 150/90 (BP Location: Left Arm, Patient Position: Sitting, Cuff Size: Normal)   Pulse (!) 55   Ht 5' 8.5" (1.74 m)   Wt 171 lb 1.9 oz (77.6 kg)   SpO2 99% Comment: at rest  BMI 25.64 kg/m  .  BMI Body mass index is 25.64 kg/m.  Wt Readings from Last 3 Encounters:  03/19/17 171 lb 1.9 oz (77.6 kg)  09/18/16 164 lb 12.8 oz (74.8 kg)  07/19/16 163 lb 6.4 oz (74.1 kg)    General: Pleasant. Well developed, well nourished and in no acute distress.   HEENT: Normal.  Neck: Supple, no JVD, carotid bruits, or masses noted.  Cardiac: Regular rate and rhythm. No murmurs, rubs, or gallops. No edema.  Respiratory:  Lungs are clear to auscultation bilaterally with normal work of breathing.  GI: Soft and nontender.  MS: No deformity or atrophy. Gait and ROM intact.  Skin: Warm and dry. Color is normal.  Neuro:  Strength and sensation are intact and no gross focal deficits noted.  Psych: Alert, appropriate and with normal affect.   LABORATORY DATA:  EKG:  EKG is not ordered today.  Lab Results  Component Value Date   WBC 6.3 05/25/2016   HGB 14.8 05/25/2016   HCT 45.4 05/25/2016   PLT 192 05/25/2016   GLUCOSE 84 09/18/2016   CHOL 146 03/15/2015   TRIG 216.0 (H) 03/15/2015   HDL 40.10 03/15/2015    LDLDIRECT 76.0 03/15/2015   LDLCALC 64 03/05/2014   ALT 23 05/25/2016   AST 25 05/25/2016   NA 141 09/18/2016   K 3.9 09/18/2016   CL 105 09/18/2016   CREATININE 0.99 09/18/2016   BUN 19  09/18/2016   CO2 29 09/18/2016    BNP (last 3 results) No results for input(s): BNP in the last 8760 hours.  ProBNP (last 3 results) No results for input(s): PROBNP in the last 8760 hours.   Other Studies Reviewed Today:   Assessment/Plan:  1. HTN - recheck by me is 140/90 - just took his medicines about an hour ago - has better control at home by his report - I have asked him to continue to monitor. He gets too much salt chronically due to home situation.   2. CAD - no chest pain noted. Continue with current regimen - encouraged CV risk factor modification.   3. Situational stress - unchanged. He is the primary caregiver for his wife.   4. HLD - on statin - needs labs today. Would continue Crestor.   5. RA - followed by Dr. Milly Jakob  6. Weight gain - discussed at length - he knows what he needs to do.   Current medicines are reviewed with the patient today.  The patient does not have concerns regarding medicines other than what has been noted above.  The following changes have been made:  See above.  Labs/ tests ordered today include:    Orders Placed This Encounter  Procedures  . Basic metabolic panel  . Hepatic function panel  . Lipid panel     Disposition:   FU with me in 6 months.   Patient is agreeable to this plan and will call if any problems develop in the interim.   SignedTruitt Merle, NP  03/19/2017 8:16 AM  [facility] [address] [place], [place]  [address] Phone: [phone] Fax: [phone]

## 2017-03-19 NOTE — Patient Instructions (Addendum)
We will be checking the following labs today - BMET, lipids and HPF   Medication Instructions:    Continue with your current medicines.     Testing/Procedures To Be Arranged:  N/A  Follow-Up:   See me in 6 months    Other Special Instructions:   Try to get back on track!    If you need a refill on your cardiac medications before your next appointment, please call your pharmacy.   Call the [place] office at [phone] if you have any questions, problems or concerns.

## 2017-04-10 DIAGNOSIS — Z0000 Encounter for general adult medical examination without abnormal findings: Secondary | ICD-10-CM | POA: Diagnosis not present

## 2017-05-09 ENCOUNTER — Other Ambulatory Visit: Payer: Self-pay | Admitting: Cardiology

## 2017-05-10 ENCOUNTER — Other Ambulatory Visit: Payer: Self-pay | Admitting: *Deleted

## 2017-06-13 ENCOUNTER — Other Ambulatory Visit: Payer: Self-pay | Admitting: Nurse Practitioner

## 2017-07-09 ENCOUNTER — Other Ambulatory Visit: Payer: Self-pay | Admitting: Nurse Practitioner

## 2017-07-30 DIAGNOSIS — Z1589 Genetic susceptibility to other disease: Secondary | ICD-10-CM | POA: Diagnosis not present

## 2017-07-30 DIAGNOSIS — M4690 Unspecified inflammatory spondylopathy, site unspecified: Secondary | ICD-10-CM | POA: Diagnosis not present

## 2017-07-30 DIAGNOSIS — M79671 Pain in right foot: Secondary | ICD-10-CM | POA: Diagnosis not present

## 2017-07-30 DIAGNOSIS — M1120 Other chondrocalcinosis, unspecified site: Secondary | ICD-10-CM | POA: Diagnosis not present

## 2017-08-07 DIAGNOSIS — T781XXD Other adverse food reactions, not elsewhere classified, subsequent encounter: Secondary | ICD-10-CM | POA: Diagnosis not present

## 2017-08-07 DIAGNOSIS — W57XXXA Bitten or stung by nonvenomous insect and other nonvenomous arthropods, initial encounter: Secondary | ICD-10-CM | POA: Diagnosis not present

## 2017-08-21 DIAGNOSIS — Z85828 Personal history of other malignant neoplasm of skin: Secondary | ICD-10-CM | POA: Diagnosis not present

## 2017-08-21 DIAGNOSIS — L570 Actinic keratosis: Secondary | ICD-10-CM | POA: Diagnosis not present

## 2017-08-21 DIAGNOSIS — L821 Other seborrheic keratosis: Secondary | ICD-10-CM | POA: Diagnosis not present

## 2017-08-21 DIAGNOSIS — D2261 Melanocytic nevi of right upper limb, including shoulder: Secondary | ICD-10-CM | POA: Diagnosis not present

## 2017-08-21 DIAGNOSIS — D0339 Melanoma in situ of other parts of face: Secondary | ICD-10-CM | POA: Diagnosis not present

## 2017-08-21 DIAGNOSIS — Z85820 Personal history of malignant melanoma of skin: Secondary | ICD-10-CM | POA: Diagnosis not present

## 2017-08-22 ENCOUNTER — Other Ambulatory Visit: Payer: Self-pay | Admitting: Cardiology

## 2017-09-10 ENCOUNTER — Ambulatory Visit: Payer: BLUE CROSS/BLUE SHIELD | Admitting: Nurse Practitioner

## 2017-09-13 ENCOUNTER — Other Ambulatory Visit: Payer: Self-pay | Admitting: Nurse Practitioner

## 2017-09-24 DIAGNOSIS — D0339 Melanoma in situ of other parts of face: Secondary | ICD-10-CM | POA: Diagnosis not present

## 2017-09-25 DIAGNOSIS — D0339 Melanoma in situ of other parts of face: Secondary | ICD-10-CM | POA: Diagnosis not present

## 2017-10-01 NOTE — Progress Notes (Signed)
CARDIOLOGY OFFICE NOTE  Date:  10/02/2017    Robert Reilly Date of Birth: [birth year] Medical Record #[id number]  PCP:  Alroy Dust, L.Marlou Sa, MD  Cardiologist:  Servando Snare [facility]    Chief Complaint  Patient presents with  . Coronary Artery Disease    Follow up visit - seen for Dr. Marlou Porch    History of Present Illness: Robert Reilly is a 76 y.o. male who presents today for a 7 month check. Seen for Dr. Marlou Porch.   He has a history of coronary artery disease post myocardial infarction in 2004 with stent to RCA. Other issues include HTN, HLD and RA.  He has had some situational stress with being in the caregiver role for his wife.   Seen back in May of 2017 by Dr. Marlou Porch and noted that after after weight loss, his blood pressure decreased significantly and was able to be offof amlodipine and HCTZ and a lower dose of beta blocker.   I then saw him in [place] 2017 - using more salt - went for a pre op visit for hernia surgery and BP sky high - Toprol increased back up - I added back HCTZ and subsequently Norvase - he eats out most meals due to his wife's situation. Last seen back in May of 2018 by me and he was doing ok.    Comes in today. Here alone. Has just had surgery on his face for a melanoma. Stitches out today. Says he is doing fine. He does like salt. Loves popcorn with salt - eats "a ton" at least once a week. Admits to using "probably too much" bourbon/gin. No chest pain. Breathing is good. 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 He was cautioned about this.   Past Medical History:  Diagnosis Date  . Acute myocardial infarction of other inferior wall, subsequent episode of care   . Cancer ([place])    melenoma  back , left arm   4-5 yrs ago  . Coronary atherosclerosis of native coronary artery   . GERD (gastroesophageal reflux disease)   . Gout   . History of hiatal hernia   . Hypercholesteremia   .  Hypertension   . Old MI (myocardial infarction) 09/10/2013   2004, stent to RCA  . Osteoarthritis   . Other disorder of calcium metabolism   . Tick bite 05/25/2016   states has had 5 in past month    Past Surgical History:  Procedure Laterality Date  . CARDIAC CATHETERIZATION    . CORONARY ANGIOPLASTY     stent x 1    12-15 yrs ago  . EYE SURGERY Bilateral    cataracts  . INSERTION OF MESH N/A 05/30/2016   Procedure: INSERTION OF MESH;  Surgeon: Jackolyn Confer, MD;  Location: [facility];  Service: General;  Laterality: N/A;  . MELANOMA EXCISION     back, left arm  . SHOULDER ARTHROSCOPY WITH ROTATOR CUFF REPAIR Left   . UMBILICAL HERNIA REPAIR N/A 05/30/2016   Procedure: UMBILICAL HERNIA REPAIR;  Surgeon: Jackolyn Confer, MD;  Location: [facility];  Service: General;  Laterality: N/A;     Medications: Current Meds  Medication Sig  . allopurinol (ZYLOPRIM) 100 MG tablet Take 100 mg by mouth 2 (two) times daily.   Marland Kitchen amLODipine (NORVASC) 5 MG tablet TAKE 1 TABLET BY MOUTH DAILY.  Marland Kitchen colchicine 0.6 MG tablet Take 0.6  mg by mouth every other day.   . Flaxseed, Linseed, (FLAX SEED OIL) 1000 MG CAPS Take 1,000 mg by mouth 2 (two) times daily.   . hydrochlorothiazide (HYDRODIURIL) 25 MG tablet Take 1 tablet (25 mg total) by mouth daily.  . meloxicam (MOBIC) 15 MG tablet Take 15 mg by mouth daily.   . metoprolol succinate (TOPROL-XL) 50 MG 24 hr tablet TAKE 1 TABLET (50 MG TOTAL) BY MOUTH DAILY.  . nitroGLYCERIN (NITROSTAT) 0.4 MG SL tablet Place 1 tablet (0.4 mg total) under the tongue every 5 (five) minutes as needed for chest pain.  Marland Kitchen omeprazole (PRILOSEC) 20 MG capsule TAKE 1 CAPSULE BY MOUTH DAILY.  . ramipril (ALTACE) 10 MG capsule TAKE 1 CAPSULE BY MOUTH 2 TIMES DAILY.  . rosuvastatin (CRESTOR) 20 MG tablet TAKE 1 TABLET BY MOUTH AT BEDTIME.  . Trospium Chloride 60 MG CP24 Take 60 mg by mouth daily.   . [DISCONTINUED] ramipril (ALTACE) 10 MG capsule TAKE 1 CAPSULE BY MOUTH 2 TIMES DAILY.       Allergies: Allergies  Allergen Reactions  . Tetracyclines & Related Rash    Social History: The patient  reports that he quit smoking about 16 years ago. His smoking use included cigarettes. He has quit using smokeless tobacco. He reports that he drinks alcohol. He reports that he does not use drugs.   Family History: The patient's family history includes Cancer in his father; Stroke in his mother.   Review of Systems: Please see the history of present illness.   Otherwise, the review of systems is positive for none.   All other systems are reviewed and negative.   Physical Exam: VS:  BP (!) 152/88   Pulse (!) 57   Ht 5' 8.5" (1.74 m)   Wt 175 lb (79.4 kg)   BMI 26.22 kg/m  .  BMI Body mass index is 26.22 kg/m.  Wt Readings from Last 3 Encounters:  10/02/17 175 lb (79.4 kg)  03/19/17 171 lb 1.9 oz (77.6 kg)  09/18/16 164 lb 12.8 oz (74.8 kg)   BP recheck by me is 140/90  General: Pleasant. Well developed, well nourished and in no acute distress.   HEENT: Normal. Face ruddy. Dressing in place over the left side of the face.  Neck: Supple, no JVD, carotid bruits, or masses noted.  Cardiac: Regular rate and rhythm. No murmurs, rubs, or gallops. No edema.  Respiratory:  Lungs are clear to auscultation bilaterally with normal work of breathing.  GI: Soft and nontender.  MS: No deformity or atrophy. Gait and ROM intact.  Skin: Warm and dry. Color is normal.  Neuro:  Strength and sensation are intact and no gross focal deficits noted.  Psych: Alert, appropriate and with normal affect.   LABORATORY DATA:  EKG:  EKG is ordered today. This demonstrates sinus bradycardia, inferior Qs, poor R wave progression - unchanged.    Lab Results  Component Value Date   WBC 6.3 05/25/2016   HGB 14.8 05/25/2016   HCT 45.4 05/25/2016   PLT 192 05/25/2016   GLUCOSE 103 (H) 03/19/2017   CHOL 135 03/19/2017   TRIG 184 (H) 03/19/2017   HDL 42 03/19/2017   LDLDIRECT 76.0  03/15/2015   LDLCALC 56 03/19/2017   ALT 25 03/19/2017   AST 19 03/19/2017   NA 141 03/19/2017   K 4.6 03/19/2017   CL 101 03/19/2017   CREATININE 0.97 03/19/2017   BUN 22 03/19/2017   CO2 25 03/19/2017  BNP (last 3 results) No results for input(s): BNP in the last 8760 hours.  ProBNP (last 3 results) No results for input(s): PROBNP in the last 8760 hours.   Other Studies Reviewed Today:   Assessment/Plan: 1. HTN - recheck by me is 140/90 - he has not had his medicines today - reports better BP control at home. No changes with his current regimen. I have asked him to continue to monitor.   2. CAD - no chest pain noted. Continue with current regimen - encouraged CV risk factor modification. He is felt to be doing well clinically.   3. Situational stress - unchanged. He is the primary caregiver for his wife.   4. HLD - on statin - needs labs today. Would continue Crestor.   5. RA - followed by Dr. Milly Jakob  Current medicines are reviewed with the patient today.  The patient does not have concerns regarding medicines other than what has been noted above.  The following changes have been made:  See above.  Labs/ tests ordered today include:    Orders Placed This Encounter  Procedures  . Basic metabolic panel  . Hepatic function panel  . Lipid panel  . EKG 12-Lead     Disposition:   FU with me in 6 months.   Patient is agreeable to this plan and will call if any problems develop in the interim.   SignedTruitt Merle, NP  10/02/2017 10:27 AM  [facility] [address] [place], [place]  [address] Phone: [phone] Fax: [phone]

## 2017-10-02 ENCOUNTER — Ambulatory Visit: Payer: BLUE CROSS/BLUE SHIELD | Admitting: Nurse Practitioner

## 2017-10-02 ENCOUNTER — Encounter (INDEPENDENT_AMBULATORY_CARE_PROVIDER_SITE_OTHER): Payer: Self-pay

## 2017-10-02 ENCOUNTER — Encounter: Payer: Self-pay | Admitting: Nurse Practitioner

## 2017-10-02 VITALS — BP 152/88 | HR 57 | Ht 68.5 in | Wt 175.0 lb

## 2017-10-02 DIAGNOSIS — E7800 Pure hypercholesterolemia, unspecified: Secondary | ICD-10-CM | POA: Diagnosis not present

## 2017-10-02 DIAGNOSIS — I2510 Atherosclerotic heart disease of native coronary artery without angina pectoris: Secondary | ICD-10-CM | POA: Diagnosis not present

## 2017-10-02 DIAGNOSIS — I10 Essential (primary) hypertension: Secondary | ICD-10-CM

## 2017-10-02 LAB — HEPATIC FUNCTION PANEL
ALT: 21 IU/L (ref 0–44)
AST: 23 IU/L (ref 0–40)
Albumin: 4.3 g/dL (ref 3.5–4.8)
Alkaline Phosphatase: 51 IU/L (ref 39–117)
Bilirubin Total: 0.5 mg/dL (ref 0.0–1.2)
Bilirubin, Direct: 0.13 mg/dL (ref 0.00–0.40)
Total Protein: 6.8 g/dL (ref 6.0–8.5)

## 2017-10-02 LAB — BASIC METABOLIC PANEL
BUN/Creatinine Ratio: 19 (ref 10–24)
BUN: 16 mg/dL (ref 8–27)
CO2: 26 mmol/L (ref 20–29)
Calcium: 9.4 mg/dL (ref 8.6–10.2)
Chloride: 103 mmol/L (ref 96–106)
Creatinine, Ser: 0.85 mg/dL (ref 0.76–1.27)
GFR calc Af Amer: 99 mL/min/{1.73_m2} (ref >59)
GFR calc non Af Amer: 85 mL/min/{1.73_m2} (ref >59)
Glucose: 102 mg/dL — ABNORMAL HIGH (ref 65–99)
Potassium: 4.1 mmol/L (ref 3.5–5.2)
Sodium: 142 mmol/L (ref 134–144)

## 2017-10-02 LAB — LIPID PANEL
Chol/HDL Ratio: 2.9 ratio (ref 0.0–5.0)
Cholesterol, Total: 132 mg/dL (ref 100–199)
HDL: 46 mg/dL (ref >39)
LDL Calculated: 65 mg/dL (ref 0–99)
Triglycerides: 106 mg/dL (ref 0–149)
VLDL Cholesterol Cal: 21 mg/dL (ref 5–40)

## 2017-10-02 MED ORDER — RAMIPRIL 10 MG PO CAPS: ORAL_CAPSULE | ORAL | 3 refills | Status: DC

## 2017-10-02 NOTE — Patient Instructions (Addendum)
We will be checking the following labs today - BMET, HPF and lipids   Medication Instructions:    Continue with your current medicines.  I did send in a 3 month supply for your Ramipril     Testing/Procedures To Be Arranged:  N/A  Follow-Up:   See me in 6 months    Other Special Instructions:   N/A    If you need a refill on your cardiac medications before your next appointment, please call your pharmacy.   Call the [place] office at [phone] if you have any questions, problems or concerns.

## 2017-10-10 ENCOUNTER — Emergency Department (HOSPITAL_COMMUNITY)
Admission: EM | Admit: 2017-10-10 | Discharge: 2017-10-11 | Disposition: A | Discharge status: Home / Self Care | Payer: BLUE CROSS/BLUE SHIELD | Attending: Emergency Medicine | Admitting: Emergency Medicine

## 2017-10-10 ENCOUNTER — Encounter (HOSPITAL_COMMUNITY): Payer: Self-pay

## 2017-10-10 ENCOUNTER — Emergency Department (HOSPITAL_COMMUNITY): Payer: BLUE CROSS/BLUE SHIELD

## 2017-10-10 ENCOUNTER — Telehealth: Payer: Self-pay | Admitting: Nurse Practitioner

## 2017-10-10 ENCOUNTER — Other Ambulatory Visit: Payer: Self-pay

## 2017-10-10 DIAGNOSIS — R079 Chest pain, unspecified: Secondary | ICD-10-CM | POA: Insufficient documentation

## 2017-10-10 DIAGNOSIS — Z87891 Personal history of nicotine dependence: Secondary | ICD-10-CM | POA: Insufficient documentation

## 2017-10-10 DIAGNOSIS — Z85828 Personal history of other malignant neoplasm of skin: Secondary | ICD-10-CM | POA: Insufficient documentation

## 2017-10-10 DIAGNOSIS — Z955 Presence of coronary angioplasty implant and graft: Secondary | ICD-10-CM | POA: Insufficient documentation

## 2017-10-10 DIAGNOSIS — I10 Essential (primary) hypertension: Secondary | ICD-10-CM | POA: Insufficient documentation

## 2017-10-10 DIAGNOSIS — I252 Old myocardial infarction: Secondary | ICD-10-CM | POA: Insufficient documentation

## 2017-10-10 DIAGNOSIS — R0789 Other chest pain: Secondary | ICD-10-CM | POA: Diagnosis not present

## 2017-10-10 DIAGNOSIS — Z79899 Other long term (current) drug therapy: Secondary | ICD-10-CM | POA: Insufficient documentation

## 2017-10-10 DIAGNOSIS — I2510 Atherosclerotic heart disease of native coronary artery without angina pectoris: Secondary | ICD-10-CM | POA: Diagnosis not present

## 2017-10-10 DIAGNOSIS — R001 Bradycardia, unspecified: Secondary | ICD-10-CM | POA: Diagnosis not present

## 2017-10-10 LAB — BASIC METABOLIC PANEL
Anion gap: 9 (ref 5–15)
BUN: 17 mg/dL (ref 6–20)
CO2: 28 mmol/L (ref 22–32)
Calcium: 9 mg/dL (ref 8.9–10.3)
Chloride: 105 mmol/L (ref 101–111)
Creatinine, Ser: 1.08 mg/dL (ref 0.61–1.24)
GFR calc Af Amer: >60 mL/min (ref >60)
GFR calc non Af Amer: >60 mL/min (ref >60)
Glucose, Bld: 62 mg/dL — ABNORMAL LOW (ref 65–99)
Potassium: 4 mmol/L (ref 3.5–5.1)
Sodium: 142 mmol/L (ref 135–145)

## 2017-10-10 LAB — CBC
HCT: 44.8 % (ref 39.0–52.0)
Hemoglobin: 14.9 g/dL (ref 13.0–17.0)
MCH: 33.7 pg (ref 26.0–34.0)
MCHC: 33.3 g/dL (ref 30.0–36.0)
MCV: 101.4 fL — ABNORMAL HIGH (ref 78.0–100.0)
Platelets: 229 10*3/uL (ref 150–400)
RBC: 4.42 MIL/uL (ref 4.22–5.81)
RDW: 14.5 % (ref 11.5–15.5)
WBC: 8.8 10*3/uL (ref 4.0–10.5)

## 2017-10-10 LAB — I-STAT TROPONIN, ED: Troponin i, poc: 0 ng/mL (ref 0.00–0.08)

## 2017-10-10 NOTE — Telephone Encounter (Signed)
S/w pt is in car getting ready to go out to eat.  Cecille Rubin stated pt needs to go to hospital  Pt's pain has been off and on q 10 min since this am.  Is having pain that radiates to back at a pain level of 6.  No other symptoms.  Pt did not try a nitro.  Stated will call son to pick pt up and bring to ER.  Was not happy had to go to hospital and wait. Stated did not think this pain was critical.

## 2017-10-10 NOTE — Telephone Encounter (Signed)
New Message  Pt call requesting to speak with RN. Pt states he is having sharp pains in his chest. Pt states he is not having any symptoms, but would like to speak with RN. Pt states his pain level is about a 6. Please call back to discuss

## 2017-10-10 NOTE — Telephone Encounter (Signed)
Agree that with active chest pain rated 6 on scale of 1 to 10 - to go to the ER.  Advised to go to the ER/call 911.

## 2017-10-10 NOTE — ED Triage Notes (Signed)
Pt arrives from home with complaints of central chest pain that radiates to back when he work up. Pt states pain comes and goes and every now and then he gets a shooting pain. Pt denies diaphoresis, n/v, sob, dizziness.

## 2017-10-10 NOTE — ED Provider Notes (Signed)
[facility] EMERGENCY DEPARTMENT Provider Note   CSN: [id number] Arrival date & time: 10/10/17  1422     History   Chief Complaint Chief Complaint  Patient presents with  . Chest Pain    HPI Robert Reilly is a 76 y.o. male.  Patient with a history of stents presents with chest pain that started at 7:30 this morning and was constant til 2:30 and since has been intermittent. No SOB, edema, cough, AP, N, V. History of heartburn but current symptoms are different. The pain radiates into the back.  He did not take anything for pain, including his NTG. Pain stopped at 7:30 this evening.      Past Medical History:  Diagnosis Date  . Acute myocardial infarction of other inferior wall, subsequent episode of care   . Cancer ([place])    melenoma  back , left arm   4-5 yrs ago  . Coronary atherosclerosis of native coronary artery   . GERD (gastroesophageal reflux disease)   . Gout   . History of hiatal hernia   . Hypercholesteremia   . Hypertension   . Old MI (myocardial infarction) 09/10/2013   2004, stent to RCA  . Osteoarthritis   . Other disorder of calcium metabolism   . Tick bite 05/25/2016   states has had 5 in past month    Patient Active Problem List   Diagnosis Date Noted  . Old MI (myocardial infarction) 09/10/2013  . Coronary atherosclerosis of native coronary artery 09/10/2013  . RA (rheumatoid arthritis) ([place]) 09/10/2013  . HTN (hypertension) 09/10/2013    Past Surgical History:  Procedure Laterality Date  . CARDIAC CATHETERIZATION    . CORONARY ANGIOPLASTY     stent x 1    12-15 yrs ago  . EYE SURGERY Bilateral    cataracts  . INSERTION OF MESH N/A 05/30/2016   Procedure: INSERTION OF MESH;  Surgeon: Jackolyn Confer, MD;  Location: [facility];  Service: General;  Laterality: N/A;  . MELANOMA EXCISION     back, left arm  . SHOULDER ARTHROSCOPY WITH ROTATOR CUFF REPAIR Left   . UMBILICAL HERNIA REPAIR N/A 05/30/2016   Procedure: UMBILICAL  HERNIA REPAIR;  Surgeon: Jackolyn Confer, MD;  Location: [facility];  Service: General;  Laterality: N/A;       Home Medications    Prior to Admission medications   Medication Sig Start Date End Date Taking? Authorizing Provider  allopurinol (ZYLOPRIM) 100 MG tablet Take 100 mg by mouth daily.  02/13/15  Yes [provider]  amLODipine (NORVASC) 5 MG tablet TAKE 1 TABLET BY MOUTH DAILY. Patient taking differently: Take 5 mg by mouth once a day 09/13/17  Yes Burtis Junes, NP  colchicine 0.6 MG tablet Take 0.6 mg by mouth daily.    Yes [provider]  hydrochlorothiazide (HYDRODIURIL) 25 MG tablet Take 1 tablet (25 mg total) by mouth daily. 07/09/17 10/10/17 Yes Burtis Junes, NP  metoprolol succinate (TOPROL-XL) 50 MG 24 hr tablet TAKE 1 TABLET (50 MG TOTAL) BY MOUTH DAILY. 06/13/17  Yes Burtis Junes, NP  omeprazole (PRILOSEC) 20 MG capsule TAKE 1 CAPSULE BY MOUTH DAILY. 02/02/17  Yes Jerline Pain, MD  ramipril (ALTACE) 10 MG capsule TAKE 1 CAPSULE BY MOUTH 2 TIMES DAILY. 10/02/17  Yes Burtis Junes, NP  rosuvastatin (CRESTOR) 20 MG tablet TAKE 1 TABLET BY MOUTH AT BEDTIME. 08/22/17  Yes Skains, Thana Farr, MD  Flaxseed, Linseed, (FLAX SEED OIL) 1000 MG  CAPS Take 1,000 mg by mouth 2 (two) times daily.     [provider]  meloxicam (MOBIC) 15 MG tablet Take 15 mg by mouth daily.  09/06/13   [provider]  nitroGLYCERIN (NITROSTAT) 0.4 MG SL tablet Place 1 tablet (0.4 mg total) under the tongue every 5 (five) minutes as needed for chest pain. 05/26/16   Jerline Pain, MD  oxybutynin (DITROPAN) 5 MG tablet Take 5 mg by mouth 2 (two) times daily.    [provider]  Trospium Chloride 60 MG CP24 Take 60 mg by mouth daily.  02/11/16   [provider]    Family History Family History  Problem Relation Age of Onset  . Stroke Mother   . Cancer Father     Social History Social History   Tobacco Use  . Smoking status: Former Smoker     Types: Cigarettes    Last attempt to quit: 05/25/2001    Years since quitting: 16.3  . Smokeless tobacco: Former Network engineer Use Topics  . Alcohol use: Yes    Comment: 2-3 drinks week- gin  . Drug use: No     Allergies   Tetracyclines & related and Other   Review of Systems Review of Systems  Constitutional: Negative for chills, diaphoresis and fever.  HENT: Negative.   Respiratory: Negative.  Negative for shortness of breath.   Cardiovascular: Positive for chest pain. Negative for leg swelling.  Gastrointestinal: Negative.  Negative for nausea.  Musculoskeletal: Negative.   Skin: Negative.   Neurological: Negative.      Physical Exam Updated Vital Signs BP (!) 143/89   Pulse (!) 57   Temp 98 F (36.7 C) (Oral)   Resp 15   SpO2 95%   Physical Exam  Constitutional: He is oriented to person, place, and time. He appears well-developed and well-nourished.  HENT:  Head: Normocephalic.  Neck: Normal range of motion. Neck supple.  Cardiovascular: Normal rate, regular rhythm and intact distal pulses.  Pulmonary/Chest: Effort normal and breath sounds normal. He has no wheezes. He has no rales.  Minimal tenderness to bilateral chest wall.   Abdominal: Soft. Bowel sounds are normal. There is no tenderness. There is no rebound and no guarding.  Musculoskeletal: Normal range of motion.       Right lower leg: He exhibits no edema.       Left lower leg: He exhibits no edema.  Minimal right upper back tenderness without swelling.  Neurological: He is alert and oriented to person, place, and time.  Skin: Skin is warm and dry.  Psychiatric: He has a normal mood and affect.     ED Treatments / Results  Labs (all labs ordered are listed, but only abnormal results are displayed) Labs Reviewed  BASIC METABOLIC PANEL - Abnormal; Notable for the following components:      Result Value   Glucose, Bld 62 (*)    All other components within normal limits  CBC - Abnormal;  Notable for the following components:   MCV 101.4 (*)    All other components within normal limits  I-STAT TROPONIN, ED     EKG  EKG Interpretation  Date/Time:  Wednesday October 10 2017 14:30:54 EST Ventricular Rate:  57 PR Interval:  164 QRS Duration: 88 QT Interval:  406 QTC Calculation: 395 R Axis:   1 Text Interpretation:  Sinus bradycardia Possible Anterior infarct , age undetermined Confirmed by Virgel Manifold [phone]) on 10/10/2017 10:48:18 PM  Radiology Dg Chest 2 View  Result Date: 10/10/2017 CLINICAL DATA:  Chest pain EXAM: CHEST  2 VIEW COMPARISON:  11/11/2012 FINDINGS: Normal heart size. Mild aortic tortuosity. There is no edema, consolidation, effusion, or pneumothorax. Exaggerated thoracic kyphosis. No acute osseous finding. IMPRESSION: No evidence of active disease. Electronically Signed   By: Monte Fantasia M.D.   On: 10/10/2017 16:19    Procedures Procedures (including critical care time)  Medications Ordered in ED Medications - No data to display   Initial Impression / Assessment and Plan / ED Course  I have reviewed the triage vital signs and the nursing notes.  Pertinent labs & imaging results that were available during my care of the patient were reviewed by me and considered in my medical decision making (see chart for details).     Patient presents for evaluation of chest pain that started earlier in the day and resolved around 7:30 pm tonight. Pain inconsistent with ischemia. EKG without acute change. Labs normal.   Patient remains painfree while in the ED. He is seen by Dr. Wilson Singer and found appropriate for discharge home. He will follow up with Dr. Marlou Porch for recheck.     Final Clinical Impressions(s) / ED Diagnoses   Final diagnoses:  None   1. Nonspecific chest pain  ED Discharge Orders    None       Charlann Lange, Hershal Coria 10/14/17 0086    Virgel Manifold, MD 10/16/17 [phone]

## 2017-10-10 NOTE — ED Triage Notes (Signed)
Pt reports he shoveled  Snow on Tuesday  And CP started  This AM. Pt reports he shoveled snow day with the CP.

## 2017-10-10 NOTE — ED Triage Notes (Signed)
Pt reports he had shocking pain in chest that radiated to back.  Pt reported Pain started this AM.

## 2017-10-11 NOTE — ED Notes (Signed)
Pt understood dc material. NAD noted. 

## 2017-10-11 NOTE — ED Provider Notes (Signed)
Medical screening examination/treatment/procedure(s) were conducted as a shared visit with non-physician practitioner(s) and myself.  I personally evaluated the patient during the encounter.   EKG Interpretation  Date/Time:  Wednesday October 10 2017 14:30:54 EST Ventricular Rate:  57 PR Interval:  164 QRS Duration: 88 QT Interval:  406 QTC Calculation: 395 R Axis:   1 Text Interpretation:  Sinus bradycardia Possible Anterior infarct , age undetermined Confirmed by Virgel Manifold [phone]) on 10/10/2017 10:48:18 PM      76yM with CP into back. Sharp/shooting pain "almost like an electric shock." No other associated symptoms. Now symptom free. I doubt ACS, PE, dissection or other emergent process.    Virgel Manifold, MD 10/16/17 1115

## 2017-12-19 DIAGNOSIS — R350 Frequency of micturition: Secondary | ICD-10-CM | POA: Diagnosis not present

## 2017-12-19 DIAGNOSIS — R8271 Bacteriuria: Secondary | ICD-10-CM | POA: Diagnosis not present

## 2017-12-19 DIAGNOSIS — R3915 Urgency of urination: Secondary | ICD-10-CM | POA: Diagnosis not present

## 2017-12-24 DIAGNOSIS — Z6826 Body mass index (BMI) 26.0-26.9, adult: Secondary | ICD-10-CM | POA: Diagnosis not present

## 2017-12-24 DIAGNOSIS — M1120 Other chondrocalcinosis, unspecified site: Secondary | ICD-10-CM | POA: Diagnosis not present

## 2017-12-24 DIAGNOSIS — M4690 Unspecified inflammatory spondylopathy, site unspecified: Secondary | ICD-10-CM | POA: Diagnosis not present

## 2017-12-24 DIAGNOSIS — R768 Other specified abnormal immunological findings in serum: Secondary | ICD-10-CM | POA: Diagnosis not present

## 2017-12-24 DIAGNOSIS — M1A09X0 Idiopathic chronic gout, multiple sites, without tophus (tophi): Secondary | ICD-10-CM | POA: Diagnosis not present

## 2017-12-24 DIAGNOSIS — Z1589 Genetic susceptibility to other disease: Secondary | ICD-10-CM | POA: Diagnosis not present

## 2017-12-26 DIAGNOSIS — N289 Disorder of kidney and ureter, unspecified: Secondary | ICD-10-CM | POA: Diagnosis not present

## 2018-01-12 ENCOUNTER — Other Ambulatory Visit: Payer: Self-pay | Admitting: Cardiology

## 2018-01-29 DIAGNOSIS — H02831 Dermatochalasis of right upper eyelid: Secondary | ICD-10-CM | POA: Diagnosis not present

## 2018-01-29 DIAGNOSIS — H26491 Other secondary cataract, right eye: Secondary | ICD-10-CM | POA: Diagnosis not present

## 2018-01-29 DIAGNOSIS — D3131 Benign neoplasm of right choroid: Secondary | ICD-10-CM | POA: Diagnosis not present

## 2018-01-29 DIAGNOSIS — H43813 Vitreous degeneration, bilateral: Secondary | ICD-10-CM | POA: Diagnosis not present

## 2018-02-07 DIAGNOSIS — R351 Nocturia: Secondary | ICD-10-CM | POA: Diagnosis not present

## 2018-02-07 DIAGNOSIS — R350 Frequency of micturition: Secondary | ICD-10-CM | POA: Diagnosis not present

## 2018-02-13 ENCOUNTER — Other Ambulatory Visit: Payer: Self-pay | Admitting: *Deleted

## 2018-02-13 MED ORDER — NITROGLYCERIN 0.4 MG SL SUBL: 0.4000 mg | SUBLINGUAL_TABLET | SUBLINGUAL | 1 refills | Status: AC | PRN

## 2018-02-25 DIAGNOSIS — L821 Other seborrheic keratosis: Secondary | ICD-10-CM | POA: Diagnosis not present

## 2018-02-25 DIAGNOSIS — Z85820 Personal history of malignant melanoma of skin: Secondary | ICD-10-CM | POA: Diagnosis not present

## 2018-02-25 DIAGNOSIS — Z85828 Personal history of other malignant neoplasm of skin: Secondary | ICD-10-CM | POA: Diagnosis not present

## 2018-02-25 DIAGNOSIS — D225 Melanocytic nevi of trunk: Secondary | ICD-10-CM | POA: Diagnosis not present

## 2018-03-19 DIAGNOSIS — M4690 Unspecified inflammatory spondylopathy, site unspecified: Secondary | ICD-10-CM | POA: Diagnosis not present

## 2018-03-19 DIAGNOSIS — Z1589 Genetic susceptibility to other disease: Secondary | ICD-10-CM | POA: Diagnosis not present

## 2018-03-19 DIAGNOSIS — M79671 Pain in right foot: Secondary | ICD-10-CM | POA: Diagnosis not present

## 2018-03-19 DIAGNOSIS — M1120 Other chondrocalcinosis, unspecified site: Secondary | ICD-10-CM | POA: Diagnosis not present

## 2018-03-26 ENCOUNTER — Other Ambulatory Visit: Payer: Self-pay | Admitting: Nurse Practitioner

## 2018-04-02 ENCOUNTER — Other Ambulatory Visit: Payer: Self-pay | Admitting: Nurse Practitioner

## 2018-04-08 ENCOUNTER — Ambulatory Visit: Payer: BLUE CROSS/BLUE SHIELD | Admitting: Nurse Practitioner

## 2018-04-08 ENCOUNTER — Encounter: Payer: Self-pay | Admitting: Nurse Practitioner

## 2018-04-08 ENCOUNTER — Encounter (INDEPENDENT_AMBULATORY_CARE_PROVIDER_SITE_OTHER): Payer: Self-pay

## 2018-04-08 VITALS — BP 118/60 | HR 53 | Ht 68.0 in | Wt 175.0 lb

## 2018-04-08 DIAGNOSIS — E7800 Pure hypercholesterolemia, unspecified: Secondary | ICD-10-CM | POA: Diagnosis not present

## 2018-04-08 DIAGNOSIS — I2510 Atherosclerotic heart disease of native coronary artery without angina pectoris: Secondary | ICD-10-CM | POA: Diagnosis not present

## 2018-04-08 DIAGNOSIS — I10 Essential (primary) hypertension: Secondary | ICD-10-CM | POA: Diagnosis not present

## 2018-04-08 LAB — BASIC METABOLIC PANEL
BUN/Creatinine Ratio: 22 (ref 10–24)
BUN: 22 mg/dL (ref 8–27)
CO2: 25 mmol/L (ref 20–29)
Calcium: 8.8 mg/dL (ref 8.6–10.2)
Chloride: 106 mmol/L (ref 96–106)
Creatinine, Ser: 0.98 mg/dL (ref 0.76–1.27)
GFR calc Af Amer: 87 mL/min/{1.73_m2} (ref >59)
GFR calc non Af Amer: 75 mL/min/{1.73_m2} (ref >59)
Glucose: 94 mg/dL (ref 65–99)
Potassium: 4.1 mmol/L (ref 3.5–5.2)
Sodium: 143 mmol/L (ref 134–144)

## 2018-04-08 LAB — LIPID PANEL
Chol/HDL Ratio: 2.8 ratio (ref 0.0–5.0)
Cholesterol, Total: 116 mg/dL (ref 100–199)
HDL: 42 mg/dL (ref >39)
LDL Calculated: 58 mg/dL (ref 0–99)
Triglycerides: 82 mg/dL (ref 0–149)
VLDL Cholesterol Cal: 16 mg/dL (ref 5–40)

## 2018-04-08 LAB — HEPATIC FUNCTION PANEL
ALT: 24 IU/L (ref 0–44)
AST: 20 IU/L (ref 0–40)
Albumin: 4 g/dL (ref 3.5–4.8)
Alkaline Phosphatase: 50 IU/L (ref 39–117)
Bilirubin Total: 0.3 mg/dL (ref 0.0–1.2)
Bilirubin, Direct: 0.11 mg/dL (ref 0.00–0.40)
Total Protein: 6.6 g/dL (ref 6.0–8.5)

## 2018-04-08 NOTE — Patient Instructions (Addendum)
We will be checking the following labs today - BMET, lipids and HPF   Medication Instructions:    Continue with your current medicines.     Testing/Procedures To Be Arranged:  N/A  Follow-Up:   See me in 6 months with fasting labs    Other Special Instructions:   N/A    If you need a refill on your cardiac medications before your next appointment, please call your pharmacy.   Call the [place] office at [phone] if you have any questions, problems or concerns.

## 2018-04-08 NOTE — Progress Notes (Signed)
CARDIOLOGY OFFICE NOTE  Date:  04/08/2018    Robert Reilly Date of Birth: 1941/07/16 Medical Record #[id number]  PCP:  Alroy Dust, L.Marlou Sa, MD  Cardiologist:  Servando Snare [facility]    Chief Complaint  Patient presents with  . Coronary Artery Disease  . Hypertension  . Hyperlipidemia    6 month check - seen for Dr. Marlou Porch    History of Present Illness: Robert Reilly is a 77 y.o. male who presents today for a 6 month check. Seen for Dr. Marlou Porch.   He has a history of coronary artery disease post myocardial infarction in 2004 with stent to RCA. Other issues include HTN, HLD and RA.He has had some situational stress with being in the caregiver role for his wife.   Seen back in Mayof 2017 by Dr. Mila Merry noted that after after weight loss, his blood pressure decreased significantly and was able to be offof amlodipine and HCTZ and a lower dose of beta blocker.   Ithensaw him in [place] 2017 - using more salt - went for a pre op visit for hernia surgery and BP sky high - Toprol increased back up - I added back HCTZand subsequently Norvase - he eats out most meals due to his wife's situation. Last seen back in December of 2018 by me and he was doing ok.Had had surgery on his face for a melanoma. Still with lots of salt and probably too much alcohol. 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  Typically has better outpatient control of his BP.   Comes in today. Here alone. Says he is feeling good. His GU drugs are being changed around. He has had a recent URI - just getting over. Otherwise, he is doing well. No chest pain. BP is good - he has stopped checking at home. Not short of breath. Not dizzy or lightheaded. He is planning a trip to [place].   Past Medical History:  Diagnosis Date  . Acute myocardial infarction of other inferior wall, subsequent episode of care   . Cancer ([place])    melenoma  back , left arm   4-5 yrs ago  .  Coronary atherosclerosis of native coronary artery   . GERD (gastroesophageal reflux disease)   . Gout   . History of hiatal hernia   . Hypercholesteremia   . Hypertension   . Old MI (myocardial infarction) 09/10/2013   2004, stent to RCA  . Osteoarthritis   . Other disorder of calcium metabolism   . Tick bite 05/25/2016   states has had 5 in past month    Past Surgical History:  Procedure Laterality Date  . CARDIAC CATHETERIZATION    . CORONARY ANGIOPLASTY     stent x 1    12-15 yrs ago  . EYE SURGERY Bilateral    cataracts  . INSERTION OF MESH N/A 05/30/2016   Procedure: INSERTION OF MESH;  Surgeon: Jackolyn Confer, MD;  [facility]: [facility];  Service: General;  Laterality: N/A;  . MELANOMA EXCISION     back, left arm  . SHOULDER ARTHROSCOPY WITH ROTATOR CUFF REPAIR Left   . UMBILICAL HERNIA REPAIR N/A 05/30/2016   Procedure: UMBILICAL HERNIA REPAIR;  Surgeon: Jackolyn Confer, MD;  [facility];  Service: General;  Laterality: N/A;     Medications: Current Meds  Medication Sig  . allopurinol (ZYLOPRIM) 100 MG tablet Take 100 mg by mouth 2 (two) times daily.   Marland Kitchen  amLODipine (NORVASC) 5 MG tablet TAKE 1 TABLET BY MOUTH DAILY.  Marland Kitchen colchicine 0.6 MG tablet Take 0.6 mg by mouth daily.   . Flaxseed, Linseed, (FLAX SEED OIL) 1000 MG CAPS Take 1,000 mg by mouth 2 (two) times daily.   . hydrochlorothiazide (HYDRODIURIL) 25 MG tablet TAKE 1 TABLET BY MOUTH DAILY.  . meloxicam (MOBIC) 15 MG tablet Take 15 mg by mouth daily.   . metoprolol succinate (TOPROL-XL) 50 MG 24 hr tablet TAKE 1 TABLET (50 MG TOTAL) BY MOUTH DAILY.  . nitroGLYCERIN (NITROSTAT) 0.4 MG SL tablet Place 1 tablet (0.4 mg total) under the tongue every 5 (five) minutes as needed for chest pain.  Marland Kitchen omeprazole (PRILOSEC) 20 MG capsule TAKE 1 CAPSULE BY MOUTH DAILY  . oxybutynin (DITROPAN-XL) 10 MG 24 hr tablet Take 1 tablet by mouth daily.  . ramipril (ALTACE) 10 MG capsule TAKE 1 CAPSULE BY MOUTH 2 TIMES DAILY.  .  rosuvastatin (CRESTOR) 20 MG tablet TAKE 1 TABLET BY MOUTH AT BEDTIME.     Allergies: Allergies  Allergen Reactions  . Tetracyclines & Related Rash  . Other Other (See Comments)    No meat: Came from a tick bite     Social History: The patient  reports that he quit smoking about 16 years ago. His smoking use included cigarettes. He has quit using smokeless tobacco. He reports that he drinks alcohol. He reports that he does not use drugs.   Family History: The patient's family history includes Cancer in his father; Stroke in his mother.   Review of Systems: Please see the history of present illness.   Otherwise, the review of systems is positive for none.   All other systems are reviewed and negative.   Physical Exam: VS:  BP 118/60   Pulse (!) 53   Ht 5\' 8"  (1.727 m)   Wt 175 lb (79.4 kg)   SpO2 98%   BMI 26.61 kg/m  .  BMI Body mass index is 26.61 kg/m.  Wt Readings from Last 3 Encounters:  04/08/18 175 lb (79.4 kg)  10/02/17 175 lb (79.4 kg)  03/19/17 171 lb 1.9 oz (77.6 kg)    General: Pleasant. Well developed, well nourished and in no acute distress.   HEENT: Normal.  Neck: Supple, no JVD, carotid bruits, or masses noted.  Cardiac: Regular rate and rhythm. No murmurs, rubs, or gallops. No edema.  Respiratory:  Lungs are clear to auscultation bilaterally with normal work of breathing.  GI: Soft and nontender.  MS: No deformity or atrophy. Gait and ROM intact.  Skin: Warm and dry. Color is normal.  Neuro:  Strength and sensation are intact and no gross focal deficits noted.  Psych: Alert, appropriate and with normal affect.   LABORATORY DATA:  EKG:  EKG is not ordered today.  Lab Results  Component Value Date   WBC 8.8 10/10/2017   HGB 14.9 10/10/2017   HCT 44.8 10/10/2017   PLT 229 10/10/2017   GLUCOSE 62 (L) 10/10/2017   CHOL 132 10/02/2017   TRIG 106 10/02/2017   HDL 46 10/02/2017   LDLDIRECT 76.0 03/15/2015   LDLCALC 65 10/02/2017   ALT 21  10/02/2017   AST 23 10/02/2017   NA 142 10/10/2017   K 4.0 10/10/2017   CL 105 10/10/2017   CREATININE 1.08 10/10/2017   BUN 17 10/10/2017   CO2 28 10/10/2017       BNP (last 3 results) No results for input(s): BNP in the last 8760  hours.  ProBNP (last 3 results) No results for input(s): PROBNP in the last 8760 hours.   Other Studies Reviewed Today:   Assessment/Plan:  1. HTN - BP is great on current therapy - no changes made today.    2. CAD - no active symptoms - lab today - continue with CV risk factor modification.   3. HLD - on statin - getting lab today.   4. Situational stress - he is the primary caregiver for his wife. Does not seem to be too much an issue today.   5. RA - followed by Dr. Milly Jakob - not discussed   Current medicines are reviewed with the patient today.  The patient does not have concerns regarding medicines other than what has been noted above.  The following changes have been made:  See above.  Labs/ tests ordered today include:    Orders Placed This Encounter  Procedures  . Basic metabolic panel  . Hepatic function panel  . Lipid panel     Disposition:   FU with me in 6 months with fasting labs.    Patient is agreeable to this plan and will call if any problems develop in the interim.   SignedTruitt Merle, NP  04/08/2018 11:03 AM  [facility] [address] [place], [place]  [address] Phone: [phone] Fax: [phone]

## 2018-06-18 ENCOUNTER — Other Ambulatory Visit: Payer: Self-pay | Admitting: Nurse Practitioner

## 2018-06-21 DIAGNOSIS — M545 Low back pain: Secondary | ICD-10-CM | POA: Diagnosis not present

## 2018-06-24 DIAGNOSIS — R768 Other specified abnormal immunological findings in serum: Secondary | ICD-10-CM | POA: Diagnosis not present

## 2018-06-24 DIAGNOSIS — Z1589 Genetic susceptibility to other disease: Secondary | ICD-10-CM | POA: Diagnosis not present

## 2018-06-24 DIAGNOSIS — M1120 Other chondrocalcinosis, unspecified site: Secondary | ICD-10-CM | POA: Diagnosis not present

## 2018-06-24 DIAGNOSIS — M1A09X0 Idiopathic chronic gout, multiple sites, without tophus (tophi): Secondary | ICD-10-CM | POA: Diagnosis not present

## 2018-06-24 DIAGNOSIS — M4690 Unspecified inflammatory spondylopathy, site unspecified: Secondary | ICD-10-CM | POA: Diagnosis not present

## 2018-08-20 DIAGNOSIS — L812 Freckles: Secondary | ICD-10-CM | POA: Diagnosis not present

## 2018-08-20 DIAGNOSIS — L570 Actinic keratosis: Secondary | ICD-10-CM | POA: Diagnosis not present

## 2018-08-20 DIAGNOSIS — L821 Other seborrheic keratosis: Secondary | ICD-10-CM | POA: Diagnosis not present

## 2018-08-20 DIAGNOSIS — C4339 Malignant melanoma of other parts of face: Secondary | ICD-10-CM | POA: Diagnosis not present

## 2018-08-20 DIAGNOSIS — L720 Epidermal cyst: Secondary | ICD-10-CM | POA: Diagnosis not present

## 2018-08-20 DIAGNOSIS — Z85828 Personal history of other malignant neoplasm of skin: Secondary | ICD-10-CM | POA: Diagnosis not present

## 2018-09-12 ENCOUNTER — Other Ambulatory Visit: Payer: Self-pay | Admitting: Cardiology

## 2018-09-16 DIAGNOSIS — D0339 Melanoma in situ of other parts of face: Secondary | ICD-10-CM | POA: Diagnosis not present

## 2018-09-16 DIAGNOSIS — C4339 Malignant melanoma of other parts of face: Secondary | ICD-10-CM | POA: Diagnosis not present

## 2018-09-17 DIAGNOSIS — L7682 Other postprocedural complications of skin and subcutaneous tissue: Secondary | ICD-10-CM | POA: Diagnosis not present

## 2018-09-17 DIAGNOSIS — D0339 Melanoma in situ of other parts of face: Secondary | ICD-10-CM | POA: Diagnosis not present

## 2018-09-18 DIAGNOSIS — D0339 Melanoma in situ of other parts of face: Secondary | ICD-10-CM | POA: Diagnosis not present

## 2018-09-18 DIAGNOSIS — L7682 Other postprocedural complications of skin and subcutaneous tissue: Secondary | ICD-10-CM | POA: Diagnosis not present

## 2018-09-18 DIAGNOSIS — L988 Other specified disorders of the skin and subcutaneous tissue: Secondary | ICD-10-CM | POA: Diagnosis not present

## 2018-09-28 ENCOUNTER — Other Ambulatory Visit: Payer: Self-pay | Admitting: Nurse Practitioner

## 2018-10-08 NOTE — Progress Notes (Signed)
CARDIOLOGY OFFICE NOTE  Date:  10/09/2018    Robert Reilly Date of Birth: 1941/10/12 Medical Record #[id number]  PCP:  Alroy Dust, L.Marlou Sa, MD  Cardiologist:  Servando Snare [facility]    Chief Complaint  Patient presents with  . Coronary Artery Disease    6 month check.     History of Present Illness: Robert Reilly is a 76 y.o. male who presents today for a 6 month check. Seen for Dr. Marlou Porch. Primarily follows with me.   He has a history of coronary artery disease post myocardial infarction in 2004 with stent to RCA. Other issues include HTN, HLD and RA.He has had some situational stress with being in the caregiver role for his wife.   Seen back in Mayof 2017 by Dr. Mila Merry noted that after after weight loss, his blood pressure decreased significantly and was able to be offof amlodipine and HCTZ and a lower dose of beta blocker.   Ithensaw him in [place] 2017- using more salt - went for a pre op visit for hernia surgery and BP sky high - Toprol increased back up - I added back HCTZand subsequently Norvase - he eats out most meals due to his wife's situation. Had had surgery on his face for a melanoma. Still with lots of salt and probably too much alcohol. 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  Typically has better outpatient control of his BP. Last visit with me was from June and he was stable.   Comes in today. Here alone.He feels pretty good. He ate too much over the holidays - he has been less active. He has had to have surgery on his face for melanoma. He has a bad ankle. Trying to use less salt but probably still drinking a fair amount. He has gained 11 pounds - he is aware. No chest pain. BP around 130 to 135 at home. He feels good on his medicines. No real concerns. He has been watching the impeachment hearings.   Past Medical History:  Diagnosis Date  . Acute myocardial infarction of other inferior wall,  subsequent episode of care   . Cancer ([place])    melenoma  back , left arm   4-5 yrs ago  . Coronary atherosclerosis of native coronary artery   . GERD (gastroesophageal reflux disease)   . Gout   . History of hiatal hernia   . Hypercholesteremia   . Hypertension   . Old MI (myocardial infarction) 09/10/2013   2004, stent to RCA  . Osteoarthritis   . Other disorder of calcium metabolism   . Tick bite 05/25/2016   states has had 5 in past month    Past Surgical History:  Procedure Laterality Date  . CARDIAC CATHETERIZATION    . CORONARY ANGIOPLASTY     stent x 1    12-15 yrs ago  . EYE SURGERY Bilateral    cataracts  . INSERTION OF MESH N/A 05/30/2016   Procedure: INSERTION OF MESH;  Surgeon: Jackolyn Confer, MD;  [facility];  Service: General;  Laterality: N/A;  . MELANOMA EXCISION     back, left arm  . SHOULDER ARTHROSCOPY WITH ROTATOR CUFF REPAIR Left   . UMBILICAL HERNIA REPAIR N/A 05/30/2016   Procedure: UMBILICAL HERNIA REPAIR;  Surgeon: Jackolyn Confer, MD;  [facility];  Service: General;  Laterality: N/A;     Medications: Current Meds  Medication Sig  .  allopurinol (ZYLOPRIM) 100 MG tablet Take 100 mg by mouth 2 (two) times daily.   Marland Kitchen amLODipine (NORVASC) 5 MG tablet TAKE 1 TABLET BY MOUTH DAILY.  Marland Kitchen colchicine 0.6 MG tablet Take 0.6 mg by mouth daily.   . Flaxseed, Linseed, (FLAX SEED OIL) 1000 MG CAPS Take 1,000 mg by mouth 2 (two) times daily.   . hydrochlorothiazide (HYDRODIURIL) 25 MG tablet TAKE 1 TABLET BY MOUTH DAILY.  . meloxicam (MOBIC) 15 MG tablet Take 15 mg by mouth daily.   . metoprolol succinate (TOPROL-XL) 50 MG 24 hr tablet TAKE 1 TABLET BY MOUTH DAILY.  . nitroGLYCERIN (NITROSTAT) 0.4 MG SL tablet Place 1 tablet (0.4 mg total) under the tongue every 5 (five) minutes as needed for chest pain.  Marland Kitchen omeprazole (PRILOSEC) 20 MG capsule TAKE 1 CAPSULE BY MOUTH DAILY  . oxybutynin (DITROPAN-XL) 10 MG 24 hr tablet Take 1 tablet by mouth daily.  .  ramipril (ALTACE) 10 MG capsule TAKE 1 CAPSULE BY MOUTH 2 TIMES DAILY.  . rosuvastatin (CRESTOR) 20 MG tablet TAKE 1 TABLET BY MOUTH AT BEDTIME.     Allergies: Allergies  Allergen Reactions  . Tetracyclines & Related Rash  . Other Other (See Comments)    No meat: Came from a tick bite     Social History: The patient  reports that he quit smoking about 17 years ago. His smoking use included cigarettes. He has quit using smokeless tobacco. He reports that he drinks alcohol. He reports that he does not use drugs.   Family History: The patient's family history includes Cancer in his father; Stroke in his mother.   Review of Systems: Please see the history of present illness.   Otherwise, the review of systems is positive for none.   All other systems are reviewed and negative.   Physical Exam: VS:  BP 140/86   Pulse (!) 54   Ht 5\' 8"  (1.727 m)   Wt 186 lb (84.4 kg)   SpO2 96%   BMI 28.28 kg/m  .  BMI Body mass index is 28.28 kg/m.  Wt Readings from Last 3 Encounters:  10/09/18 186 lb (84.4 kg)  04/08/18 175 lb (79.4 kg)  10/02/17 175 lb (79.4 kg)   BP recheck by me is 140/80  General: Pleasant. Well developed, well nourished and in no acute distress.   HEENT: Normal. Scarring over the right face - looks really good. Face is ruddy. Neck: Supple, no JVD, carotid bruits, or masses noted.  Cardiac: Regular rate and rhythm. No murmurs, rubs, or gallops. No edema.  Respiratory:  Lungs are clear to auscultation bilaterally with normal work of breathing.  GI: Soft and nontender.  MS: No deformity or atrophy. Gait and ROM intact.  Skin: Warm and dry. Color is normal.  Neuro:  Strength and sensation are intact and no gross focal deficits noted.  Psych: Alert, appropriate and with normal affect.   LABORATORY DATA:  EKG:  EKG is ordered today. This demonstrates sinus bradycardia - HR is 54.  Lab Results  Component Value Date   WBC 8.8 10/10/2017   HGB 14.9 10/10/2017   HCT  44.8 10/10/2017   PLT 229 10/10/2017   GLUCOSE 94 04/08/2018   CHOL 116 04/08/2018   TRIG 82 04/08/2018   HDL 42 04/08/2018   LDLDIRECT 76.0 03/15/2015   LDLCALC 58 04/08/2018   ALT 24 04/08/2018   AST 20 04/08/2018   NA 143 04/08/2018   K 4.1 04/08/2018   CL 106  04/08/2018   CREATININE 0.98 04/08/2018   BUN 22 04/08/2018   CO2 25 04/08/2018       BNP (last 3 results) No results for input(s): BNP in the last 8760 hours.  ProBNP (last 3 results) No results for input(s): PROBNP in the last 8760 hours.   Other Studies Reviewed Today:   Assessment/Plan:  1. HTN - BP is fair - he has gained weight - he is cautioned about this. No changes made today.     2. CAD - he has no active symptoms - needs to get back on track with CV risk factor modification. He knows what he needs to be doing.    3. HLD - remains on statin - lab today. on statin - getting lab today.   4. Weight gain - he says he is going to "do better". Advised to cut back his alcohol consumption too.   Current medicines are reviewed with the patient today.  The patient does not have concerns regarding medicines other than what has been noted above.  The following changes have been made:  See above.  Labs/ tests ordered today include:    Orders Placed This Encounter  Procedures  . Basic metabolic panel  . Hepatic function panel  . Lipid panel  . EKG 12-Lead     Disposition:   FU with me in 6 months.   Patient is agreeable to this plan and will call if any problems develop in the interim.   SignedTruitt Merle, NP  10/09/2018 9:18 AM  [facility] [address] [place], [place]  [address] Phone: [phone] Fax: [phone]

## 2018-10-09 ENCOUNTER — Ambulatory Visit: Payer: BLUE CROSS/BLUE SHIELD | Admitting: Nurse Practitioner

## 2018-10-09 ENCOUNTER — Encounter: Payer: Self-pay | Admitting: Nurse Practitioner

## 2018-10-09 VITALS — BP 140/86 | HR 54 | Ht 68.0 in | Wt 186.0 lb

## 2018-10-09 DIAGNOSIS — I2510 Atherosclerotic heart disease of native coronary artery without angina pectoris: Secondary | ICD-10-CM | POA: Diagnosis not present

## 2018-10-09 DIAGNOSIS — I10 Essential (primary) hypertension: Secondary | ICD-10-CM | POA: Diagnosis not present

## 2018-10-09 DIAGNOSIS — E7800 Pure hypercholesterolemia, unspecified: Secondary | ICD-10-CM

## 2018-10-09 LAB — HEPATIC FUNCTION PANEL
ALT: 30 IU/L (ref 0–44)
AST: 33 IU/L (ref 0–40)
Albumin: 4.1 g/dL (ref 3.5–4.8)
Alkaline Phosphatase: 47 IU/L (ref 39–117)
Bilirubin Total: 0.6 mg/dL (ref 0.0–1.2)
Bilirubin, Direct: 0.15 mg/dL (ref 0.00–0.40)
Total Protein: 7 g/dL (ref 6.0–8.5)

## 2018-10-09 LAB — BASIC METABOLIC PANEL
BUN/Creatinine Ratio: 22 (ref 10–24)
BUN: 23 mg/dL (ref 8–27)
CO2: 23 mmol/L (ref 20–29)
Calcium: 9.3 mg/dL (ref 8.6–10.2)
Chloride: 103 mmol/L (ref 96–106)
Creatinine, Ser: 1.06 mg/dL (ref 0.76–1.27)
GFR calc Af Amer: 78 mL/min/{1.73_m2} (ref >59)
GFR calc non Af Amer: 68 mL/min/{1.73_m2} (ref >59)
Glucose: 98 mg/dL (ref 65–99)
Potassium: 3.8 mmol/L (ref 3.5–5.2)
Sodium: 140 mmol/L (ref 134–144)

## 2018-10-09 LAB — LIPID PANEL
Chol/HDL Ratio: 3.8 ratio (ref 0.0–5.0)
Cholesterol, Total: 121 mg/dL (ref 100–199)
HDL: 32 mg/dL — ABNORMAL LOW (ref >39)
LDL Calculated: 58 mg/dL (ref 0–99)
Triglycerides: 156 mg/dL — ABNORMAL HIGH (ref 0–149)
VLDL Cholesterol Cal: 31 mg/dL (ref 5–40)

## 2018-10-09 NOTE — Patient Instructions (Addendum)
We will be checking the following labs today - BMET, Lipids and HPF  If you have labs (blood work) drawn today and your tests are completely normal, you will receive your results only by: Marland Kitchen MyChart Message (if you have MyChart) OR . A paper copy in the mail If you have any lab test that is abnormal or we need to change your treatment, we will call you to review the results.   Medication Instructions:    Continue with your current medicines.    If you need a refill on your cardiac medications before your next appointment, please call your pharmacy.     Testing/Procedures To Be Arranged:  N/A  Follow-Up:   See me in 6 months    At [facility], you and your health needs are our priority.  As part of our continuing mission to provide you with exceptional heart care, we have created designated Provider Care Teams.  These Care Teams include your primary Cardiologist (physician) and Advanced Practice Providers (APPs -  Physician Assistants and Nurse Practitioners) who all work together to provide you with the care you need, when you need it.  Special Instructions:  . Try to get back on track.   Call the [place] office at [phone] if you have any questions, problems or concerns.

## 2018-10-17 DIAGNOSIS — D0339 Melanoma in situ of other parts of face: Secondary | ICD-10-CM | POA: Diagnosis not present

## 2018-10-22 ENCOUNTER — Other Ambulatory Visit: Payer: Self-pay | Admitting: Cardiology

## 2018-12-18 ENCOUNTER — Ambulatory Visit (HOSPITAL_COMMUNITY)
Admission: EM | Admit: 2018-12-18 | Discharge: 2018-12-18 | Disposition: A | Discharge status: Home / Self Care | Payer: Managed Care, Other (non HMO) | Attending: Family Medicine | Admitting: Family Medicine

## 2018-12-18 ENCOUNTER — Encounter (HOSPITAL_COMMUNITY): Payer: Self-pay | Admitting: Emergency Medicine

## 2018-12-18 DIAGNOSIS — W57XXXA Bitten or stung by nonvenomous insect and other nonvenomous arthropods, initial encounter: Secondary | ICD-10-CM

## 2018-12-18 DIAGNOSIS — T148XXA Other injury of unspecified body region, initial encounter: Secondary | ICD-10-CM

## 2018-12-18 DIAGNOSIS — S30861A Insect bite (nonvenomous) of abdominal wall, initial encounter: Secondary | ICD-10-CM | POA: Diagnosis not present

## 2018-12-18 MED ORDER — MUPIROCIN 2 % EX OINT: 1.0000 "application " | TOPICAL_OINTMENT | Freq: Two times a day (BID) | CUTANEOUS | 0 refills | Status: DC

## 2018-12-18 NOTE — Discharge Instructions (Addendum)
Unable to remove the foreign body. As discussed, your body will push out the rest of the tick. Bactroban ointment as directed. Warm compress. Monitor for spreading redness, warmth, fever, rashes, follow up for reevaluation needed.

## 2018-12-18 NOTE — ED Provider Notes (Signed)
[facility]    CSN: [id number] Arrival date & time: 12/18/18  1315     History   Chief Complaint No chief complaint on file.   HPI Robert Reilly is a 78 y.o. male.   78 year old male noticed a tick to his abdomen after hunting this morning. States removed the tick, but part of it is still stuck to the skin. He and his wife has tried removing without success. Denies pain. Denies swelling, erythema, warmth. Has not taken anything for it.      Past Medical History:  Diagnosis Date  . Acute myocardial infarction of other inferior wall, subsequent episode of care   . Cancer ([place])    melenoma  back , left arm   4-5 yrs ago  . Coronary atherosclerosis of native coronary artery   . GERD (gastroesophageal reflux disease)   . Gout   . History of hiatal hernia   . Hypercholesteremia   . Hypertension   . Old MI (myocardial infarction) 09/10/2013   2004, stent to RCA  . Osteoarthritis   . Other disorder of calcium metabolism   . Tick bite 05/25/2016   states has had 5 in past month    Patient Active Problem List   Diagnosis Date Noted  . Old MI (myocardial infarction) 09/10/2013  . Coronary atherosclerosis of native coronary artery 09/10/2013  . RA (rheumatoid arthritis) ([place]) 09/10/2013  . HTN (hypertension) 09/10/2013    Past Surgical History:  Procedure Laterality Date  . CARDIAC CATHETERIZATION    . CORONARY ANGIOPLASTY     stent x 1    12-15 yrs ago  . EYE SURGERY Bilateral    cataracts  . INSERTION OF MESH N/A 05/30/2016   Procedure: INSERTION OF MESH;  Surgeon: Jackolyn Confer, MD;  [facility];  Service: General;  Laterality: N/A;  . MELANOMA EXCISION     back, left arm  . SHOULDER ARTHROSCOPY WITH ROTATOR CUFF REPAIR Left   . UMBILICAL HERNIA REPAIR N/A 05/30/2016   Procedure: UMBILICAL HERNIA REPAIR;  Surgeon: Jackolyn Confer, MD;  [facility];  Service: General;  Laterality: N/A;       Home Medications    Prior to Admission  medications   Medication Sig Start Date End Date Taking? Authorizing Provider  allopurinol (ZYLOPRIM) 100 MG tablet Take 100 mg by mouth 2 (two) times daily.  02/13/15   [provider]  amLODipine (NORVASC) 5 MG tablet TAKE 1 TABLET BY MOUTH DAILY. 09/30/18   Burtis Junes, NP  colchicine 0.6 MG tablet Take 0.6 mg by mouth daily.     [provider]  Flaxseed, Linseed, (FLAX SEED OIL) 1000 MG CAPS Take 1,000 mg by mouth 2 (two) times daily.     [provider]  hydrochlorothiazide (HYDRODIURIL) 25 MG tablet TAKE 1 TABLET BY MOUTH DAILY. 04/02/18   Burtis Junes, NP  meloxicam (MOBIC) 15 MG tablet Take 15 mg by mouth daily.  09/06/13   [provider]  metoprolol succinate (TOPROL-XL) 50 MG 24 hr tablet TAKE 1 TABLET BY MOUTH DAILY. 06/18/18   Burtis Junes, NP  mupirocin ointment (BACTROBAN) 2 % Apply 1 application topically 2 (two) times daily. 12/18/18   Tasia Catchings, Amy V, PA-C  nitroGLYCERIN (NITROSTAT) 0.4 MG SL tablet Place 1 tablet (0.4 mg total) under the tongue every 5 (five) minutes as needed for chest pain. 02/13/18   Burtis Junes, NP  omeprazole (PRILOSEC) 20 MG capsule TAKE 1 CAPSULE  BY MOUTH DAILY 10/22/18   Jerline Pain, MD  oxybutynin (DITROPAN-XL) 10 MG 24 hr tablet Take 1 tablet by mouth daily. 04/02/18   [provider]  ramipril (ALTACE) 10 MG capsule TAKE 1 CAPSULE BY MOUTH 2 TIMES DAILY. 10/02/17   Burtis Junes, NP  rosuvastatin (CRESTOR) 20 MG tablet TAKE 1 TABLET BY MOUTH AT BEDTIME. 09/12/18   Burtis Junes, NP    Family History Family History  Problem Relation Age of Onset  . Stroke Mother   . Cancer Father     Social History Social History   Tobacco Use  . Smoking status: Former Smoker    Types: Cigarettes    Last attempt to quit: 05/25/2001    Years since quitting: 17.5  . Smokeless tobacco: Former Network engineer Use Topics  . Alcohol use: Yes    Comment: 2-3 drinks week- gin  . Drug use: No      Allergies   Tetracyclines & related and Other   Review of Systems Review of Systems  Reason unable to perform ROS: See HPI as above.     Physical Exam Triage Vital Signs ED Triage Vitals [12/18/18 1415]  Enc Vitals Group     BP 136/86     Pulse Rate 70     Resp 18     Temp 97.9 F (36.6 C)     Temp src      SpO2 94 %     Weight      Height      Head Circumference      Peak Flow      Pain Score 0     Pain Loc      Pain Edu?      Excl. in [place]?    No data found.  Updated Vital Signs BP 136/86   Pulse 70   Temp 97.9 F (36.6 C)   Resp 18   SpO2 94%   Visual Acuity Right Eye Distance:   Left Eye Distance:   Bilateral Distance:    Right Eye Near:   Left Eye Near:    Bilateral Near:     Physical Exam Constitutional:      General: He is not in acute distress.    Appearance: He is well-developed. He is not diaphoretic.  HENT:     Head: Normocephalic and atraumatic.  Eyes:     Conjunctiva/sclera: Conjunctivae normal.     Pupils: Pupils are equal, round, and reactive to light.  Abdominal:     General: Bowel sounds are normal.     Palpations: Abdomen is soft.     Tenderness: There is no abdominal tenderness. There is no guarding or rebound.     Comments: 0.3cm round erythema to the right periumbilical region. Small black line noted for possible foreign body. No tenderness to palpation. No swelling.  Neurological:     Mental Status: He is alert and oriented to person, place, and time.      UC Treatments / Results  Labs (all labs ordered are listed, but only abnormal results are displayed) Labs Reviewed - No data to display  EKG None  Radiology No results found.  Procedures Procedures (including critical care time)  Medications Ordered in UC Medications - No data to display  Initial Impression / Assessment and Plan / UC Course  I have reviewed the triage vital signs and the nursing notes.  Pertinent labs & imaging results that were  available during my care of  the patient were reviewed by me and considered in my medical decision making (see chart for details).    Discussed with patient, hard to remove small foreign body from tick. Patient would still like attempt for removal. Cleaned area with chloraprep. Gently scraped with tip of scissors to reveal small hairlike foreign body. Unable to grab with forceps. Discussed with patient will apply bactroban for the next few days due to continued attempts to remove hairlike foreign body. Return precautions given. Patient expresses understanding and agrees to plan.  Final Clinical Impressions(s) / UC Diagnoses   Final diagnoses:  Tick bite of abdomen, initial encounter  Foreign body in skin    ED Prescriptions    Medication Sig Dispense Auth. Provider   mupirocin ointment (BACTROBAN) 2 % Apply 1 application topically 2 (two) times daily. 22 g Tobin Chad, [place] 12/18/18 1507

## 2018-12-18 NOTE — ED Triage Notes (Signed)
Pt states he pulled a tick of the front of his stomach and he thinks a part of it is stuck in his skin. No pain.

## 2018-12-26 ENCOUNTER — Other Ambulatory Visit: Payer: Self-pay | Admitting: Nurse Practitioner

## 2018-12-26 DIAGNOSIS — I10 Essential (primary) hypertension: Secondary | ICD-10-CM

## 2018-12-26 DIAGNOSIS — E7800 Pure hypercholesterolemia, unspecified: Secondary | ICD-10-CM

## 2018-12-26 DIAGNOSIS — I2510 Atherosclerotic heart disease of native coronary artery without angina pectoris: Secondary | ICD-10-CM

## 2019-01-06 ENCOUNTER — Other Ambulatory Visit: Payer: Self-pay | Admitting: Nurse Practitioner

## 2019-03-15 ENCOUNTER — Other Ambulatory Visit: Payer: Self-pay | Admitting: Nurse Practitioner

## 2019-03-15 DIAGNOSIS — E7800 Pure hypercholesterolemia, unspecified: Secondary | ICD-10-CM

## 2019-03-15 DIAGNOSIS — I10 Essential (primary) hypertension: Secondary | ICD-10-CM

## 2019-03-15 DIAGNOSIS — I2510 Atherosclerotic heart disease of native coronary artery without angina pectoris: Secondary | ICD-10-CM

## 2019-04-07 ENCOUNTER — Telehealth: Payer: Self-pay | Admitting: *Deleted

## 2019-04-07 NOTE — Telephone Encounter (Signed)
lvm instructions for upcoming telehealth visit on 6/15 @ 8:00.  Pt is instructed if cannot keep appt to call office to R/S.  Pt will need consent in chart day of visit.

## 2019-04-13 NOTE — Progress Notes (Signed)
Telehealth Visit     Virtual Visit via Video Note   This visit type was conducted due to national recommendations for restrictions regarding the COVID-19 Pandemic (e.g. social distancing) in an effort to limit this patient's exposure and mitigate transmission in our community.  Due to his co-morbid illnesses, this patient is at least at moderate risk for complications without adequate follow up.  This format is felt to be most appropriate for this patient at this time.  All issues noted in this document were discussed and addressed.  A limited physical exam was performed with this format.  Please refer to the patient's chart for his consent to telehealth for [facility].   Evaluation Performed:  Follow-up visit  This visit type was conducted due to national recommendations for restrictions regarding the COVID-19 Pandemic (e.g. social distancing).  This format is felt to be most appropriate for this patient at this time.  All issues noted in this document were discussed and addressed.  No physical exam was performed (except for noted visual exam findings with Video Visits).  Please refer to the patient's chart (MyChart message for video visits and phone note for telephone visits) for the patient's consent to telehealth for [facility].  Date:  04/14/2019   ID:  Robert Reilly, DOB May 25, 1941, MRN [id number]  Patient Location:  Home  Provider location:   Home  PCP:  Alroy Dust, L.Marlou Sa, MD  Cardiologist:  Marisa Cyphers Electrophysiologist:  None   Chief Complaint:  Follow up  History of Present Illness:    Robert Reilly is a 78 y.o. male who presents via audio/video conferencing for a telehealth visit today.  Seen for Dr. Marlou Porch. Primarily follows with me.   He has a history of coronary artery disease post myocardial infarction in 2004 with stent to RCA. Other issues include HTN, HLD and RA.He has had some situational stress with being in the caregiver role for his wife.   Seen  back in Mayof 2017 by Dr. Mila Merry noted that after after weight loss, his blood pressure decreased significantly and was able to be offof amlodipine and HCTZ and a lower dose of beta blocker.   [place] him in [place] 2017- using more salt - went for a pre op visit for hernia surgery and BP sky high - Toprol increased back up - I added back HCTZand subsequently Norvase - he eats out most meals due to his wife's situation. Had had surgery on his face for a melanoma. Still with lots of salt and probably too much alcohol. 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 Typically has better outpatient control of his BP. Last visit with me was from December and he was doing ok - weight was back up and he was drinking more alcohol. BP was ok.   The patient does not have symptoms concerning for COVID-19 infection (fever, chills, cough, or new shortness of breath).   Seen today via Facetime video. He has consented for this visit. Has been staying in for the most part - playing some golf and eating more takeout. BP is up this morning - he has not had his medicines yet today. Last night BP was 133/76. No chest pain and not short of breath. Saw PCP last month - no lab. Seeing urology as well for some urinary issues. Biggest issue is his hay fever/sinus issues - says his dad died from polyps in his nose. He does not  like taking Claritin and Benadryl. He admits his weight is up - says it is now coming down and had been higher. He is working in the garden. Overall, no real cardiac concerns.   Past Medical History:  Diagnosis Date  . Acute myocardial infarction of other inferior wall, subsequent episode of care   . Cancer ([place])    melenoma  back , left arm   4-5 yrs ago  . Coronary atherosclerosis of native coronary artery   . GERD (gastroesophageal reflux disease)   . Gout   . History of hiatal hernia   . Hypercholesteremia   . Hypertension   . Old  MI (myocardial infarction) 09/10/2013   2004, stent to RCA  . Osteoarthritis   . Other disorder of calcium metabolism   . Tick bite 05/25/2016   states has had 5 in past month   Past Surgical History:  Procedure Laterality Date  . CARDIAC CATHETERIZATION    . CORONARY ANGIOPLASTY     stent x 1    12-15 yrs ago  . EYE SURGERY Bilateral    cataracts  . INSERTION OF MESH N/A 05/30/2016   Procedure: INSERTION OF MESH;  Surgeon: Jackolyn Confer, MD;  Location: [facility];  Service: General;  Laterality: N/A;  . MELANOMA EXCISION     back, left arm  . SHOULDER ARTHROSCOPY WITH ROTATOR CUFF REPAIR Left   . UMBILICAL HERNIA REPAIR N/A 05/30/2016   Procedure: UMBILICAL HERNIA REPAIR;  Surgeon: Jackolyn Confer, MD;  Location: [facility];  Service: General;  Laterality: N/A;     Current Meds  Medication Sig  . allopurinol (ZYLOPRIM) 100 MG tablet Take 100 mg by mouth 2 (two) times daily.   Marland Kitchen amLODipine (NORVASC) 5 MG tablet TAKE 1 TABLET BY MOUTH DAILY.  Marland Kitchen colchicine 0.6 MG tablet Take 0.6 mg by mouth daily.   . Flaxseed, Linseed, (FLAX SEED OIL) 1000 MG CAPS Take 1,000 mg by mouth 2 (two) times daily.   . hydrochlorothiazide (HYDRODIURIL) 25 MG tablet TAKE 1 TABLET BY MOUTH DAILY.  . meloxicam (MOBIC) 15 MG tablet Take 15 mg by mouth daily.   . metoprolol succinate (TOPROL-XL) 50 MG 24 hr tablet TAKE 1 TABLET BY MOUTH DAILY.  . nitroGLYCERIN (NITROSTAT) 0.4 MG SL tablet Place 1 tablet (0.4 mg total) under the tongue every 5 (five) minutes as needed for chest pain.  Marland Kitchen omeprazole (PRILOSEC) 20 MG capsule TAKE 1 CAPSULE BY MOUTH DAILY  . oxybutynin (DITROPAN-XL) 10 MG 24 hr tablet Take 1 tablet by mouth daily.  . ramipril (ALTACE) 10 MG capsule TAKE 1 CAPSULE BY MOUTH 2 TIMES DAILY.  . rosuvastatin (CRESTOR) 20 MG tablet TAKE 1 TABLET BY MOUTH AT BEDTIME.     Allergies:   Tetracyclines & related and Other   Social History   Tobacco Use  . Smoking status: Former Smoker    Types: Cigarettes     Quit date: 05/25/2001    Years since quitting: 17.8  . Smokeless tobacco: Former Network engineer Use Topics  . Alcohol use: Yes    Comment: 2-3 drinks week- gin  . Drug use: No     Family Hx: The patient's family history includes Cancer in his father; Stroke in his mother.  ROS:   Please see the history of present illness.   All other systems reviewed are negative.    Objective:    Vital Signs:  BP (!) 167/88 Comment: before medication  Temp 97.9 F (36.6 C)   Ht  5' 8.5" (1.74 m)   Wt 191 lb 6.4 oz (86.8 kg)   BMI 28.68 kg/m    Wt Readings from Last 3 Encounters:  04/14/19 191 lb 6.4 oz (86.8 kg)  10/09/18 186 lb (84.4 kg)  04/08/18 175 lb (79.4 kg)    Alert male in no acute distress. Not short of breath with conversation. Weight continues to increase. He is up 16 pounds in over a year.    Labs/Other Tests and Data Reviewed:    Lab Results  Component Value Date   WBC 8.8 10/10/2017   HGB 14.9 10/10/2017   HCT 44.8 10/10/2017   PLT 229 10/10/2017   GLUCOSE 98 10/09/2018   CHOL 121 10/09/2018   TRIG 156 (H) 10/09/2018   HDL 32 (L) 10/09/2018   LDLDIRECT 76.0 03/15/2015   LDLCALC 58 10/09/2018   ALT 30 10/09/2018   AST 33 10/09/2018   NA 140 10/09/2018   K 3.8 10/09/2018   CL 103 10/09/2018   CREATININE 1.06 10/09/2018   BUN 23 10/09/2018   CO2 23 10/09/2018     BNP (last 3 results) No results for input(s): BNP in the last 8760 hours.  ProBNP (last 3 results) No results for input(s): PROBNP in the last 8760 hours.    Prior CV studies:    The following studies were reviewed today:  N/A   ASSESSMENT & PLAN:    1. HTN- BP little high this morning - he has not had his medicines - he tells me it has been ok after medicines are taken - I have asked him to continue to monitor. For now, no changes made.   2. CAD- no active symptoms reported. - Needs to really work on CV risk factor modification which has been hard for him typically.   3. HLD-  remains on statin - he will come and have lab later this week.   4. Weight gain - says his weight was higher and he is trying to get it back down.   5. COVID-19 Education: The signs and symptoms of COVID-19 were discussed with the patient and how to seek care for testing (follow up with PCP or arrange E-visit).  The importance of social distancing, staying at home, hand hygiene and wearing a mask when out in public were discussed today.  Patient Risk:   After full review of this patient's clinical status, I feel that they are at least moderate risk at this time.  Time:   Today, I have spent 9 minutes with the patient with telehealth technology discussing the above issues.     Medication Adjustments/Labs and Tests Ordered: Current medicines are reviewed at length with the patient today.  Concerns regarding medicines are outlined above.   Tests Ordered: Orders Placed This Encounter  Procedures  . Basic metabolic panel  . CBC  . Hepatic function panel  . Lipid panel    Medication Changes: No orders of the defined types were placed in this encounter.   Disposition:  FU with me in 6 months.   Patient is agreeable to this plan and will call if any problems develop in the interim.   Amie Critchley, NP  04/14/2019 8:29 AM    [facility]

## 2019-04-14 ENCOUNTER — Encounter: Payer: Self-pay | Admitting: Nurse Practitioner

## 2019-04-14 ENCOUNTER — Other Ambulatory Visit: Payer: Self-pay

## 2019-04-14 ENCOUNTER — Telehealth: Payer: Self-pay | Admitting: *Deleted

## 2019-04-14 ENCOUNTER — Telehealth (INDEPENDENT_AMBULATORY_CARE_PROVIDER_SITE_OTHER): Payer: Managed Care, Other (non HMO) | Admitting: Nurse Practitioner

## 2019-04-14 VITALS — BP 167/88 | Temp 97.9°F | Ht 68.5 in | Wt 191.4 lb

## 2019-04-14 DIAGNOSIS — Z7189 Other specified counseling: Secondary | ICD-10-CM

## 2019-04-14 DIAGNOSIS — I10 Essential (primary) hypertension: Secondary | ICD-10-CM | POA: Diagnosis not present

## 2019-04-14 DIAGNOSIS — I2510 Atherosclerotic heart disease of native coronary artery without angina pectoris: Secondary | ICD-10-CM

## 2019-04-14 DIAGNOSIS — E7800 Pure hypercholesterolemia, unspecified: Secondary | ICD-10-CM

## 2019-04-14 NOTE — Patient Instructions (Addendum)
After Visit Summary:  We will be checking the following labs today - NONE  Fasting labs this Thursday - BMET, CBC, HPF and lipids   Medication Instructions:    Continue with your current medicines.    If you need a refill on your cardiac medications before your next appointment, please call your pharmacy.     Testing/Procedures To Be Arranged:  N/A  Follow-Up:   See me in 6 months    At [facility], you and your health needs are our priority.  As part of our continuing mission to provide you with exceptional heart care, we have created designated Provider Care Teams.  These Care Teams include your primary Cardiologist (physician) and Advanced Practice Providers (APPs -  Physician Assistants and Nurse Practitioners) who all work together to provide you with the care you need, when you need it.  Special Instructions:  . Stay safe, stay home, wash your hands for at least 20 seconds and wear a mask when out in public.  . It was good to talk with you today.  Marland Kitchen Keep a check on your BP for me . Keep working on diet/exercise/weight loss.    Call the [place] office at [phone] if you have any questions, problems or concerns.

## 2019-04-14 NOTE — Telephone Encounter (Signed)
Virtual Visit Pre-Appointment Phone Call  "(Name), I am calling you today to discuss your upcoming appointment. We are currently trying to limit exposure to the virus that causes COVID-19 by seeing patients at home rather than in the office."  1. "What is the BEST phone number to call the day of the visit?" - include this in appointment notes  2. "Do you have or have access to (through a family member/friend) a smartphone with video capability that we can use for your visit?" a. If yes - list this number in appt notes as "cell" (if different from BEST phone #) and list the appointment type as a VIDEO visit in appointment notes b. If no - list the appointment type as a PHONE visit in appointment notes  3. Confirm consent - "In the setting of the current Covid19 crisis, you are scheduled for a (phone or video) visit with your provider on (Monday, June 15) at (8:00 am ).  Just as we do with many in-office visits, in order for you to participate in this visit, we must obtain consent.  If you'd like, I can send this to your mychart (if signed up) or email for you to review.  Otherwise, I can obtain your verbal consent now.  All virtual visits are billed to your insurance company just like a normal visit would be.  By agreeing to a virtual visit, we'd like you to understand that the technology does not allow for your provider to perform an examination, and thus may limit your provider's ability to fully assess your condition. If your provider identifies any concerns that need to be evaluated in person, we will make arrangements to do so.  Finally, though the technology is pretty good, we cannot assure that it will always work on either your or our end, and in the setting of a video visit, we may have to convert it to a phone-only visit.  In either situation, we cannot ensure that we have a secure connection.  Are you willing to proceed?" STAFF: Did the patient verbally acknowledge consent to telehealth  visit? Document YES/NO here: YES.  4. Advise patient to be prepared - "Two hours prior to your appointment, go ahead and check your blood pressure, pulse, oxygen saturation, and your weight (if you have the equipment to check those) and write them all down. When your visit starts, your provider will ask you for this information. If you have an Apple Watch or Kardia device, please plan to have heart rate information ready on the day of your appointment. Please have a pen and paper handy nearby the day of the visit as well."  5. Give patient instructions for MyChart download to smartphone OR [email or web address] as below if video visit (depending on what platform provider is using)  6. Inform patient they will receive a phone call 15 minutes prior to their appointment time (may be from unknown caller ID) so they should be prepared to answer    TELEPHONE CALL NOTE  Robert Reilly has been deemed a candidate for a follow-up tele-health visit to limit community exposure during the Covid-19 pandemic. I spoke with the patient via phone to ensure availability of phone/video source, confirm preferred email & phone number, and discuss instructions and expectations.  I reminded Robert Reilly to be prepared with any vital sign and/or heart rhythm information that could potentially be obtained via home monitoring, at the time of his visit. I reminded Robert Reilly to expect a  phone call prior to his visit.  Robert Reilly 04/14/2019 7:51 AM   INSTRUCTIONS FOR DOWNLOADING THE MYCHART APP TO SMARTPHONE  - The patient must first make sure to have activated MyChart and know their login information - If Apple, go to App Store and type in MyChart in the search bar and download the app. If Android, ask patient to go to [employer] and type in [place] in the search bar and download the app. The app is free but as with any other app downloads, their phone may require them to verify saved payment information or  Apple/Android password.  - The patient will need to then log into the app with their MyChart username and password, and select [facility] as their healthcare provider to link the account. When it is time for your visit, go to the MyChart app, find appointments, and click Begin Video Visit. Be sure to Select Allow for your device to access the Microphone and Camera for your visit. You will then be connected, and your provider will be with you shortly.  **If they have any issues connecting, or need assistance please contact MyChart service desk [phone])**  **If using a computer, in order to ensure the best quality for their visit they will need to use either of the following Internet Browsers: [employer], or Google Chrome**  IF USING DOXIMITY or DOXY.ME - The patient will receive a link just prior to their visit by text.     FULL LENGTH CONSENT FOR TELE-HEALTH VISIT   I hereby voluntarily request, consent and authorize [facility] and its employed or contracted physicians, physician assistants, nurse practitioners or other licensed health care professionals (the Practitioner), to provide me with telemedicine health care services (the "Services") as deemed necessary by the treating Practitioner. I acknowledge and consent to receive the Services by the Practitioner via telemedicine. I understand that the telemedicine visit will involve communicating with the Practitioner through live audiovisual communication technology and the disclosure of certain medical information by electronic transmission. I acknowledge that I have been given the opportunity to request an in-person assessment or other available alternative prior to the telemedicine visit and am voluntarily participating in the telemedicine visit.  I understand that I have the right to withhold or withdraw my consent to the use of telemedicine in the course of my care at any time, without affecting my right to future care  or treatment, and that the Practitioner or I may terminate the telemedicine visit at any time. I understand that I have the right to inspect all information obtained and/or recorded in the course of the telemedicine visit and may receive copies of available information for a reasonable fee.  I understand that some of the potential risks of receiving the Services via telemedicine include:  Marland Kitchen Delay or interruption in medical evaluation due to technological equipment failure or disruption; . Information transmitted may not be sufficient (e.g. poor resolution of images) to allow for appropriate medical decision making by the Practitioner; and/or  . In rare instances, security protocols could fail, causing a breach of personal health information.  Furthermore, I acknowledge that it is my responsibility to provide information about my medical history, conditions and care that is complete and accurate to the best of my ability. I acknowledge that Practitioner's advice, recommendations, and/or decision may be based on factors not within their control, such as incomplete or inaccurate data provided by me or distortions of diagnostic images or specimens that may result  from electronic transmissions. I understand that the practice of medicine is not an exact science and that Practitioner makes no warranties or guarantees regarding treatment outcomes. I acknowledge that I will receive a copy of this consent concurrently upon execution via email to the email address I last provided but may also request a printed copy by calling the office of [facility].    I understand that my insurance will be billed for this visit.   I have read or had this consent read to me. . I understand the contents of this consent, which adequately explains the benefits and risks of the Services being provided via telemedicine.  . I have been provided ample opportunity to ask questions regarding this consent and the Services and have had  my questions answered to my satisfaction. . I give my informed consent for the services to be provided through the use of telemedicine in my medical care  By participating in this telemedicine visit I agree to the above.

## 2019-04-14 NOTE — Telephone Encounter (Signed)

## 2019-04-17 ENCOUNTER — Other Ambulatory Visit: Payer: Managed Care, Other (non HMO) | Admitting: *Deleted

## 2019-04-17 ENCOUNTER — Other Ambulatory Visit: Payer: Self-pay

## 2019-04-17 DIAGNOSIS — I10 Essential (primary) hypertension: Secondary | ICD-10-CM

## 2019-04-17 DIAGNOSIS — E7800 Pure hypercholesterolemia, unspecified: Secondary | ICD-10-CM

## 2019-04-17 DIAGNOSIS — I2510 Atherosclerotic heart disease of native coronary artery without angina pectoris: Secondary | ICD-10-CM

## 2019-04-17 LAB — BASIC METABOLIC PANEL
BUN/Creatinine Ratio: 20 (ref 10–24)
BUN: 24 mg/dL (ref 8–27)
CO2: 23 mmol/L (ref 20–29)
Calcium: 9.5 mg/dL (ref 8.6–10.2)
Chloride: 105 mmol/L (ref 96–106)
Creatinine, Ser: 1.23 mg/dL (ref 0.76–1.27)
GFR calc Af Amer: 66 mL/min/{1.73_m2} (ref >59)
GFR calc non Af Amer: 57 mL/min/{1.73_m2} — ABNORMAL LOW (ref >59)
Glucose: 112 mg/dL — ABNORMAL HIGH (ref 65–99)
Potassium: 4 mmol/L (ref 3.5–5.2)
Sodium: 142 mmol/L (ref 134–144)

## 2019-04-17 LAB — CBC
Hematocrit: 45 % (ref 37.5–51.0)
Hemoglobin: 15.2 g/dL (ref 13.0–17.7)
MCH: 32.3 pg (ref 26.6–33.0)
MCHC: 33.8 g/dL (ref 31.5–35.7)
MCV: 96 fL (ref 79–97)
Platelets: 192 10*3/uL (ref 150–450)
RBC: 4.71 x10E6/uL (ref 4.14–5.80)
RDW: 13.5 % (ref 11.6–15.4)
WBC: 8.4 10*3/uL (ref 3.4–10.8)

## 2019-04-17 LAB — LIPID PANEL
Chol/HDL Ratio: 3.8 ratio (ref 0.0–5.0)
Cholesterol, Total: 121 mg/dL (ref 100–199)
HDL: 32 mg/dL — ABNORMAL LOW (ref >39)
LDL Calculated: 52 mg/dL (ref 0–99)
Triglycerides: 186 mg/dL — ABNORMAL HIGH (ref 0–149)
VLDL Cholesterol Cal: 37 mg/dL (ref 5–40)

## 2019-04-17 LAB — HEPATIC FUNCTION PANEL
ALT: 19 IU/L (ref 0–44)
AST: 22 IU/L (ref 0–40)
Albumin: 4.2 g/dL (ref 3.7–4.7)
Alkaline Phosphatase: 49 IU/L (ref 39–117)
Bilirubin Total: 0.4 mg/dL (ref 0.0–1.2)
Bilirubin, Direct: 0.13 mg/dL (ref 0.00–0.40)
Total Protein: 6.8 g/dL (ref 6.0–8.5)

## 2019-06-21 ENCOUNTER — Other Ambulatory Visit: Payer: Self-pay | Admitting: Nurse Practitioner

## 2019-07-19 ENCOUNTER — Other Ambulatory Visit: Payer: Self-pay | Admitting: Nurse Practitioner

## 2019-08-02 ENCOUNTER — Other Ambulatory Visit: Payer: Self-pay | Admitting: Cardiology

## 2019-08-11 ENCOUNTER — Other Ambulatory Visit: Payer: Self-pay

## 2019-08-11 DIAGNOSIS — Z20822 Contact with and (suspected) exposure to covid-19: Secondary | ICD-10-CM

## 2019-08-12 LAB — NOVEL CORONAVIRUS, NAA: SARS-CoV-2, NAA: NOT DETECTED

## 2019-10-11 NOTE — Progress Notes (Signed)
CARDIOLOGY OFFICE NOTE  Date:  10/14/2019    Robert Reilly Date of Birth: Apr 10, 1941 Medical Record [id number]  PCP:  Robert Dust, L.Marlou Sa, MD  Cardiologist:  Servando Snare [facility]   Chief Complaint  Patient presents with  . Follow-up    6 month check. Seen for Dr. Marlou Porch    History of Present Illness: Robert Reilly is a 78 y.o. male who presents today for a 6 month check.  Seen for Dr. Marlou Porch.Primarily follows with me.  He has a history of coronary artery disease post myocardial infarction in 2004 with stent to RCA. Other issues include HTN, HLD and RA.He has had some situational stress with being in the caregiver role for his wife.   Seen back in Mayof 2017 by Dr. Mila Merry noted that after after weight loss, his blood pressure decreased significantly and was able to be offof amlodipine and HCTZ and a lower dose of beta blocker.   [place] him in [place] 2017- using more salt - went for a pre op visit for hernia surgery and BP sky high - Toprol increased back up - I added back HCTZand subsequently Norvase - he eats out most meals due to his wife's situation. Had had surgery on his face for a melanoma. Still with lots of salt and probably too much alcohol. 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 Typically has better outpatient control of his BP.Last visit with me in the office was from December and he was doing ok - weight was back up and he was drinking more alcohol. BP was ok. We did a telehealth visit back in May - felt to be doing ok from cardiac standpoint. Having more allergy/sinus issues.   The patient does not have symptoms concerning for COVID-19 infection (fever, chills, cough, or new shortness of breath).   Comes in today. Here alone. He had to walk 3 flights of stairs since our elevators are out of service. Lots of questions. Taking aspirin twice a day - not sure why. Has gained weight. He did go to  [place] - he was not aware that there was COVID there - but did get tested and socially distanced while hunting. Has not been sick. No chest pain. Breathing is ok. He has no real concerns. BP is up but just took his medicines prior to coming in this morning.   Past Medical History:  Diagnosis Date  . Acute myocardial infarction of other inferior wall, subsequent episode of care   . Cancer ([place])    melenoma  back , left arm   4-5 yrs ago  . Coronary atherosclerosis of native coronary artery   . GERD (gastroesophageal reflux disease)   . Gout   . History of hiatal hernia   . Hypercholesteremia   . Hypertension   . Old MI (myocardial infarction) 09/10/2013   2004, stent to RCA  . Osteoarthritis   . Other disorder of calcium metabolism   . Tick bite 05/25/2016   states has had 5 in past month    Past Surgical History:  Procedure Laterality Date  . CARDIAC CATHETERIZATION    . CORONARY ANGIOPLASTY     stent x 1    12-15 yrs ago  . EYE SURGERY Bilateral    cataracts  . INSERTION OF MESH N/A 05/30/2016   Procedure: INSERTION OF MESH;  Surgeon: Jackolyn Confer, MD;  Location: [facility];  Service: General;  Laterality:  N/A;  . MELANOMA EXCISION     back, left arm  . SHOULDER ARTHROSCOPY WITH ROTATOR CUFF REPAIR Left   . UMBILICAL HERNIA REPAIR N/A 05/30/2016   Procedure: UMBILICAL HERNIA REPAIR;  Surgeon: Jackolyn Confer, MD;  Location: [facility];  Service: General;  Laterality: N/A;     Medications: Current Meds  Medication Sig  . allopurinol (ZYLOPRIM) 100 MG tablet Take 100 mg by mouth 2 (two) times daily.   Marland Kitchen amLODipine (NORVASC) 5 MG tablet TAKE 1 TABLET BY MOUTH DAILY.  Marland Kitchen aspirin EC 81 MG tablet Take 81 mg by mouth daily.  . colchicine 0.6 MG tablet Take 0.6 mg by mouth daily.   . Flaxseed, Linseed, (FLAX SEED OIL) 1000 MG CAPS Take 1,000 mg by mouth 2 (two) times daily.   . hydrochlorothiazide (HYDRODIURIL) 25 MG tablet TAKE 1 TABLET BY MOUTH DAILY.  . meloxicam (MOBIC) 15 MG  tablet Take 15 mg by mouth daily.   . metoprolol succinate (TOPROL-XL) 50 MG 24 hr tablet TAKE 1 TABLET BY MOUTH DAILY.  . nitroGLYCERIN (NITROSTAT) 0.4 MG SL tablet Place 1 tablet (0.4 mg total) under the tongue every 5 (five) minutes as needed for chest pain.  Marland Kitchen omeprazole (PRILOSEC) 20 MG capsule TAKE 1 CAPSULE BY MOUTH DAILY  . ramipril (ALTACE) 10 MG capsule TAKE 1 CAPSULE BY MOUTH 2 TIMES DAILY.  . rosuvastatin (CRESTOR) 20 MG tablet TAKE 1 TABLET BY MOUTH AT BEDTIME.  Marland Kitchen solifenacin (VESICARE) 5 MG tablet Take 5 mg by mouth daily.     Allergies: Allergies  Allergen Reactions  . Tetracyclines & Related Rash  . Other Other (See Comments)    No meat: Came from a tick bite     Social History: The patient  reports that he quit smoking about 18 years ago. His smoking use included cigarettes. He has quit using smokeless tobacco. He reports current alcohol use. He reports that he does not use drugs.   Family History: The patient's family history includes Cancer in his father; Stroke in his mother.   Review of Systems: Please see the history of present illness.   All other systems are reviewed and negative.   Physical Exam: VS:  BP (!) 150/94 (BP Location: Left Arm, Patient Position: Sitting, Cuff Size: Normal)   Pulse 60   Ht 5' 8.5" (1.74 m)   Wt 196 lb 12.8 oz (89.3 kg)   BMI 29.49 kg/m  .  BMI Body mass index is 29.49 kg/m.  Wt Readings from Last 3 Encounters:  10/14/19 196 lb 12.8 oz (89.3 kg)  04/14/19 191 lb 6.4 oz (86.8 kg)  10/09/18 186 lb (84.4 kg)   BP is 132/80 by me during the visit.  General: Pleasant. Alert. He has gained 10 pounds since our last visit here a year ago in the office.  HEENT: Normal.  Neck: Supple, no JVD, carotid bruits, or masses noted.  Cardiac: Regular rate and rhythm. No murmurs, rubs, or gallops. No edema.  Respiratory:  Lungs are clear to auscultation bilaterally with normal work of breathing.  GI: Soft and nontender.  MS: No deformity  or atrophy. Gait and ROM intact.  Skin: Warm and dry. Color is normal.  Neuro:  Strength and sensation are intact and no gross focal deficits noted.  Psych: Alert, appropriate and with normal affect.   LABORATORY DATA:  EKG:  EKG is ordered today. This demonstrates NSR with inferior Q's noted - HR is 60.  Lab Results  Component Value Date  WBC 8.4 04/17/2019   HGB 15.2 04/17/2019   HCT 45.0 04/17/2019   PLT 192 04/17/2019   GLUCOSE 112 (H) 04/17/2019   CHOL 121 04/17/2019   TRIG 186 (H) 04/17/2019   HDL 32 (L) 04/17/2019   LDLDIRECT 76.0 03/15/2015   LDLCALC 52 04/17/2019   ALT 19 04/17/2019   AST 22 04/17/2019   NA 142 04/17/2019   K 4.0 04/17/2019   CL 105 04/17/2019   CREATININE 1.23 04/17/2019   BUN 24 04/17/2019   CO2 23 04/17/2019     BNP (last 3 results) No results for input(s): BNP in the last 8760 hours.  ProBNP (last 3 results) No results for input(s): PROBNP in the last 8760 hours.   Other Studies Reviewed Today:   Assessment/Plan:  1. HTN - recheck by me is ok - I have left him on his current regimen. No changes made today. Lab today. Needs to restrict salt and work on weight loss.   2. CAD - remote MI/PCI to the RCA from 2004 - managed medically - ok to take aspirin just once a day - needs aggressive CV risk factor modification.    3. HLD - on statin - labs today.   4. Weight gain - due to inactivity and the "staying at home" and eating out.   5. Gout - on therapy.   6. OA - on chronic Mobic - not recommended long term from our standpoint.   7. COVID-19 Education: The signs and symptoms of COVID-19 were discussed with the patient and how to seek care for testing (follow up with PCP or arrange E-visit).  The importance of social distancing, staying at home, hand hygiene and wearing a mask when out in public were discussed today.  Current medicines are reviewed with the patient today.  The patient does not have concerns regarding medicines other  than what has been noted above.  The following changes have been made:  See above.  Labs/ tests ordered today include:    Orders Placed This Encounter  Procedures  . Basic metabolic panel  . CBC no Diff  . Hepatic function panel  . Lipid Profile  . EKG 12-Lead     Disposition:   FU with me in 6 months. Labs today..    Patient is agreeable to this plan and will call if any problems develop in the interim.   SignedTruitt Merle, NP  10/14/2019 8:41 AM  [facility] [address] [place], [place]  [address] Phone: [phone] Fax: [phone]

## 2019-10-14 ENCOUNTER — Ambulatory Visit: Payer: Managed Care, Other (non HMO) | Admitting: Nurse Practitioner

## 2019-10-14 ENCOUNTER — Encounter: Payer: Self-pay | Admitting: Nurse Practitioner

## 2019-10-14 ENCOUNTER — Other Ambulatory Visit: Payer: Self-pay

## 2019-10-14 VITALS — BP 150/94 | HR 60 | Ht 68.5 in | Wt 196.8 lb

## 2019-10-14 DIAGNOSIS — I2510 Atherosclerotic heart disease of native coronary artery without angina pectoris: Secondary | ICD-10-CM

## 2019-10-14 DIAGNOSIS — I10 Essential (primary) hypertension: Secondary | ICD-10-CM

## 2019-10-14 DIAGNOSIS — E7800 Pure hypercholesterolemia, unspecified: Secondary | ICD-10-CM | POA: Diagnosis not present

## 2019-10-14 LAB — HEPATIC FUNCTION PANEL
ALT: 19 IU/L (ref 0–44)
AST: 19 IU/L (ref 0–40)
Albumin: 3.8 g/dL (ref 3.7–4.7)
Alkaline Phosphatase: 54 IU/L (ref 39–117)
Bilirubin Total: 0.3 mg/dL (ref 0.0–1.2)
Bilirubin, Direct: 0.11 mg/dL (ref 0.00–0.40)
Total Protein: 6.8 g/dL (ref 6.0–8.5)

## 2019-10-14 LAB — BASIC METABOLIC PANEL
BUN/Creatinine Ratio: 21 (ref 10–24)
BUN: 22 mg/dL (ref 8–27)
CO2: 25 mmol/L (ref 20–29)
Calcium: 9.1 mg/dL (ref 8.6–10.2)
Chloride: 104 mmol/L (ref 96–106)
Creatinine, Ser: 1.06 mg/dL (ref 0.76–1.27)
GFR calc Af Amer: 78 mL/min/{1.73_m2} (ref >59)
GFR calc non Af Amer: 67 mL/min/{1.73_m2} (ref >59)
Glucose: 111 mg/dL — ABNORMAL HIGH (ref 65–99)
Potassium: 3.7 mmol/L (ref 3.5–5.2)
Sodium: 141 mmol/L (ref 134–144)

## 2019-10-14 LAB — CBC
Hematocrit: 44.1 % (ref 37.5–51.0)
Hemoglobin: 15.3 g/dL (ref 13.0–17.7)
MCH: 33.7 pg — ABNORMAL HIGH (ref 26.6–33.0)
MCHC: 34.7 g/dL (ref 31.5–35.7)
MCV: 97 fL (ref 79–97)
Platelets: 204 10*3/uL (ref 150–450)
RBC: 4.54 x10E6/uL (ref 4.14–5.80)
RDW: 13.3 % (ref 11.6–15.4)
WBC: 7 10*3/uL (ref 3.4–10.8)

## 2019-10-14 LAB — LIPID PANEL
Chol/HDL Ratio: 3.4 ratio (ref 0.0–5.0)
Cholesterol, Total: 124 mg/dL (ref 100–199)
HDL: 37 mg/dL — ABNORMAL LOW (ref >39)
LDL Chol Calc (NIH): 63 mg/dL (ref 0–99)
Triglycerides: 137 mg/dL (ref 0–149)
VLDL Cholesterol Cal: 24 mg/dL (ref 5–40)

## 2019-10-14 NOTE — Patient Instructions (Addendum)
After Visit Summary:  We will be checking the following labs today - BMET, CBC, HPF and Lipids   Medication Instructions:    Continue with your current medicines.  Ok to cut aspirin back to just one a day    If you need a refill on your cardiac medications before your next appointment, please call your pharmacy.     Testing/Procedures To Be Arranged:  N/A  Follow-Up:   See me in 6 months    At [facility], you and your health needs are our priority.  As part of our continuing mission to provide you with exceptional heart care, we have created designated Provider Care Teams.  These Care Teams include your primary Cardiologist (physician) and Advanced Practice Providers (APPs -  Physician Assistants and Nurse Practitioners) who all work together to provide you with the care you need, when you need it.  Special Instructions:  . Stay safe, stay home, wash your hands for at least 20 seconds and wear a mask when out in public.  . It was good to talk with you today.  . Think about what we talked about today.    Call the [place] office at [phone] if you have any questions, problems or concerns.

## 2019-10-27 ENCOUNTER — Other Ambulatory Visit: Payer: Self-pay

## 2019-10-27 ENCOUNTER — Inpatient Hospital Stay (HOSPITAL_COMMUNITY)
Admission: EM | Admit: 2019-10-27 | Discharge: 2019-10-29 | DRG: 247 | Disposition: A | Discharge status: Home / Self Care | Payer: Managed Care, Other (non HMO) | Attending: Cardiology | Admitting: Cardiology

## 2019-10-27 ENCOUNTER — Emergency Department (HOSPITAL_COMMUNITY): Payer: Managed Care, Other (non HMO)

## 2019-10-27 ENCOUNTER — Encounter (HOSPITAL_COMMUNITY): Payer: Self-pay | Admitting: Emergency Medicine

## 2019-10-27 DIAGNOSIS — M109 Gout, unspecified: Secondary | ICD-10-CM | POA: Diagnosis present

## 2019-10-27 DIAGNOSIS — Z955 Presence of coronary angioplasty implant and graft: Secondary | ICD-10-CM

## 2019-10-27 DIAGNOSIS — I119 Hypertensive heart disease without heart failure: Secondary | ICD-10-CM | POA: Diagnosis present

## 2019-10-27 DIAGNOSIS — I25110 Atherosclerotic heart disease of native coronary artery with unstable angina pectoris: Secondary | ICD-10-CM

## 2019-10-27 DIAGNOSIS — I10 Essential (primary) hypertension: Secondary | ICD-10-CM | POA: Diagnosis present

## 2019-10-27 DIAGNOSIS — I700 Atherosclerosis of aorta: Secondary | ICD-10-CM | POA: Diagnosis present

## 2019-10-27 DIAGNOSIS — Z791 Long term (current) use of non-steroidal anti-inflammatories (NSAID): Secondary | ICD-10-CM

## 2019-10-27 DIAGNOSIS — K219 Gastro-esophageal reflux disease without esophagitis: Secondary | ICD-10-CM | POA: Diagnosis present

## 2019-10-27 DIAGNOSIS — R072 Precordial pain: Secondary | ICD-10-CM

## 2019-10-27 DIAGNOSIS — Z20828 Contact with and (suspected) exposure to other viral communicable diseases: Secondary | ICD-10-CM | POA: Diagnosis present

## 2019-10-27 DIAGNOSIS — Z7982 Long term (current) use of aspirin: Secondary | ICD-10-CM

## 2019-10-27 DIAGNOSIS — Z79899 Other long term (current) drug therapy: Secondary | ICD-10-CM

## 2019-10-27 DIAGNOSIS — I252 Old myocardial infarction: Secondary | ICD-10-CM

## 2019-10-27 DIAGNOSIS — I200 Unstable angina: Secondary | ICD-10-CM | POA: Diagnosis present

## 2019-10-27 DIAGNOSIS — M069 Rheumatoid arthritis, unspecified: Secondary | ICD-10-CM | POA: Diagnosis present

## 2019-10-27 DIAGNOSIS — E785 Hyperlipidemia, unspecified: Secondary | ICD-10-CM | POA: Diagnosis present

## 2019-10-27 DIAGNOSIS — Z87891 Personal history of nicotine dependence: Secondary | ICD-10-CM

## 2019-10-27 DIAGNOSIS — Z881 Allergy status to other antibiotic agents status: Secondary | ICD-10-CM

## 2019-10-27 DIAGNOSIS — Z85820 Personal history of malignant melanoma of skin: Secondary | ICD-10-CM

## 2019-10-27 DIAGNOSIS — I160 Hypertensive urgency: Secondary | ICD-10-CM | POA: Diagnosis present

## 2019-10-27 DIAGNOSIS — M1990 Unspecified osteoarthritis, unspecified site: Secondary | ICD-10-CM | POA: Diagnosis present

## 2019-10-27 LAB — CBC
HCT: 45.3 % (ref 39.0–52.0)
Hemoglobin: 15.1 g/dL (ref 13.0–17.0)
MCH: 33 pg (ref 26.0–34.0)
MCHC: 33.3 g/dL (ref 30.0–36.0)
MCV: 99.1 fL (ref 80.0–100.0)
Platelets: 211 10*3/uL (ref 150–400)
RBC: 4.57 MIL/uL (ref 4.22–5.81)
RDW: 13.8 % (ref 11.5–15.5)
WBC: 8 10*3/uL (ref 4.0–10.5)
nRBC: 0 % (ref 0.0–0.2)

## 2019-10-27 NOTE — ED Triage Notes (Signed)
Pt brought to ED by [facility] from home for c/o CP since 1700 today, pt took 324 mg ASA prior to EMS arrival to his house with pain relieve. SR on monitor with frequent PVC's, BP 175/93, HR 64, R-18, 99% RA, CBG 122

## 2019-10-28 ENCOUNTER — Inpatient Hospital Stay (HOSPITAL_COMMUNITY): Payer: Managed Care, Other (non HMO)

## 2019-10-28 ENCOUNTER — Inpatient Hospital Stay (HOSPITAL_COMMUNITY)
Admission: EM | Disposition: A | Discharge status: Home / Self Care | Payer: Self-pay | Source: Home / Self Care | Attending: Cardiology

## 2019-10-28 ENCOUNTER — Encounter (HOSPITAL_COMMUNITY): Payer: Self-pay | Admitting: Cardiology

## 2019-10-28 DIAGNOSIS — Z87891 Personal history of nicotine dependence: Secondary | ICD-10-CM | POA: Diagnosis not present

## 2019-10-28 DIAGNOSIS — Z955 Presence of coronary angioplasty implant and graft: Secondary | ICD-10-CM | POA: Diagnosis not present

## 2019-10-28 DIAGNOSIS — M1990 Unspecified osteoarthritis, unspecified site: Secondary | ICD-10-CM | POA: Diagnosis present

## 2019-10-28 DIAGNOSIS — Z791 Long term (current) use of non-steroidal anti-inflammatories (NSAID): Secondary | ICD-10-CM | POA: Diagnosis not present

## 2019-10-28 DIAGNOSIS — M069 Rheumatoid arthritis, unspecified: Secondary | ICD-10-CM | POA: Diagnosis present

## 2019-10-28 DIAGNOSIS — Z7982 Long term (current) use of aspirin: Secondary | ICD-10-CM | POA: Diagnosis not present

## 2019-10-28 DIAGNOSIS — Z20828 Contact with and (suspected) exposure to other viral communicable diseases: Secondary | ICD-10-CM | POA: Diagnosis present

## 2019-10-28 DIAGNOSIS — I110 Hypertensive heart disease with heart failure: Secondary | ICD-10-CM | POA: Diagnosis not present

## 2019-10-28 DIAGNOSIS — Z79899 Other long term (current) drug therapy: Secondary | ICD-10-CM | POA: Diagnosis not present

## 2019-10-28 DIAGNOSIS — I119 Hypertensive heart disease without heart failure: Secondary | ICD-10-CM | POA: Diagnosis present

## 2019-10-28 DIAGNOSIS — I5030 Unspecified diastolic (congestive) heart failure: Secondary | ICD-10-CM

## 2019-10-28 DIAGNOSIS — R072 Precordial pain: Secondary | ICD-10-CM | POA: Diagnosis present

## 2019-10-28 DIAGNOSIS — R079 Chest pain, unspecified: Secondary | ICD-10-CM

## 2019-10-28 DIAGNOSIS — I25110 Atherosclerotic heart disease of native coronary artery with unstable angina pectoris: Secondary | ICD-10-CM | POA: Diagnosis present

## 2019-10-28 DIAGNOSIS — I200 Unstable angina: Secondary | ICD-10-CM | POA: Diagnosis not present

## 2019-10-28 DIAGNOSIS — I700 Atherosclerosis of aorta: Secondary | ICD-10-CM | POA: Diagnosis present

## 2019-10-28 DIAGNOSIS — Z881 Allergy status to other antibiotic agents status: Secondary | ICD-10-CM | POA: Diagnosis not present

## 2019-10-28 DIAGNOSIS — I160 Hypertensive urgency: Secondary | ICD-10-CM | POA: Diagnosis present

## 2019-10-28 DIAGNOSIS — I252 Old myocardial infarction: Secondary | ICD-10-CM | POA: Diagnosis not present

## 2019-10-28 DIAGNOSIS — E785 Hyperlipidemia, unspecified: Secondary | ICD-10-CM

## 2019-10-28 DIAGNOSIS — Z85820 Personal history of malignant melanoma of skin: Secondary | ICD-10-CM | POA: Diagnosis not present

## 2019-10-28 DIAGNOSIS — M109 Gout, unspecified: Secondary | ICD-10-CM | POA: Diagnosis present

## 2019-10-28 DIAGNOSIS — E782 Mixed hyperlipidemia: Secondary | ICD-10-CM | POA: Diagnosis not present

## 2019-10-28 DIAGNOSIS — K219 Gastro-esophageal reflux disease without esophagitis: Secondary | ICD-10-CM | POA: Diagnosis present

## 2019-10-28 HISTORY — PX: LEFT HEART CATH AND CORONARY ANGIOGRAPHY: CATH118249

## 2019-10-28 HISTORY — PX: CORONARY STENT INTERVENTION: CATH118234

## 2019-10-28 LAB — TSH: TSH: 4.601 u[IU]/mL — ABNORMAL HIGH (ref 0.350–4.500)

## 2019-10-28 LAB — HEPATIC FUNCTION PANEL
ALT: 25 U/L (ref 0–44)
AST: 25 U/L (ref 15–41)
Albumin: 3.8 g/dL (ref 3.5–5.0)
Alkaline Phosphatase: 42 U/L (ref 38–126)
Bilirubin, Direct: 0.1 mg/dL (ref 0.0–0.2)
Indirect Bilirubin: 0.9 mg/dL (ref 0.3–0.9)
Total Bilirubin: 1 mg/dL (ref 0.3–1.2)
Total Protein: 7.1 g/dL (ref 6.5–8.1)

## 2019-10-28 LAB — BASIC METABOLIC PANEL
Anion gap: 9 (ref 5–15)
BUN: 19 mg/dL (ref 8–23)
CO2: 28 mmol/L (ref 22–32)
Calcium: 9.5 mg/dL (ref 8.9–10.3)
Chloride: 107 mmol/L (ref 98–111)
Creatinine, Ser: 1.16 mg/dL (ref 0.61–1.24)
GFR calc Af Amer: >60 mL/min (ref >60)
GFR calc non Af Amer: >60 mL/min (ref >60)
Glucose, Bld: 82 mg/dL (ref 70–99)
Potassium: 3.4 mmol/L — ABNORMAL LOW (ref 3.5–5.1)
Sodium: 144 mmol/L (ref 135–145)

## 2019-10-28 LAB — POCT ACTIVATED CLOTTING TIME
Activated Clotting Time: 246 seconds
Activated Clotting Time: 246 seconds

## 2019-10-28 LAB — HEMOGLOBIN A1C
Hgb A1c MFr Bld: 5.9 % — ABNORMAL HIGH (ref 4.8–5.6)
Mean Plasma Glucose: 122.63 mg/dL

## 2019-10-28 LAB — TROPONIN I (HIGH SENSITIVITY)
Troponin I (High Sensitivity): 30 ng/L — ABNORMAL HIGH (ref <18)
Troponin I (High Sensitivity): 7 ng/L (ref <18)
Troponin I (High Sensitivity): 7 ng/L (ref <18)
Troponin I (High Sensitivity): 7 ng/L (ref <18)

## 2019-10-28 LAB — LIPASE, BLOOD: Lipase: 33 U/L (ref 11–51)

## 2019-10-28 LAB — ECHOCARDIOGRAM COMPLETE
Height: 68 in
Weight: 3120 oz

## 2019-10-28 LAB — RESPIRATORY PANEL BY RT PCR (FLU A&B, COVID)
Influenza A by PCR: NEGATIVE
Influenza B by PCR: NEGATIVE
SARS Coronavirus 2 by RT PCR: NEGATIVE

## 2019-10-28 LAB — MAGNESIUM: Magnesium: 1.6 mg/dL — ABNORMAL LOW (ref 1.7–2.4)

## 2019-10-28 LAB — T4, FREE: Free T4: 0.88 ng/dL (ref 0.61–1.12)

## 2019-10-28 SURGERY — LEFT HEART CATH AND CORONARY ANGIOGRAPHY
Anesthesia: LOCAL

## 2019-10-28 MED ORDER — ANGIOPLASTY BOOK
Freq: Once | Status: AC
  Filled 2019-10-28: qty 1

## 2019-10-28 MED ORDER — HEPARIN SODIUM (PORCINE) 1000 UNIT/ML IJ SOLN
INTRAMUSCULAR | Status: AC
  Filled 2019-10-28: qty 1

## 2019-10-28 MED ORDER — NITROGLYCERIN 0.4 MG SL SUBL: 0.4000 mg | SUBLINGUAL_TABLET | SUBLINGUAL | Status: DC | PRN

## 2019-10-28 MED ORDER — HEPARIN SODIUM (PORCINE) 1000 UNIT/ML IJ SOLN
INTRAMUSCULAR | Status: DC | PRN
  Administered 2019-10-28: 3000 [IU] via INTRAVENOUS
  Administered 2019-10-28 (×2): 4500 [IU] via INTRAVENOUS
  Administered 2019-10-28: 2000 [IU] via INTRAVENOUS

## 2019-10-28 MED ORDER — SODIUM CHLORIDE 0.9% FLUSH: 3.0000 mL | INTRAVENOUS | Status: DC | PRN

## 2019-10-28 MED ORDER — FENTANYL CITRATE (PF) 100 MCG/2ML IJ SOLN
INTRAMUSCULAR | Status: AC
  Filled 2019-10-28: qty 2

## 2019-10-28 MED ORDER — HYDROCHLOROTHIAZIDE 25 MG PO TABS
25.0000 mg | ORAL_TABLET | Freq: Every day | ORAL | Status: DC
  Administered 2019-10-29: 25 mg via ORAL
  Filled 2019-10-28: qty 1

## 2019-10-28 MED ORDER — HEPARIN (PORCINE) IN NACL 1000-0.9 UT/500ML-% IV SOLN
INTRAVENOUS | Status: DC | PRN
  Administered 2019-10-28 (×2): 500 mL

## 2019-10-28 MED ORDER — FENTANYL CITRATE (PF) 100 MCG/2ML IJ SOLN
INTRAMUSCULAR | Status: DC | PRN
  Administered 2019-10-28 (×3): 25 ug via INTRAVENOUS

## 2019-10-28 MED ORDER — TICAGRELOR 90 MG PO TABS
ORAL_TABLET | ORAL | Status: AC
  Filled 2019-10-28: qty 2

## 2019-10-28 MED ORDER — SODIUM CHLORIDE 0.9 % IV SOLN: 250.0000 mL | INTRAVENOUS | Status: DC | PRN

## 2019-10-28 MED ORDER — NITROGLYCERIN 1 MG/10 ML FOR IR/CATH LAB
INTRA_ARTERIAL | Status: AC
  Filled 2019-10-28: qty 10

## 2019-10-28 MED ORDER — MIDAZOLAM HCL 2 MG/2ML IJ SOLN
INTRAMUSCULAR | Status: DC | PRN
  Administered 2019-10-28 (×3): 1 mg via INTRAVENOUS

## 2019-10-28 MED ORDER — HEPARIN BOLUS VIA INFUSION
4000.0000 [IU] | Freq: Once | INTRAVENOUS | Status: AC
  Administered 2019-10-28: 4000 [IU] via INTRAVENOUS
  Filled 2019-10-28: qty 4000

## 2019-10-28 MED ORDER — ALPRAZOLAM 0.25 MG PO TABS: 0.2500 mg | ORAL_TABLET | Freq: Two times a day (BID) | ORAL | Status: DC | PRN

## 2019-10-28 MED ORDER — MIDAZOLAM HCL 2 MG/2ML IJ SOLN
INTRAMUSCULAR | Status: AC
  Filled 2019-10-28: qty 2

## 2019-10-28 MED ORDER — VERAPAMIL HCL 2.5 MG/ML IV SOLN
INTRAVENOUS | Status: AC
  Filled 2019-10-28: qty 2

## 2019-10-28 MED ORDER — ALLOPURINOL 100 MG PO TABS
100.0000 mg | ORAL_TABLET | Freq: Two times a day (BID) | ORAL | Status: DC
  Administered 2019-10-28 – 2019-10-29 (×2): 100 mg via ORAL
  Filled 2019-10-28 (×4): qty 1

## 2019-10-28 MED ORDER — TICAGRELOR 90 MG PO TABS
90.0000 mg | ORAL_TABLET | Freq: Two times a day (BID) | ORAL | Status: DC
  Administered 2019-10-29 (×2): 90 mg via ORAL
  Filled 2019-10-28 (×2): qty 1

## 2019-10-28 MED ORDER — HEPARIN (PORCINE) IN NACL 1000-0.9 UT/500ML-% IV SOLN
INTRAVENOUS | Status: AC
  Filled 2019-10-28: qty 1000

## 2019-10-28 MED ORDER — ONDANSETRON HCL 4 MG/2ML IJ SOLN: 4.0000 mg | Freq: Four times a day (QID) | INTRAMUSCULAR | Status: DC | PRN

## 2019-10-28 MED ORDER — ASPIRIN EC 81 MG PO TBEC: 81.0000 mg | DELAYED_RELEASE_TABLET | Freq: Every day | ORAL | Status: DC

## 2019-10-28 MED ORDER — POTASSIUM CHLORIDE CRYS ER 20 MEQ PO TBCR: 40.0000 meq | EXTENDED_RELEASE_TABLET | Freq: Once | ORAL | Status: DC

## 2019-10-28 MED ORDER — LIDOCAINE HCL (PF) 1 % IJ SOLN
INTRAMUSCULAR | Status: AC
  Filled 2019-10-28: qty 30

## 2019-10-28 MED ORDER — AMLODIPINE BESYLATE 5 MG PO TABS
5.0000 mg | ORAL_TABLET | Freq: Every day | ORAL | Status: DC
  Administered 2019-10-28 – 2019-10-29 (×2): 5 mg via ORAL
  Filled 2019-10-28 (×2): qty 1

## 2019-10-28 MED ORDER — ASPIRIN 81 MG PO CHEW: 81.0000 mg | CHEWABLE_TABLET | ORAL | Status: AC

## 2019-10-28 MED ORDER — LIDOCAINE HCL (PF) 1 % IJ SOLN
INTRAMUSCULAR | Status: DC | PRN
  Administered 2019-10-28: 3 mL

## 2019-10-28 MED ORDER — NITROGLYCERIN 2 % TD OINT
1.0000 [in_us] | TOPICAL_OINTMENT | Freq: Once | TRANSDERMAL | Status: DC
  Filled 2019-10-28: qty 1

## 2019-10-28 MED ORDER — SODIUM CHLORIDE 0.9% FLUSH
3.0000 mL | Freq: Two times a day (BID) | INTRAVENOUS | Status: DC
  Administered 2019-10-28: 3 mL via INTRAVENOUS

## 2019-10-28 MED ORDER — METOPROLOL SUCCINATE ER 25 MG PO TB24
50.0000 mg | ORAL_TABLET | Freq: Once | ORAL | Status: AC
  Administered 2019-10-28: 50 mg via ORAL
  Filled 2019-10-28: qty 2

## 2019-10-28 MED ORDER — HEPARIN (PORCINE) 25000 UT/250ML-% IV SOLN
1150.0000 [IU]/h | INTRAVENOUS | Status: DC
  Administered 2019-10-28: 1150 [IU]/h via INTRAVENOUS
  Filled 2019-10-28: qty 250

## 2019-10-28 MED ORDER — ASPIRIN EC 81 MG PO TBEC
81.0000 mg | DELAYED_RELEASE_TABLET | Freq: Once | ORAL | Status: AC
  Administered 2019-10-28: 81 mg via ORAL
  Filled 2019-10-28: qty 1

## 2019-10-28 MED ORDER — VERAPAMIL HCL 2.5 MG/ML IV SOLN
INTRAVENOUS | Status: DC | PRN
  Administered 2019-10-28: 10 mL via INTRA_ARTERIAL

## 2019-10-28 MED ORDER — DARIFENACIN HYDROBROMIDE ER 7.5 MG PO TB24
7.5000 mg | ORAL_TABLET | Freq: Every day | ORAL | Status: DC
  Filled 2019-10-28 (×2): qty 1

## 2019-10-28 MED ORDER — TICAGRELOR 90 MG PO TABS
ORAL_TABLET | ORAL | Status: DC | PRN
  Administered 2019-10-28: 180 mg via ORAL

## 2019-10-28 MED ORDER — IOHEXOL 350 MG/ML SOLN
INTRAVENOUS | Status: DC | PRN
  Administered 2019-10-28: 125 mL

## 2019-10-28 MED ORDER — PANTOPRAZOLE SODIUM 20 MG PO TBEC
20.0000 mg | DELAYED_RELEASE_TABLET | Freq: Once | ORAL | Status: AC
  Administered 2019-10-28: 20 mg via ORAL
  Filled 2019-10-28 (×2): qty 1

## 2019-10-28 MED ORDER — FLAX SEED OIL 1000 MG PO CAPS: 1000.0000 mg | ORAL_CAPSULE | Freq: Two times a day (BID) | ORAL | Status: DC

## 2019-10-28 MED ORDER — ACETAMINOPHEN 325 MG PO TABS: 650.0000 mg | ORAL_TABLET | ORAL | Status: DC | PRN

## 2019-10-28 MED ORDER — COLCHICINE 0.6 MG PO TABS
0.6000 mg | ORAL_TABLET | Freq: Every day | ORAL | Status: DC
  Administered 2019-10-29: 0.6 mg via ORAL
  Filled 2019-10-28: qty 1

## 2019-10-28 MED ORDER — ROSUVASTATIN CALCIUM 20 MG PO TABS
20.0000 mg | ORAL_TABLET | Freq: Every day | ORAL | Status: DC
  Administered 2019-10-28: 20 mg via ORAL
  Filled 2019-10-28: qty 1

## 2019-10-28 MED ORDER — LABETALOL HCL 5 MG/ML IV SOLN: 10.0000 mg | INTRAVENOUS | Status: AC | PRN

## 2019-10-28 MED ORDER — SODIUM CHLORIDE 0.9 % IV SOLN: INTRAVENOUS | Status: DC

## 2019-10-28 MED ORDER — SODIUM CHLORIDE 0.9 % IV SOLN: INTRAVENOUS | Status: AC

## 2019-10-28 MED ORDER — ASPIRIN EC 81 MG PO TBEC
81.0000 mg | DELAYED_RELEASE_TABLET | Freq: Every day | ORAL | Status: DC
  Administered 2019-10-29: 81 mg via ORAL
  Filled 2019-10-28: qty 1

## 2019-10-28 MED ORDER — NITROGLYCERIN IN D5W 200-5 MCG/ML-% IV SOLN
0.0000 ug/min | INTRAVENOUS | Status: DC
  Administered 2019-10-28: 5 ug/min via INTRAVENOUS
  Filled 2019-10-28: qty 250

## 2019-10-28 MED ORDER — HYDROCHLOROTHIAZIDE 25 MG PO TABS
25.0000 mg | ORAL_TABLET | Freq: Once | ORAL | Status: AC
  Administered 2019-10-28: 25 mg via ORAL
  Filled 2019-10-28: qty 1

## 2019-10-28 MED ORDER — PANTOPRAZOLE SODIUM 40 MG PO TBEC
80.0000 mg | DELAYED_RELEASE_TABLET | Freq: Every day | ORAL | Status: DC
  Administered 2019-10-29: 80 mg via ORAL
  Filled 2019-10-28: qty 2

## 2019-10-28 MED ORDER — HYDRALAZINE HCL 20 MG/ML IJ SOLN: 10.0000 mg | INTRAMUSCULAR | Status: AC | PRN

## 2019-10-28 MED ORDER — NITROGLYCERIN IN D5W 200-5 MCG/ML-% IV SOLN: 0.0000 ug/min | INTRAVENOUS | Status: DC

## 2019-10-28 MED ORDER — TICAGRELOR 90 MG PO TABS
ORAL_TABLET | ORAL | Status: AC
  Filled 2019-10-28: qty 1

## 2019-10-28 MED ORDER — RAMIPRIL 10 MG PO CAPS
10.0000 mg | ORAL_CAPSULE | Freq: Two times a day (BID) | ORAL | Status: DC
  Administered 2019-10-28 – 2019-10-29 (×2): 10 mg via ORAL
  Filled 2019-10-28 (×3): qty 1

## 2019-10-28 SURGICAL SUPPLY — 20 items
BALLN SAPPHIRE 2.5X12 (BALLOONS) ×2
BALLN SAPPHIRE ~~LOC~~ 3.5X10 (BALLOONS) ×1 IMPLANT
BALLOON SAPPHIRE 2.5X12 (BALLOONS) IMPLANT
CATH INFINITI 5FR ANG PIGTAIL (CATHETERS) ×1 IMPLANT
CATH LAUNCHER 5F JR4 (CATHETERS) ×1 IMPLANT
CATH OPTITORQUE TIG 4.0 5F (CATHETERS) ×1 IMPLANT
CATH VISTA GUIDE 6FR JR4 (CATHETERS) ×1 IMPLANT
DEVICE RAD COMP TR BAND LRG (VASCULAR PRODUCTS) ×1 IMPLANT
GLIDESHEATH SLEND SS 6F .021 (SHEATH) ×1 IMPLANT
GUIDEWIRE INQWIRE 1.5J.035X260 (WIRE) IMPLANT
INQWIRE 1.5J .035X260CM (WIRE) ×2
KIT ENCORE 26 ADVANTAGE (KITS) ×1 IMPLANT
KIT HEART LEFT (KITS) ×2 IMPLANT
PACK CARDIAC CATHETERIZATION (CUSTOM PROCEDURE TRAY) ×2 IMPLANT
SHEATH PROBE COVER 6X72 (BAG) ×1 IMPLANT
STENT RESOLUTE ONYX 3.0X12 (Permanent Stent) ×1 IMPLANT
TRANSDUCER W/STOPCOCK (MISCELLANEOUS) ×2 IMPLANT
TUBING CIL FLEX 10 FLL-RA (TUBING) ×2 IMPLANT
WIRE ASAHI PROWATER 180CM (WIRE) ×1 IMPLANT
WIRE HI TORQ VERSACORE-J 145CM (WIRE) ×1 IMPLANT

## 2019-10-28 NOTE — ED Provider Notes (Addendum)
[facility] EMERGENCY DEPARTMENT Provider Note   CSN: [id number] Arrival date & time: 10/27/19  2241     History Chief Complaint  Patient presents with  . Chest Pain    Robert Reilly is a 78 y.o. male.  HPI Patient reports he had episode of severe "heartburn" Sunday night.  He reports there was a lot of pressure and fullness in his chest.  He states that it had improved by Monday morning and he did not seek treatment that day.  He felt all right on Monday.  He reports he did eat spaghetti Monday evening and by later in the night he had severe pain that had both burning, fullness and pressure quality through his anterior chest.  Patient reports it did not seem to be improving and he had his wife call 911.  He reports that he was very concerned because, when he had a heart attack about 10 years ago, that was how it felt.  He reports that he took the 4 chewed baby aspirin as recommended by the 911 caller and by the time he was being transported, the pain resolved.  He reports he did not get any nitroglycerin from medics.  He then spent a prolonged amount of time in the ED waiting room.  He reports he was pain-free until about 2 AM, he had about another hours worth of pain that then resolved spontaneously.  He reports he has been pain-free since that time.    Past Medical History:  Diagnosis Date  . Acute myocardial infarction of other inferior wall, subsequent episode of care   . Cancer ([place])    melenoma  back , left arm   4-5 yrs ago  . Coronary atherosclerosis of native coronary artery   . GERD (gastroesophageal reflux disease)   . Gout   . History of hiatal hernia   . Hypercholesteremia   . Hypertension   . Old MI (myocardial infarction) 09/10/2013   2004, stent to RCA  . Osteoarthritis   . Other disorder of calcium metabolism   . Tick bite 05/25/2016   states has had 5 in past month    Patient Active Problem List   Diagnosis Date Noted  . Old MI (myocardial  infarction) 09/10/2013  . Coronary atherosclerosis of native coronary artery 09/10/2013  . RA (rheumatoid arthritis) ([place]) 09/10/2013  . HTN (hypertension) 09/10/2013    Past Surgical History:  Procedure Laterality Date  . CARDIAC CATHETERIZATION    . CORONARY ANGIOPLASTY     stent x 1    12-15 yrs ago  . EYE SURGERY Bilateral    cataracts  . INSERTION OF MESH N/A 05/30/2016   Procedure: INSERTION OF MESH;  Surgeon: Jackolyn Confer, MD;  Location: [facility];  Service: General;  Laterality: N/A;  . MELANOMA EXCISION     back, left arm  . SHOULDER ARTHROSCOPY WITH ROTATOR CUFF REPAIR Left   . UMBILICAL HERNIA REPAIR N/A 05/30/2016   Procedure: UMBILICAL HERNIA REPAIR;  Surgeon: Jackolyn Confer, MD;  Location: [facility];  Service: General;  Laterality: N/A;       Family History  Problem Relation Age of Onset  . Stroke Mother   . Cancer Father     Social History   Tobacco Use  . Smoking status: Former Smoker    Types: Cigarettes    Quit date: 05/25/2001    Years since quitting: 18.4  . Smokeless tobacco: Former Network engineer Use Topics  . Alcohol use: Yes  Comment: 2-3 drinks week- gin  . Drug use: No    Home Medications Prior to Admission medications   Medication Sig Start Date End Date Taking? Authorizing Provider  allopurinol (ZYLOPRIM) 100 MG tablet Take 100 mg by mouth 2 (two) times daily.  02/13/15   [provider]  amLODipine (NORVASC) 5 MG tablet TAKE 1 TABLET BY MOUTH DAILY. 07/22/19   Burtis Junes, NP  aspirin EC 81 MG tablet Take 81 mg by mouth daily.    [provider]  colchicine 0.6 MG tablet Take 0.6 mg by mouth daily.     [provider]  Flaxseed, Linseed, (FLAX SEED OIL) 1000 MG CAPS Take 1,000 mg by mouth 2 (two) times daily.     [provider]  hydrochlorothiazide (HYDRODIURIL) 25 MG tablet TAKE 1 TABLET BY MOUTH DAILY. 01/06/19   Burtis Junes, NP  meloxicam (MOBIC) 15 MG tablet Take 15 mg by mouth daily.  09/06/13    [provider]  metoprolol succinate (TOPROL-XL) 50 MG 24 hr tablet TAKE 1 TABLET BY MOUTH DAILY. 06/23/19   Burtis Junes, NP  nitroGLYCERIN (NITROSTAT) 0.4 MG SL tablet Place 1 tablet (0.4 mg total) under the tongue every 5 (five) minutes as needed for chest pain. 02/13/18   Burtis Junes, NP  omeprazole (PRILOSEC) 20 MG capsule TAKE 1 CAPSULE BY MOUTH DAILY 08/04/19   Jerline Pain, MD  ramipril (ALTACE) 10 MG capsule TAKE 1 CAPSULE BY MOUTH 2 TIMES DAILY. 03/17/19   Burtis Junes, NP  rosuvastatin (CRESTOR) 20 MG tablet TAKE 1 TABLET BY MOUTH AT BEDTIME. 06/23/19   Burtis Junes, NP  solifenacin (VESICARE) 5 MG tablet Take 5 mg by mouth daily.    [provider]    Allergies    Tetracyclines & related and Other  Review of Systems   Review of Systems 10 Systems reviewed and are negative for acute change except as noted in the HPI.  Physical Exam Updated Vital Signs BP (!) 157/91   Pulse 62   Temp 97.9 F (36.6 C) (Oral)   Resp 17   Ht 5\' 8"  (1.727 m)   Wt 88.5 kg   SpO2 100%   BMI 29.65 kg/m   Physical Exam Constitutional:      Appearance: He is well-developed.  HENT:     Head: Normocephalic and atraumatic.  Eyes:     Extraocular Movements: Extraocular movements intact.  Cardiovascular:     Rate and Rhythm: Normal rate and regular rhythm.     Heart sounds: Normal heart sounds.  Pulmonary:     Effort: Pulmonary effort is normal.     Breath sounds: Normal breath sounds.  Abdominal:     General: Bowel sounds are normal. There is no distension.     Palpations: Abdomen is soft.     Tenderness: There is no abdominal tenderness.  Musculoskeletal:        General: No swelling or tenderness. Normal range of motion.     Cervical back: Neck supple.  Skin:    General: Skin is warm and dry.  Neurological:     Mental Status: He is alert and oriented to person, place, and time.     GCS: GCS eye subscore is 4. GCS verbal subscore is 5. GCS motor  subscore is 6.     Coordination: Coordination normal.  Psychiatric:        Mood and Affect: Mood normal.     ED Results /  Procedures / Treatments   Labs (all labs ordered are listed, but only abnormal results are displayed) Labs Reviewed  BASIC METABOLIC PANEL - Abnormal; Notable for the following components:      Result Value   Potassium 3.4 (*)    All other components within normal limits  CBC  LIPASE, BLOOD  HEPATIC FUNCTION PANEL  TROPONIN I (HIGH SENSITIVITY)  TROPONIN I (HIGH SENSITIVITY)    EKG EKG Interpretation  Date/Time:  Monday October 27 2019 22:44:49 EST Ventricular Rate:  66 PR Interval:  184 QRS Duration: 92 QT Interval:  412 QTC Calculation: 431 R Axis:   24 Text Interpretation: Normal sinus rhythm Possible Anterior infarct , age undetermined Abnormal ECG No significant change since last tracing Confirmed by Merrily Pew [phone]) on 10/27/2019 11:24:58 PM   Radiology DG Chest 2 View  Result Date: 10/27/2019 CLINICAL DATA:  Initial evaluation for acute chest pain. EXAM: CHEST - 2 VIEW COMPARISON:  Prior radiograph from 10/10/2017. FINDINGS: Mild cardiomegaly. Mediastinal silhouette within normal limits. Mild tortuosity the intrathoracic aorta noted. Aortic atherosclerosis. Lungs mildly hypoinflated. Mild streaky bibasilar subsegmental atelectasis. No focal infiltrates. No edema or effusion. No pneumothorax. No acute osseous finding. IMPRESSION: 1. No active cardiopulmonary disease. 2. Mild cardiomegaly without edema. 3.  Aortic Atherosclerosis (ICD10-I70.0). Electronically Signed   By: Jeannine Boga M.D.   On: 10/27/2019 23:11    Procedures Procedures (including critical care time) CRITICAL CARE Performed by: Charlesetta Shanks   Total critical care time: 20 minutes  Critical care time was exclusive of separately billable procedures and treating other patients.  Critical care was necessary to treat or prevent imminent or life-threatening  deterioration.  Critical care was time spent personally by me on the following activities: development of treatment plan with patient and/or surrogate as well as nursing, discussions with consultants, evaluation of patient's response to treatment, examination of patient, obtaining history from patient or surrogate, ordering and performing treatments and interventions, ordering and review of laboratory studies, ordering and review of radiographic studies, pulse oximetry and re-evaluation of patient's condition. Medications Ordered in ED Medications - No data to display  ED Course  I have reviewed the triage vital signs and the nursing notes.  Pertinent labs & imaging results that were available during my care of the patient were reviewed by me and considered in my medical decision making (see chart for details).    MDM Rules/Calculators/A&P                      Patient presents with 3 episodes of chest pain in the past 48 hours.  He does have high risk with known coronary artery disease and a stent placed approximately 10 years ago.  Patient is pain-free at the time of my assessment.  Will administer a.m. blood pressure medications, nitroglycerin paste and his low-dose a.m. aspirin.  Plan to consult cardiology.  Patient has been seen by cardiology.  They request addition of rapid PCR Covid testing for anticipated cardiac catheterization. Final Clinical Impression(s) / ED Diagnoses Final diagnoses:  Unstable angina ([place])  Substernal precordial chest pain    Rx / DC Orders ED Discharge Orders    None       Charlesetta Shanks, MD 10/28/19 [id number]    Charlesetta Shanks, MD 10/28/19 1049

## 2019-10-28 NOTE — ED Notes (Signed)
Pt called x3 for vitals. No reply. 

## 2019-10-28 NOTE — Progress Notes (Signed)
Troponin = 30. Post intervention.

## 2019-10-28 NOTE — Plan of Care (Signed)
  Problem: Education: Goal: Knowledge of General Education information will improve Description: Including pain rating scale, medication(s)/side effects and non-pharmacologic comfort measures Outcome: Progressing   Problem: Clinical Measurements: Goal: Ability to maintain clinical measurements within normal limits will improve Outcome: Progressing Goal: Cardiovascular complication will be avoided Outcome: Progressing   

## 2019-10-28 NOTE — Progress Notes (Signed)
Echocardiogram 2D Echocardiogram has been performed.  Matilde Bash 10/28/2019, 1:34 PM

## 2019-10-28 NOTE — Progress Notes (Signed)
TR BAND REMOVAL  LOCATION:    right radial  DEFLATED PER PROTOCOL:    Yes.    TIME BAND OFF / DRESSING APPLIED:    2110   SITE UPON ARRIVAL:    Level 0  SITE AFTER BAND REMOVAL:    Level 1  CIRCULATION SENSATION AND MOVEMENT:    Within Normal Limits   Yes.    COMMENTS:   Pt developed small hematoma, manual pressure applied, hematoma resolved (bruised and soft). Post TR band instructions given, applied sterile dressing, good capillary refill.

## 2019-10-28 NOTE — Progress Notes (Signed)
[place] [facility] for heparin Indication: chest pain/ACS  Heparin Dosing Weight: 86.4 kg  Labs: Recent Labs    10/27/19 2301 10/28/19 0058  HGB 15.1  --   HCT 45.3  --   PLT 211  --   CREATININE 1.16  --   TROPONINIHS 7 7    Estimated Creatinine Clearance: 57.6 mL/min (by C-G formula based on SCr of 1.16 mg/dL).  Assessment: 68 yom presenting with CP, elevated high-sensitivity troponin. Pharmacy consulted to dose heparin. Patient is not on anticoagulation PTA. CBC wnl. No active bleed issues documented  Goal of Therapy:  Heparin level 0.3-0.7 units/ml Monitor platelets by anticoagulation protocol: Yes   Plan:  Heparin 4000 unit bolus Start heparin at 1150 units/h 6h heparin level Daily heparin level/CBC Monitor s/sx bleeding   Elicia Lamp, PharmD, BCPS Please check AMION for all [facility] contact numbers Clinical Pharmacist 10/28/2019 11:37 AM

## 2019-10-28 NOTE — H&P (Addendum)
Cardiology Consultation:   Patient ID: Robert Reilly MRN: [id number]; DOB: [birth year]  Admit date: 10/27/2019 Date of Consult: 10/28/2019  Primary Care Provider: Alroy Dust, L.Marlou Sa, MD Primary Cardiologist: Candee Furbish, MD  Primary Electrophysiologist:  None   Chief Complaint:  Chest pain   Patient Profile:   Robert Reilly is a 78 y.o. male with a hx of CAD and MI in 2004, stent to RCA, HTN, HLD, RA   who is being seen today for the evaluation of chest pain at the request of Dr Johnney Killian.  History of Present Illness:   Robert Reilly has a hx of CAD with MI in 2004, with stent to RCA.  Had been doing well except for BP changes with wt loss and gain.  Last seen in office 10/14/19 and walked  Up 3 flights of steps without problems.    Yesterday around 2300 presented to ER by EMS with chest pain from 5 pm yesterday.   BP was 175/93, with PVCs and SR   He noted severe heartburn on Sunday night along with fullness in chest It haad improved by Monday so he did not call office.  During the night he has some reflux symptoms.  Then with eating spaghetti Monday- though he tells me it occurred Monday after noon, he developed severe chest pain, burning and fullness along with pressure.  He took 4 baby ASA and by time he arrived to ER pain resolved but reminded him of his MI in 2004.  At 0200 he had another bout of angina resolved on own.  Currently with elevated BP and chest fullness.    EKG:  The EKG was personally reviewed and demonstrates:  SR with no EKG changes - old inf MI. With flipped T waves Telemetry:  Telemetry was personally reviewed and demonstrates:  SR   Na 144, k+ 3.4, BUN 19, Cr 1.16 LFTs WNL  Troponin 7  X 2  WBC 8, Hgb 15, HCT 45 plts 211   2V CXR IMPRESSION: 1. No active cardiopulmonary disease. 2. Mild cardiomegaly without edema. 3.  Aortic Atherosclerosis (ICD10-I70.0).  BP 158/89   He reminds me that his mother had a stroke with cardiac cath.  He has had toprol, NTG paste with  continued fullness, HCTZ  And protonix. Along with ASA 81 today.    Heart Pathway Score:     Past Medical History:  Diagnosis Date  . Acute myocardial infarction of other inferior wall, subsequent episode of care   . Cancer ([place])    melenoma  back , left arm   4-5 yrs ago  . Coronary atherosclerosis of native coronary artery   . GERD (gastroesophageal reflux disease)   . Gout   . History of hiatal hernia   . Hypercholesteremia   . Hypertension   . Old MI (myocardial infarction) 09/10/2013   2004, stent to RCA  . Osteoarthritis   . Other disorder of calcium metabolism   . Tick bite 05/25/2016   states has had 5 in past month    Past Surgical History:  Procedure Laterality Date  . CARDIAC CATHETERIZATION    . CORONARY ANGIOPLASTY     stent x 1    12-15 yrs ago  . EYE SURGERY Bilateral    cataracts  . INSERTION OF MESH N/A 05/30/2016   Procedure: INSERTION OF MESH;  Surgeon: Jackolyn Confer, MD;  Location: [facility];  Service: General;  Laterality: N/A;  . MELANOMA EXCISION     back, left arm  .  SHOULDER ARTHROSCOPY WITH ROTATOR CUFF REPAIR Left   . UMBILICAL HERNIA REPAIR N/A 05/30/2016   Procedure: UMBILICAL HERNIA REPAIR;  Surgeon: Jackolyn Confer, MD;  Location: [facility];  Service: General;  Laterality: N/A;     Home Medications:  Prior to Admission medications   Medication Sig Start Date End Date Taking? Authorizing Provider  allopurinol (ZYLOPRIM) 100 MG tablet Take 100 mg by mouth 2 (two) times daily.  02/13/15  Yes [provider]  amLODipine (NORVASC) 5 MG tablet TAKE 1 TABLET BY MOUTH DAILY. Patient taking differently: Take 5 mg by mouth at bedtime.  07/22/19  Yes Burtis Junes, NP  aspirin EC 81 MG tablet Take 81 mg by mouth daily.   Yes [provider]  colchicine 0.6 MG tablet Take 0.6 mg by mouth daily.    Yes [provider]  Flaxseed, Linseed, (FLAX SEED OIL) 1000 MG CAPS Take 1,000 mg by mouth 2 (two) times daily.    Yes [provider]  hydrochlorothiazide (HYDRODIURIL) 25 MG tablet TAKE 1 TABLET BY MOUTH DAILY. Patient taking differently: Take 25 mg by mouth daily.  01/06/19  Yes Burtis Junes, NP  meloxicam (MOBIC) 15 MG tablet Take 15 mg by mouth daily.  09/06/13  Yes [provider]  metoprolol succinate (TOPROL-XL) 50 MG 24 hr tablet TAKE 1 TABLET BY MOUTH DAILY. Patient taking differently: Take 50 mg by mouth daily.  06/23/19  Yes Burtis Junes, NP  omeprazole (PRILOSEC) 20 MG capsule TAKE 1 CAPSULE BY MOUTH DAILY 08/04/19  Yes Jerline Pain, MD  ramipril (ALTACE) 10 MG capsule TAKE 1 CAPSULE BY MOUTH 2 TIMES DAILY. Patient taking differently: Take 10 mg by mouth 2 (two) times daily. TAKE 1 CAPSULE BY MOUTH 2 TIMES DAILY. 03/17/19  Yes Burtis Junes, NP  rosuvastatin (CRESTOR) 20 MG tablet TAKE 1 TABLET BY MOUTH AT BEDTIME. Patient taking differently: Take 20 mg by mouth at bedtime.  06/23/19  Yes Burtis Junes, NP  solifenacin (VESICARE) 5 MG tablet Take 5 mg by mouth daily.   Yes [provider]  nitroGLYCERIN (NITROSTAT) 0.4 MG SL tablet Place 1 tablet (0.4 mg total) under the tongue every 5 (five) minutes as needed for chest pain. 02/13/18   Burtis Junes, NP    Inpatient Medications: Scheduled Meds:  Continuous Infusions: . nitroGLYCERIN     PRN Meds:   Allergies:    Allergies  Allergen Reactions  . Tetracyclines & Related Rash  . Other Other (See Comments)    No meat: Came from a tick bite     Social History:   Social History   Socioeconomic History  . Marital status: Married    Spouse name: Not on file  . Number of children: Not on file  . Years of education: Not on file  . Highest education level: Not on file  Occupational History  . Not on file  Tobacco Use  . Smoking status: Former Smoker    Types: Cigarettes    Quit date: 05/25/2001    Years since quitting: 18.4  . Smokeless tobacco: Former Network engineer and Sexual Activity  . Alcohol use:  Yes    Comment: 2-3 drinks week- gin  . Drug use: No  . Sexual activity: Not on file  Other Topics Concern  . Not on file  Social History Narrative  . Not on file   Social Determinants of Health   Financial Resource Strain:   . Difficulty of  Paying Living Expenses: Not on file  Food Insecurity:   . Worried About Charity fundraiser in the Last Year: Not on file  . Ran Out of Food in the Last Year: Not on file  Transportation Needs:   . Lack of Transportation (Medical): Not on file  . Lack of Transportation (Non-Medical): Not on file  Physical Activity:   . Days of Exercise per Week: Not on file  . Minutes of Exercise per Session: Not on file  Stress:   . Feeling of Stress : Not on file  Social Connections:   . Frequency of Communication with Friends and Family: Not on file  . Frequency of Social Gatherings with Friends and Family: Not on file  . Attends Religious Services: Not on file  . Active Member of Clubs or Organizations: Not on file  . Attends Archivist Meetings: Not on file  . Marital Status: Not on file  Intimate Partner Violence:   . Fear of Current or Ex-Partner: Not on file  . Emotionally Abused: Not on file  . Physically Abused: Not on file  . Sexually Abused: Not on file    Family History:    Family History  Problem Relation Age of Onset  . Stroke Mother   . Cancer Father      ROS:  Please see the history of present illness.  General:no colds or fevers, no weight changes Skin:no rashes or ulcers HEENT:no blurred vision, no congestion CV:see HPI PUL:see HPI GI:no diarrhea constipation or melena, no indigestion GU:no hematuria, no dysuria MS:no joint pain, no claudication, + episodic gout  Neuro:no syncope, no lightheadedness Endo:no diabetes, no thyroid disease  All other ROS reviewed and negative.     Physical Exam/Data:   Vitals:   10/28/19 0930 10/28/19 0945 10/28/19 1052 10/28/19 1100  BP: (!) 166/88 (!) 158/89 (!) 152/84 (!)  155/87  Pulse: 61 (!) 58  (!) 58  Resp: 11 15 16 13   Temp:      TempSrc:      SpO2: 100% 94%  95%  Weight:      Height:       No intake or output data in the 24 hours ending 10/28/19 1125 Last 3 Weights 10/27/2019 10/14/2019 04/14/2019  Weight (lbs) 195 lb 196 lb 12.8 oz 191 lb 6.4 oz  Weight (kg) 88.451 kg 89.268 kg 86.818 kg     Body mass index is 29.65 kg/m.  General:  Well nourished, well developed, in no acute distress HEENT: normal Lymph: no adenopathy Neck: no JVD Endocrine:  No thryomegaly Vascular: No carotid bruits; FA pulses 2+ bilaterally without bruits  Cardiac: (Distant heart sounds) normal S1, S2; RRR; no murmur gallup rub or click Lungs:  clear to auscultation bilaterally, no wheezing, rhonchi or rales  Abd: soft, nontender, no hepatomegaly  Ext: no clubbing/cyanosis or edema Musculoskeletal:  No deformities, BUE and BLE strength normal and equal Skin: warm and dry; somewhat ruddy complexion  Neuro:  Alert and oriented X 3 MAE follows commands, no focal abnormalities noted Psych:  Normal mood and affect    Relevant CV Studies: Stent to RCA in 2004  Laboratory Data:  High Sensitivity Troponin:   Recent Labs  Lab 10/27/19 2301 10/28/19 0058  TROPONINIHS 7 7     Chemistry Recent Labs  Lab 10/27/19 2301  NA 144  K 3.4*  CL 107  CO2 28  GLUCOSE 82  BUN 19  CREATININE 1.16  CALCIUM 9.5  GFRNONAA >60  GFRAA >60  ANIONGAP 9    Recent Labs  Lab 10/28/19 0902  PROT 7.1  ALBUMIN 3.8  AST 25  ALT 25  ALKPHOS 42  BILITOT 1.0   Hematology Recent Labs  Lab 10/27/19 2301  WBC 8.0  RBC 4.57  HGB 15.1  HCT 45.3  MCV 99.1  MCH 33.0  MCHC 33.3  RDW 13.8  PLT 211   BNPNo results for input(s): BNP, PROBNP in the last 168 hours.  DDimer No results for input(s): DDIMER in the last 168 hours.   Radiology/Studies:  DG Chest 2 View  Result Date: 10/27/2019 CLINICAL DATA:  Initial evaluation for acute chest pain. EXAM: CHEST - 2 VIEW  COMPARISON:  Prior radiograph from 10/10/2017. FINDINGS: Mild cardiomegaly. Mediastinal silhouette within normal limits. Mild tortuosity the intrathoracic aorta noted. Aortic atherosclerosis. Lungs mildly hypoinflated. Mild streaky bibasilar subsegmental atelectasis. No focal infiltrates. No edema or effusion. No pneumothorax. No acute osseous finding. IMPRESSION: 1. No active cardiopulmonary disease. 2. Mild cardiomegaly without edema. 3.  Aortic Atherosclerosis (ICD10-I70.0). Electronically Signed   By: Jeannine Boga M.D.   On: 10/27/2019 23:11       TIMI Risk Score for Unstable Angina or Non-ST Elevation MI:   The patient's TIMI risk score is 5, which indicates a 26% risk of all cause mortality, new or recurrent myocardial infarction or need for urgent revascularization in the next 14 days.   Assessment and Plan:   1. Unstable angina though no EKG changes or elevated troponin.  Hx of stent to RCA and similar symptoms at that time.  May be GERD but would be important to eval for coronary stenosis before a GI work up. Plan for cardiac cath and check stat COVID.  Timi risk score is 5.  Will begin IV NTG and IV heparin, repeat EKG and troponin hs and have discussed cardiac cath but will defer to Dr. Ellyn Hack.   Pt's mother had CVA with cardiac cath.   2. CAD with prior stent to RCA in 2004 3. HTN - elevated now continue meds  Will add NTG to help with BP and continued chest fullness. 4. HLD on statin and recent LDL 63, HDL 37 10/14/19 continue statin. 5. OA/gout on chronic Mobic and colchicine  The patient understands that risks included but are not limited to stroke (1 in 1000), death (1 in 1000), kidney failure [usually temporary] (1 in 500), bleeding (1 in 200), allergic reaction [possibly serious] (1 in 200).       The appropriate patient status for this patient is INPATIENT. Inpatient status is judged to be reasonable and necessary in order to provide the required intensity of service to  ensure the patient's safety. The patient's presenting symptoms, physical exam findings, and initial radiographic and laboratory data in the context of their chronic comorbidities is felt to place them at high risk for further clinical deterioration. Furthermore, it is not anticipated that the patient will be medically stable for discharge from the hospital within 2 midnights of admission. The following factors support the patient status of inpatient.   " The patient's presenting symptoms include chest pain . " The worrisome physical exam findings include continued pains. " The initial radiographic and laboratory data are worrisome because of stable. " The chronic co-morbidities include CAD, HTN, HLD  .   * I certify that at the point of admission it is my clinical judgment that the patient will require inpatient hospital care spanning beyond 2 midnights from the point of  admission due to high intensity of service, high risk for further deterioration and high frequency of surveillance required.*   For questions or updates, please contact [place] Please consult [email or web address] for contact info under     Signed, Cecilie Kicks, NP  10/28/2019 11:25 AM    ATTENDING ATTESTATION  I have seen, examined and evaluated the patient this AM along with Cecilie Kicks, NP .  After reviewing all the available data and chart, we discussed the patients laboratory, study & physical findings as well as symptoms in detail. I agree with her findings, examination as well as impression recommendations as per our discussion.    Attending adjustments noted in italics.   Difficult situation, he presented basically with hypertensive urgency and chest discomfort.  He basely has this indigestion sensation that essentially is very similar to his prior anginal equivalent.  He has tried his usual dyspepsia medications and there have not been any help.  He felt better after taking nitroglycerin and aspirin in the EMS  truck.  I do agree this sounds somewhat atypical, however consistent with unstable angina with his prior history.  This could be GI related or potentiated hypertension, but I think the best course of action is to proceed with cardiac catheterization.  I discussed the procedure with risks benefits alternatives and indication the patient in detail.  He agrees to proceed.  For now he is on IV heparin and nitroglycerin for additional blood pressure control.  We can titrate his medications based on findings.  Cath Lab Visit (complete for each Cath Lab visit)  Clinical Evaluation Leading to the Procedure:   ACS: Yes.    Non-ACS:    Anginal Classification: CCS IV  Anti-ischemic medical therapy: Minimal Therapy (1 class of medications)  Non-Invasive Test Results: No non-invasive testing performed  Prior CABG: No previous CABG   [name], M.D., M.S. Interventional Cardiologist   Pager # [phone] Phone # [phone] [address] [place], [place] [address]

## 2019-10-29 ENCOUNTER — Telehealth: Payer: Self-pay | Admitting: Cardiology

## 2019-10-29 DIAGNOSIS — E785 Hyperlipidemia, unspecified: Secondary | ICD-10-CM

## 2019-10-29 DIAGNOSIS — Z955 Presence of coronary angioplasty implant and graft: Secondary | ICD-10-CM

## 2019-10-29 DIAGNOSIS — E782 Mixed hyperlipidemia: Secondary | ICD-10-CM

## 2019-10-29 DIAGNOSIS — I10 Essential (primary) hypertension: Secondary | ICD-10-CM

## 2019-10-29 LAB — CBC
HCT: 44.7 % (ref 39.0–52.0)
Hemoglobin: 15.2 g/dL (ref 13.0–17.0)
MCH: 32.9 pg (ref 26.0–34.0)
MCHC: 34 g/dL (ref 30.0–36.0)
MCV: 96.8 fL (ref 80.0–100.0)
Platelets: 205 10*3/uL (ref 150–400)
RBC: 4.62 MIL/uL (ref 4.22–5.81)
RDW: 13.8 % (ref 11.5–15.5)
WBC: 9.2 10*3/uL (ref 4.0–10.5)
nRBC: 0 % (ref 0.0–0.2)

## 2019-10-29 LAB — BASIC METABOLIC PANEL
Anion gap: 10 (ref 5–15)
BUN: 15 mg/dL (ref 8–23)
CO2: 26 mmol/L (ref 22–32)
Calcium: 9 mg/dL (ref 8.9–10.3)
Chloride: 103 mmol/L (ref 98–111)
Creatinine, Ser: 0.99 mg/dL (ref 0.61–1.24)
GFR calc Af Amer: >60 mL/min (ref >60)
GFR calc non Af Amer: >60 mL/min (ref >60)
Glucose, Bld: 102 mg/dL — ABNORMAL HIGH (ref 70–99)
Potassium: 3.3 mmol/L — ABNORMAL LOW (ref 3.5–5.1)
Sodium: 139 mmol/L (ref 135–145)

## 2019-10-29 MED ORDER — TICAGRELOR 90 MG PO TABS: 90.0000 mg | ORAL_TABLET | Freq: Two times a day (BID) | ORAL | 2 refills | Status: DC

## 2019-10-29 MED ORDER — METOPROLOL SUCCINATE ER 50 MG PO TB24
50.0000 mg | ORAL_TABLET | Freq: Every day | ORAL | Status: DC
  Administered 2019-10-29: 50 mg via ORAL
  Filled 2019-10-29: qty 1

## 2019-10-29 MED FILL — BRILINTA 90 MG TABLET: 90 | 30 days supply | Qty: 60 | Fill #0

## 2019-10-29 MED FILL — Nitroglycerin IV Soln 100 MCG/ML in D5W: INTRA_ARTERIAL | Qty: 10 | Status: AC

## 2019-10-29 NOTE — Progress Notes (Signed)
CARDIAC REHAB PHASE I   PRE:  Rate/Rhythm: 65 SR  BP:  Supine: 138/83  Sitting:   Standing:    SaO2: 97%RA  MODE:  Ambulation: 750 ft   POST:  Rate/Rhythm: 78 SR PVCs  BP:  Supine:   Sitting: 146/84  Standing:    SaO2: 100%RA 0820-0945 Pt walked 750 ft on RA with steady gait and no CP. Tolerated well. Education completed with pt who voiced understanding. Questions answered by myself or NP. Stressed importance of brilinta with stent. Reviewed NTG use, gave heart healthy diet, and discussed walking for ex. Ex guidelines given. Pt stated he has attended [place] [facility] before. Referred to [facility] program again and will let them followup. \Pt is interested in participating in Virtual Cardiac and Pulmonary Rehab. Pt advised that Virtual Cardiac and Pulmonary Rehab is provided at no cost to the patient.  Checklist:  1. Pt has smart device  ie smartphone and/or ipad for downloading an app  Yes 2. Reliable internet/wifi service    Yes 3. Understands how to use their smartphone and navigate within an app.  Yes   Pt verbalized understanding and is in agreement.    Graylon Good, RN BSN  10/29/2019 9:39 AM

## 2019-10-29 NOTE — Discharge Instructions (Signed)

## 2019-10-29 NOTE — Telephone Encounter (Signed)
**Note De-identified  Obfuscation** The pt is being discharged from the hospital today. We will call him tomorrow 

## 2019-10-29 NOTE — Telephone Encounter (Signed)
Patient has a TOC appointment 11/12/2019 with Kathyrn Drown.

## 2019-10-29 NOTE — Discharge Summary (Signed)
Discharge Summary    Patient ID: Robert Reilly,  MRN: [id number], DOB/AGE: [id number] y.o.  Admit date: 10/27/2019 Discharge date: 10/29/2019  Primary Care Provider: Alroy Dust, L.Dean Primary Cardiologist: Candee Furbish, MD  Discharge Diagnoses    Active Problems:   Coronary artery disease involving native coronary artery of native heart with unstable angina pectoris (HCC)   RA (rheumatoid arthritis) (HCC)   HTN (hypertension)   Unstable angina (HCC)   Hyperlipidemia   Allergies Allergies  Allergen Reactions  . Tetracyclines & Related Rash  . Other Other (See Comments)    No meat: Came from a tick bite     Diagnostic Studies/Procedures    Cath: [id number]   LV end diastolic pressure is mildly elevated.  The left ventricular systolic function is normal.  There is no aortic valve stenosis.  CULPRIT LESION: Mid RCA lesion is 90% stenosed.  A drug-eluting stent was successfully placed using a STENT RESOLUTE ONYX 3.0X12. - post-dilated to 3.35 mm  Post intervention, there is a 0% residual stenosis.  Previously placed Mid RCA to Dist RCA stent (DES) is widely patent.  Separate [place] for LAD & LCx   SUMMARY  Severe Single Vessel CAD - mRCA 90% focal lesion upstream from m-dRCA stent  Successful DES PCI of mRCA (Resolute Onyx DES 3.0 x 12 -> post-dilated to 3.35 mm)  No significant disease in LAD & LCx systems - each wtih separate ostia.  Likely preserved LVEF (inadequate LV Gram to assess wall motion) with mildly elevated LVEDP   RECOMMENDATIONS  Overnight admission post PCI  TR Band removal per protocol  Continue to titrate IV NTG overnight for BP control  ASA/Brilinta DAPT x min 6 months uninterrupted (ok to interrupt after 6 months)  Ensure statin & Beta Blocker (pending HR monitoring overnight)   [name], MD, MS  Diagnostic Dominance: Right  Intervention   TTE: 10/28/19  IMPRESSIONS    1. Left ventricular ejection  fraction, by visual estimation, is 55 to 60%. The left ventricle has normal function. Left ventricular septal wall thickness was mildly increased. Mildly increased left ventricular posterior wall thickness. There is mildly  increased left ventricular hypertrophy.  2. Left ventricular diastolic parameters are consistent with Grade I diastolic dysfunction (impaired relaxation).  3. The left ventricle has no regional wall motion abnormalities.  4. Global right ventricle has normal systolic function.The right ventricular size is normal. No increase in right ventricular wall thickness.  5. Left atrial size was normal.  6. Right atrial size was normal.  7. The mitral valve is normal in structure. Trivial mitral valve regurgitation. No evidence of mitral stenosis.  8. The tricuspid valve is normal in structure.  9. The aortic valve is tricuspid. Aortic valve regurgitation is not visualized. No evidence of aortic valve sclerosis or stenosis. 10. The pulmonic valve was normal in structure. Pulmonic valve regurgitation is not visualized. 11. TR signal is inadequate for assessing pulmonary artery systolic pressure. 12. The inferior vena cava is normal in size with <50% respiratory variability, suggesting right atrial pressure of 8 mmHg.  _____________   History of Present Illness     Robert Reilly is a 78 yo has a hx of CAD with MI in 2004, with stent to RCA.  Had been doing well except for BP changes with wt loss and gain.  Last seen in office 10/14/19 and walked up 3 flights of steps without problems.  The day prior to admission around 2300 presented to ER  by EMS with chest pain from 5 pm yesterday. BP was 175/93, with PVCs and SR   He noted severe heartburn on Sunday night along with fullness in chest. It had improved by Monday so he did not call office.  During the night he has some reflux symptoms.  Then with eating spaghetti Monday he developed severe chest pain, burning and fullness along with pressure.   He took 4 baby ASA and by time he arrived to ER pain resolved but reminded him of his MI in 2004.  At 0200 he had another bout of angina resolved on own.   EKG:  The EKG was personally reviewed and demonstrates:  SR with no EKG changes - old inf MI. With flipped T waves Telemetry:  Telemetry was personally reviewed and demonstrates:  SR   Na 144, k+ 3.4, BUN 19, Cr 1.16 LFTs WNL  Troponin 7  X 2  WBC 8, Hgb 15, HCT 45 plts 211   2V CXR IMPRESSION: 1. No active cardiopulmonary disease. 2. Mild cardiomegaly without edema. 3. Aortic Atherosclerosis (ICD10-I70.0).  Given his symptoms, he was admitted with plans to undergo cardiac cath.   Hospital Course     Underwent cardiac cath noted above with severe single vessel CAD of the mRCA with focal lesion above prior RCA stent. Successful PCI/DES x1. Plan for DAPT with ASA/Brilinta for at least 6 months,but likely one year total. No complications noted overnight post cath. Worked well with cardiac rehab the following morning. Follow up echo showed normal EF with G1DD with no rWMA. His home medications were continued the same. Asked that he stop his mobic for the time being given the need for DAPT which he was agreeable to do. Will follow up with his rheumatologist if additional medication is needed.   General: Well developed, well nourished, male appearing in no acute distress. Head: Normocephalic, atraumatic.  Neck: Supple without bruits, JVD. Lungs:  Resp regular and unlabored, CTA. Heart: RRR, S1, S2, no S3, S4, or murmur; no rub. Abdomen: Soft, non-tender, non-distended with normoactive bowel sounds. No hepatomegaly. No rebound/guarding. No obvious abdominal masses. Extremities: No clubbing, cyanosis, edema. Distal pedal pulses are 2+ bilaterally. Right radial cath site stable without bruising or hematoma Neuro: Alert and oriented X 3. Moves all extremities spontaneously. Psych: Normal affect.  Eli Hose Bergren was seen by Dr. Ellyn Hack and  determined stable for discharge home. Follow up in the office has been arranged. Medications are listed below.   _____________  Discharge Vitals Blood pressure (!) 144/90, pulse 66, temperature 97.8 F (36.6 C), temperature source Oral, resp. rate 20, height 5\' 8"  (1.727 m), weight 86 kg, SpO2 98 %.  Filed Weights   10/27/19 2245 10/29/19 0500  Weight: 88.5 kg 86 kg    Labs & Radiologic Studies    CBC Recent Labs    10/27/19 2301 10/29/19 0417  WBC 8.0 9.2  HGB 15.1 15.2  HCT 45.3 44.7  MCV 99.1 96.8  PLT 211 [id number]   Basic Metabolic Panel Recent Labs    10/27/19 2301 10/28/19 1540 10/29/19 0417  NA 144  --  139  K 3.4*  --  3.3*  CL 107  --  103  CO2 28  --  26  GLUCOSE 82  --  102*  BUN 19  --  15  CREATININE 1.16  --  0.99  CALCIUM 9.5  --  9.0  MG  --  1.6*  --    Liver Function Tests Recent Labs  10/28/19 0902  AST 25  ALT 25  ALKPHOS 42  BILITOT 1.0  PROT 7.1  ALBUMIN 3.8   Recent Labs    10/28/19 0902  LIPASE 33   Cardiac Enzymes No results for input(s): CKTOTAL, CKMB, CKMBINDEX, TROPONINI in the last 72 hours. BNP Invalid input(s): POCBNP D-Dimer No results for input(s): DDIMER in the last 72 hours. Hemoglobin A1C Recent Labs    10/28/19 1540  HGBA1C 5.9*   Fasting Lipid Panel No results for input(s): CHOL, HDL, LDLCALC, TRIG, CHOLHDL, LDLDIRECT in the last 72 hours. Thyroid Function Tests Recent Labs    10/28/19 1540  TSH 4.601*   _____________  DG Chest 2 View  Result Date: 10/27/2019 CLINICAL DATA:  Initial evaluation for acute chest pain. EXAM: CHEST - 2 VIEW COMPARISON:  Prior radiograph from 10/10/2017. FINDINGS: Mild cardiomegaly. Mediastinal silhouette within normal limits. Mild tortuosity the intrathoracic aorta noted. Aortic atherosclerosis. Lungs mildly hypoinflated. Mild streaky bibasilar subsegmental atelectasis. No focal infiltrates. No edema or effusion. No pneumothorax. No acute osseous finding. IMPRESSION: 1. No  active cardiopulmonary disease. 2. Mild cardiomegaly without edema. 3.  Aortic Atherosclerosis (ICD10-I70.0). Electronically Signed   By: Jeannine Boga M.D.   On: 10/27/2019 23:11   CARDIAC CATHETERIZATION  Result Date: [id number]  LV end diastolic pressure is mildly elevated.  The left ventricular systolic function is normal.  There is no aortic valve stenosis.  CULPRIT LESION: Mid RCA lesion is 90% stenosed.  A drug-eluting stent was successfully placed using a STENT RESOLUTE ONYX 3.0X12. - post-dilated to 3.35 mm  Post intervention, there is a 0% residual stenosis.  Previously placed Mid RCA to Dist RCA stent (DES) is widely patent.  Separate [place] for LAD & LCx  SUMMARY  Severe Single Vessel CAD - mRCA 90% focal lesion upstream from m-dRCA stent  Successful DES PCI of mRCA (Resolute Onyx DES 3.0 x 12 -> post-dilated to 3.35 mm)  No significant disease in LAD & LCx systems - each wtih separate ostia.  Likely preserved LVEF (inadequate LV Gram to assess wall motion) with mildly elevated LVEDP RECOMMENDATIONS  Overnight admission post PCI  TR Band removal per protocol  Continue to titrate IV NTG overnight for BP control  ASA/Brilinta DAPT x min 6 months uninterrupted (ok to interrupt after 6 months)  Ensure statin & Beta Blocker (pending HR monitoring overnight) [name], MD, MS  ECHOCARDIOGRAM COMPLETE  Result Date: 10/28/2019   ECHOCARDIOGRAM REPORT   Patient Name:   Robert Reilly Date of Exam: 10/28/2019 Medical Rec #:  [id number]    Height:       68.0 in Accession #:    [id number]   Weight:       195.0 lb Date of Birth:  1941-03-10     BSA:          2.02 m Patient Age:    89 years     BP:           155/87 mmHg Patient Gender: M            HR:           58 bpm. Exam Location:  Inpatient Procedure: 2D Echo, Cardiac Doppler and Color Doppler Indications:    Chest pain  History:        Patient has no prior history of Echocardiogram examinations.                 Previous  Myocardial Infarction and CAD; Risk  Factors:Hypertension and Dyslipidemia.  Sonographer:    Dustin Flock Referring Phys: [place]  1. Left ventricular ejection fraction, by visual estimation, is 55 to 60%. The left ventricle has normal function. Left ventricular septal wall thickness was mildly increased. Mildly increased left ventricular posterior wall thickness. There is mildly increased left ventricular hypertrophy.  2. Left ventricular diastolic parameters are consistent with Grade I diastolic dysfunction (impaired relaxation).  3. The left ventricle has no regional wall motion abnormalities.  4. Global right ventricle has normal systolic function.The right ventricular size is normal. No increase in right ventricular wall thickness.  5. Left atrial size was normal.  6. Right atrial size was normal.  7. The mitral valve is normal in structure. Trivial mitral valve regurgitation. No evidence of mitral stenosis.  8. The tricuspid valve is normal in structure.  9. The aortic valve is tricuspid. Aortic valve regurgitation is not visualized. No evidence of aortic valve sclerosis or stenosis. 10. The pulmonic valve was normal in structure. Pulmonic valve regurgitation is not visualized. 11. TR signal is inadequate for assessing pulmonary artery systolic pressure. 12. The inferior vena cava is normal in size with <50% respiratory variability, suggesting right atrial pressure of 8 mmHg. FINDINGS  Left Ventricle: Left ventricular ejection fraction, by visual estimation, is 55 to 60%. The left ventricle has normal function. The left ventricle has no regional wall motion abnormalities. The left ventricular internal cavity size was the left ventricle is normal in size. Mildly increased left ventricular posterior wall thickness. There is mildly increased left ventricular hypertrophy. Left ventricular diastolic parameters are consistent with Grade I diastolic dysfunction (impaired  relaxation). Normal left atrial pressure. LVEDP indeterminate. Right Ventricle: The right ventricular size is normal. No increase in right ventricular wall thickness. Global RV systolic function is has normal systolic function. Left Atrium: Left atrial size was normal in size. Right Atrium: Right atrial size was normal in size Pericardium: There is no evidence of pericardial effusion. Mitral Valve: The mitral valve is normal in structure. Trivial mitral valve regurgitation. No evidence of mitral valve stenosis by observation. Tricuspid Valve: The tricuspid valve is normal in structure. Tricuspid valve regurgitation is not demonstrated. Aortic Valve: The aortic valve is tricuspid. . There is mild thickening and mild calcification of the aortic valve. Aortic valve regurgitation is not visualized. The aortic valve is structurally normal, with no evidence of sclerosis or stenosis. There is  mild thickening of the aortic valve. There is mild calcification of the aortic valve. Pulmonic Valve: The pulmonic valve was normal in structure. Pulmonic valve regurgitation is not visualized. Pulmonic regurgitation is not visualized. Aorta: The aortic root, ascending aorta and aortic arch are all structurally normal, with no evidence of dilitation or obstruction. Venous: The inferior vena cava is normal in size with less than 50% respiratory variability, suggesting right atrial pressure of 8 mmHg. IAS/Shunts: No atrial level shunt detected by color flow Doppler. There is no evidence of a patent foramen ovale. No ventricular septal defect is seen or detected. There is no evidence of an atrial septal defect.  LEFT VENTRICLE PLAX 2D LVIDd:         4.60 cm  Diastology LVIDs:         2.80 cm  LV e' lateral:   5.55 cm/s LV PW:         1.20 cm  LV E/e' lateral: 14.0 LV IVS:        1.10 cm  LV e' medial:  6.42 cm/s LVOT diam:     2.00 cm  LV E/e' medial:  12.1 LV SV:         68 ml LV SV Index:   32.54 LVOT Area:     3.14 cm  RIGHT  VENTRICLE RV Basal diam:  2.70 cm RV S prime:     4.57 cm/s TAPSE (M-mode): 2.5 cm LEFT ATRIUM             Index       RIGHT ATRIUM           Index LA diam:        4.20 cm 2.08 cm/m  RA Area:     16.80 cm LA Vol (A2C):   21.9 ml 10.83 ml/m RA Volume:   41.00 ml  20.28 ml/m LA Vol (A4C):   45.8 ml 22.65 ml/m LA Biplane Vol: 32.5 ml 16.07 ml/m  AORTIC VALVE LVOT Vmax:   101.00 cm/s LVOT Vmean:  65.000 cm/s LVOT VTI:    0.194 m  AORTA Ao Root diam: 3.00 cm MITRAL VALVE MV Area (PHT): 3.53 cm              SHUNTS MV PHT:        62.35 msec            Systemic VTI:  0.19 m MV Decel Time: 215 msec              Systemic Diam: 2.00 cm MV E velocity: 77.60 cm/s  103 cm/s MV A velocity: 119.00 cm/s 70.3 cm/s MV E/A ratio:  0.65        1.5  Skeet Latch MD Electronically signed by Skeet Latch MD Signature Date/Time: 10/28/2019/4:14:52 PM    Final    Disposition   Pt is being discharged home today in good condition.  Follow-up Plans & Appointments    Follow-up Information    Tommie Raymond, NP Follow up on 11/12/2019.   Specialty: Cardiology Why: at 2:15pm for your follow up appt  Contact information: [address] [place] [id number] [phone]          Discharge Instructions    Amb Referral to Cardiac Rehabilitation   Complete by: As directed    Diagnosis: Coronary Stents   After initial evaluation and assessments completed: Virtual Based Care may be provided alone or in conjunction with Phase 2 Cardiac Rehab based on patient barriers.: Yes   Call MD for:  redness, tenderness, or signs of infection (pain, swelling, redness, odor or green/yellow discharge around incision site)   Complete by: As directed    Diet - low sodium heart healthy   Complete by: As directed    Discharge instructions   Complete by: As directed    Radial Site Care Refer to this sheet in the next few weeks. These instructions provide you with information on caring for yourself after your  procedure. Your caregiver may also give you more specific instructions. Your treatment has been planned according to current medical practices, but problems sometimes occur. Call your caregiver if you have any problems or questions after your procedure. HOME CARE INSTRUCTIONS You may shower the day after the procedure.Remove the bandage (dressing) and gently wash the site with plain soap and water.Gently pat the site dry.  Do not apply powder or lotion to the site.  Do not submerge the affected site in water for 3 to 5 days.  Inspect the site at least twice daily.  Do  not flex or bend the affected arm for 24 hours.  No lifting over 5 pounds (2.3 kg) for 5 days after your procedure.  Do not drive home if you are discharged the same day of the procedure. Have someone else drive you.  You may drive 24 hours after the procedure unless otherwise instructed by your caregiver.  What to expect: Any bruising will usually fade within 1 to 2 weeks.  Blood that collects in the tissue (hematoma) may be painful to the touch. It should usually decrease in size and tenderness within 1 to 2 weeks.  SEEK IMMEDIATE MEDICAL CARE IF: You have unusual pain at the radial site.  You have redness, warmth, swelling, or pain at the radial site.  You have drainage (other than a small amount of blood on the dressing).  You have chills.  You have a fever or persistent symptoms for more than 72 hours.  You have a fever and your symptoms suddenly get worse.  Your arm becomes pale, cool, tingly, or numb.  You have heavy bleeding from the site. Hold pressure on the site.   PLEASE DO NOT MISS ANY DOSES OF YOUR BRILINTA!!!!! Also keep a log of you blood pressures and bring back to your follow up appt. Please call the office with any questions.   Patients taking blood thinners should generally stay away from medicines like ibuprofen, Advil, Motrin, naproxen, and Aleve due to risk of stomach bleeding. You may take Tylenol as  directed or talk to your primary doctor about alternatives.   Increase activity slowly   Complete by: As directed        Discharge Medications     Medication List    STOP taking these medications   meloxicam 15 MG tablet Commonly known as: MOBIC     TAKE these medications   allopurinol 100 MG tablet Commonly known as: ZYLOPRIM Take 100 mg by mouth 2 (two) times daily.   amLODipine 5 MG tablet Commonly known as: NORVASC TAKE 1 TABLET BY MOUTH DAILY. What changed: when to take this   aspirin EC 81 MG tablet Take 81 mg by mouth daily.   colchicine 0.6 MG tablet Take 0.6 mg by mouth daily.   Flax Seed Oil 1000 MG Caps Take 1,000 mg by mouth 2 (two) times daily.   hydrochlorothiazide 25 MG tablet Commonly known as: HYDRODIURIL TAKE 1 TABLET BY MOUTH DAILY.   metoprolol succinate 50 MG 24 hr tablet Commonly known as: TOPROL-XL TAKE 1 TABLET BY MOUTH DAILY.   nitroGLYCERIN 0.4 MG SL tablet Commonly known as: NITROSTAT Place 1 tablet (0.4 mg total) under the tongue every 5 (five) minutes as needed for chest pain.   omeprazole 20 MG capsule Commonly known as: PRILOSEC TAKE 1 CAPSULE BY MOUTH DAILY   ramipril 10 MG capsule Commonly known as: ALTACE TAKE 1 CAPSULE BY MOUTH 2 TIMES DAILY. What changed: See the new instructions.   rosuvastatin 20 MG tablet Commonly known as: CRESTOR TAKE 1 TABLET BY MOUTH AT BEDTIME.   solifenacin 5 MG tablet Commonly known as: VESICARE Take 5 mg by mouth daily.   ticagrelor 90 MG Tabs tablet Commonly known as: BRILINTA Take 1 tablet (90 mg total) by mouth 2 (two) times daily.       Yes                               AHA/ACC Clinical Performance & Quality Measures:  1. Aspirin prescribed? - Yes 2. ADP Receptor Inhibitor (Plavix/Clopidogrel, Brilinta/Ticagrelor or Effient/Prasugrel) prescribed (includes medically managed patients)? - Yes 3. Beta Blocker prescribed? - Yes 4. High Intensity Statin (Lipitor 40-80mg  or  Crestor 20-40mg ) prescribed? - Yes 5. EF assessed during THIS hospitalization? - Yes 6. For EF <40%, was ACEI/ARB prescribed? - Not Applicable (EF >/= [id number]) 7. For EF <40%, Aldosterone Antagonist (Spironolactone or Eplerenone) prescribed? - Not Applicable (EF >/= [id number]) 8. Cardiac Rehab Phase II ordered (Included Medically managed Patients)? - Yes   Outstanding Labs/Studies   N/a   Duration of Discharge Encounter   Greater than 30 minutes including physician time.  Signed, Reino Bellis NP-C 10/29/2019, 10:59 AM

## 2019-10-30 ENCOUNTER — Telehealth (HOSPITAL_COMMUNITY): Payer: Self-pay

## 2019-10-30 NOTE — Telephone Encounter (Signed)
lpmtcb 12/31

## 2019-10-30 NOTE — Telephone Encounter (Signed)
Attempted to contact pt in regards to CR, LM on Vm.

## 2019-10-30 NOTE — Telephone Encounter (Signed)
Patient contacted regarding discharge from [facility] on 10/29/19.  Patient understands to follow up with provider Kathyrn Drown on 11/12/19 at 2:15 at [facility]. Patient understands discharge instructions? yes Patient understands medications and regiment? yes Patient understands to bring all medications to this visit? yes

## 2019-11-04 ENCOUNTER — Other Ambulatory Visit: Payer: Self-pay

## 2019-11-04 ENCOUNTER — Telehealth: Payer: Self-pay | Admitting: Nurse Practitioner

## 2019-11-04 ENCOUNTER — Ambulatory Visit: Payer: BC Managed Care – PPO | Admitting: Nurse Practitioner

## 2019-11-04 ENCOUNTER — Encounter: Payer: Self-pay | Admitting: Nurse Practitioner

## 2019-11-04 VITALS — BP 112/78 | HR 62 | Ht 68.5 in | Wt 193.8 lb

## 2019-11-04 DIAGNOSIS — Z7189 Other specified counseling: Secondary | ICD-10-CM

## 2019-11-04 DIAGNOSIS — K219 Gastro-esophageal reflux disease without esophagitis: Secondary | ICD-10-CM

## 2019-11-04 DIAGNOSIS — I10 Essential (primary) hypertension: Secondary | ICD-10-CM

## 2019-11-04 DIAGNOSIS — I2510 Atherosclerotic heart disease of native coronary artery without angina pectoris: Secondary | ICD-10-CM | POA: Diagnosis not present

## 2019-11-04 DIAGNOSIS — I200 Unstable angina: Secondary | ICD-10-CM

## 2019-11-04 DIAGNOSIS — Z955 Presence of coronary angioplasty implant and graft: Secondary | ICD-10-CM

## 2019-11-04 DIAGNOSIS — E7800 Pure hypercholesterolemia, unspecified: Secondary | ICD-10-CM | POA: Diagnosis not present

## 2019-11-04 MED ORDER — ISOSORBIDE MONONITRATE ER 30 MG PO TB24: 15.0000 mg | ORAL_TABLET | Freq: Every day | ORAL | 3 refills | Status: DC

## 2019-11-04 MED ORDER — OMEPRAZOLE 20 MG PO CPDR: 20.0000 mg | DELAYED_RELEASE_CAPSULE | Freq: Two times a day (BID) | ORAL | 6 refills | Status: DC

## 2019-11-04 NOTE — Telephone Encounter (Signed)
Spoke with pt who recently had a PCI to the RCA 12/29.  He reports he woke this am about 4:30 with chest pain in the center of his chest with some radiation to the right hand side of his chest.  He denies any SOB, N/V or diaphoresis at the time.  He got up, went to the bathroom, walked around a little bit and then went back to bed.  He states his pain last about 1 hour - resolved with rest.  He has been having some SOB off and on since his procedure but he feels this is r/t Brilinta.  BP 134/71 HR 63 then again 142/87 HR 58.  He generally walks a lot and Saturday walked about 2 miles throughout the day (not at once) without difficulty.  He c/o a lot of cath site soreness.  He denies any chest pain at this time but is very concerned.  Of note- he admits to eat 2 "Yum [employer]um hot dogs" last night which could have caused his discomfort.  He takes Prilosec daily.  History of alpha gal allergy but still eats meat - just takes an antihistamine.  Reviewed information with [name], NP who agrees to see the patient today.  He is scheduled for 2:30 pm.  He has no s/s of COVID, no fever, aware to wear a mask to the appt.

## 2019-11-04 NOTE — Patient Instructions (Addendum)
After Visit Summary:  We will be checking the following labs today - NONE   Medication Instructions:    Continue with your current medicines. BUT  I want you to increase your Prilosec twice a day  I want you to take Imdur 30 mg 1/2 tablet daily - this is for your heart - may cause a headache - ok to take this at night.    If you need a refill on your cardiac medications before your next appointment, please call your pharmacy.     Testing/Procedures To Be Arranged:  N/A  Follow-Up:   See me in about 10 days  Cancel visit with Sharee Pimple please.     At [facility], you and your health needs are our priority.  As part of our continuing mission to provide you with exceptional heart care, we have created designated Provider Care Teams.  These Care Teams include your primary Cardiologist (physician) and Advanced Practice Providers (APPs -  Physician Assistants and Nurse Practitioners) who all work together to provide you with the care you need, when you need it.  Special Instructions:  . Stay safe, stay home, wash your hands for at least 20 seconds and wear a mask when out in public.  . It was good to talk with you today.    Call the [place] office at [phone] if you have any questions, problems or concerns.

## 2019-11-04 NOTE — Telephone Encounter (Signed)
Pt c/o of Chest Pain: STAT if CP now or developed within 24 hours  1. Are you having CP right now? No, but happened at 4:30am and lasted about an hour  2. Are you experiencing any other symptoms (ex. SOB, nausea, vomiting, sweating)? no  3. How long have you been experiencing CP? Just started this morning  4. Is your CP continuous or coming and going? Coming and going  5. Have you taken Nitroglycerin? No because it started to ease off.  ?  BP 134/71 HR 63  Temperature 97.6  Patient has a TOC appt with Kathyrn Drown on 11/12/19.

## 2019-11-04 NOTE — Progress Notes (Signed)
CARDIOLOGY OFFICE NOTE  Date:  11/04/2019    Robert Reilly Date of Birth: 07-24-1941 Medical Record [id number]  PCP:  Alroy Dust, L.Marlou Sa, MD  Cardiologist:  Servando Snare [facility]  Chief Complaint  Patient presents with  . Chest Pain    Work in - seen for Dr. Marlou Porch    History of Present Illness: Robert Reilly is a 79 y.o. male who presents today for a work in visit. Seen for Dr. Marlou Porch but primarily follows with me.   He has a history of CAD with MI in 2004, with stent to RCA. Other issues include HTN, HLD and RA.He has had some situational stress with being in the caregiver role for his wife.   Just seen in mid December - elevators were broken here and he walked up all 3 flights of stairs without issue. He was felt to be doing ok - did advise him to stop his Mobic.   He then presented 2 weeks later with severe heartburn and chest pain - initially improved - then returned while eating spaghetti - took 4 baby aspirin and came to the ER. Admitted for cardiac cath. Found to have single vessel CAD of the mRCA with focal lesion above the prior RCA stent. On DAPT for 6 months but likely one year total. Echo with normal EF and G1DD.   Phone call today - "Spoke with pt who recently had a PCI to the RCA 12/29.  He reports he woke this am about 4:30 with chest pain in the center of his chest with some radiation to the right hand side of his chest.  He denies any SOB, N/V or diaphoresis at the time.  He got up, went to the bathroom, walked around alittle bit and then went back to bed.  He states his pain last about 1 hour - resolved with rest.  He has been having some SOB off and on since his procedure but he feels this is r/t Brilinta.  BP 134/71 HR 63 then again 142/87 HR 58.  He generally walks a lot and Saturday walked about 2 miles throughout the day (not at once) without difficulty.  He c/o a lot of cath site soreness.  He denies any chest pain at this time but is very concerned.  Of note- he  admits to eat 2 "Yum [employer]um hot dogs" last night which could have caused his discomfort.  He takes Prilosec daily."  Thus added to my schedule for this afternoon.   The patient does not have symptoms concerning for COVID-19 infection (fever, chills, cough, or new shortness of breath).   Comes in today. Here alone. He did well since his discharge - he actually walked on Saturday - stopped and started slowly - did about 2 miles. Little breathless with this but no chest pain. He had 2 hot dogs and ice cream. Went to bed. Woke up at 4:30 this morning - chest was hurting - little similar to what had led to him calling EMS. He crawled up closer to his wife - she is typically warmer - this made him feel better. Lasted about 45 minutes to an hour. Did not take anything. Went on back to sleep and woke up around 8:30. Feels fine now.   Past Medical History:  Diagnosis Date  . Acute myocardial infarction of other inferior wall, subsequent episode of care   . Cancer ([place])    melenoma  back , left arm   4-5 yrs ago  .  Coronary atherosclerosis of native coronary artery   . GERD (gastroesophageal reflux disease)   . Gout   . History of hiatal hernia   . Hypercholesteremia   . Hypertension   . Old MI (myocardial infarction) 09/10/2013   2004, stent to RCA  . Osteoarthritis   . Other disorder of calcium metabolism   . Tick bite 05/25/2016   states has had 5 in past month    Past Surgical History:  Procedure Laterality Date  . CARDIAC CATHETERIZATION    . CORONARY ANGIOPLASTY     stent x 1    12-15 yrs ago  . CORONARY STENT INTERVENTION N/A 10/28/2019   Procedure: CORONARY STENT INTERVENTION;  Surgeon: Leonie Man, MD;  Location: [facility];  Service: Cardiovascular;  Laterality: N/A;  . EYE SURGERY Bilateral    cataracts  . INSERTION OF MESH N/A 05/30/2016   Procedure: INSERTION OF MESH;  Surgeon: Jackolyn Confer, MD;  Location: [facility];  Service: General;  Laterality: N/A;  . LEFT HEART  CATH AND CORONARY ANGIOGRAPHY N/A 10/28/2019   Procedure: LEFT HEART CATH AND CORONARY ANGIOGRAPHY;  Surgeon: Leonie Man, MD;  Location: [facility];  Service: Cardiovascular;  Laterality: N/A;  . MELANOMA EXCISION     back, left arm  . SHOULDER ARTHROSCOPY WITH ROTATOR CUFF REPAIR Left   . UMBILICAL HERNIA REPAIR N/A 05/30/2016   Procedure: UMBILICAL HERNIA REPAIR;  Surgeon: Jackolyn Confer, MD;  Location: [facility];  Service: General;  Laterality: N/A;     Medications: Current Meds  Medication Sig  . allopurinol (ZYLOPRIM) 100 MG tablet Take 100 mg by mouth 2 (two) times daily.   Marland Kitchen amLODipine (NORVASC) 5 MG tablet Take 5 mg by mouth at bedtime.  Marland Kitchen aspirin EC 81 MG tablet Take 81 mg by mouth daily.  . calcium-vitamin D (OSCAL WITH D) 500-200 MG-UNIT tablet Take 1 tablet by mouth 2 (two) times daily.  . colchicine 0.6 MG tablet Take 0.6 mg by mouth daily.   . Flaxseed, Linseed, (FLAX SEED OIL) 1000 MG CAPS Take 1,000 mg by mouth 2 (two) times daily.   . hydrochlorothiazide (HYDRODIURIL) 25 MG tablet Take 25 mg by mouth daily.  . metoprolol succinate (TOPROL-XL) 50 MG 24 hr tablet Take 50 mg by mouth daily. Take with or immediately following a meal.  . Multiple Vitamins-Minerals (AIRBORNE GUMMIES PO) Take 2 tablets by mouth daily.  . nitroGLYCERIN (NITROSTAT) 0.4 MG SL tablet Place 1 tablet (0.4 mg total) under the tongue every 5 (five) minutes as needed for chest pain.  . nitroGLYCERIN (NITROSTAT) 0.4 MG SL tablet Place 0.4 mg under the tongue every 5 (five) minutes as needed for chest pain.  Marland Kitchen omeprazole (PRILOSEC) 20 MG capsule Take 1 capsule (20 mg total) by mouth 2 (two) times daily before a meal.  . ramipril (ALTACE) 10 MG capsule Take 10 mg by mouth 2 (two) times daily.  . rosuvastatin (CRESTOR) 20 MG tablet Take 20 mg by mouth at bedtime.  . solifenacin (VESICARE) 5 MG tablet Take 5 mg by mouth daily.  . ticagrelor (BRILINTA) 90 MG TABS tablet Take 1 tablet (90 mg total) by  mouth 2 (two) times daily.  . [DISCONTINUED] amLODipine (NORVASC) 5 MG tablet TAKE 1 TABLET BY MOUTH DAILY. (Patient taking differently: Take 5 mg by mouth at bedtime. )  . [DISCONTINUED] hydrochlorothiazide (HYDRODIURIL) 25 MG tablet TAKE 1 TABLET BY MOUTH DAILY. (Patient taking differently: Take 25 mg by mouth daily. )  . [  DISCONTINUED] metoprolol succinate (TOPROL-XL) 50 MG 24 hr tablet TAKE 1 TABLET BY MOUTH DAILY. (Patient taking differently: Take 50 mg by mouth daily. )  . [DISCONTINUED] omeprazole (PRILOSEC) 20 MG capsule TAKE 1 CAPSULE BY MOUTH DAILY  . [DISCONTINUED] ramipril (ALTACE) 10 MG capsule TAKE 1 CAPSULE BY MOUTH 2 TIMES DAILY. (Patient taking differently: Take 10 mg by mouth 2 (two) times daily. TAKE 1 CAPSULE BY MOUTH 2 TIMES DAILY.)  . [DISCONTINUED] rosuvastatin (CRESTOR) 20 MG tablet TAKE 1 TABLET BY MOUTH AT BEDTIME. (Patient taking differently: Take 20 mg by mouth at bedtime. )     Allergies: Allergies  Allergen Reactions  . Tetracyclines & Related Rash  . Other Other (See Comments)    No meat: Came from a tick bite     Social History: The patient  reports that he quit smoking about 18 years ago. His smoking use included cigarettes. He has quit using smokeless tobacco. He reports current alcohol use. He reports that he does not use drugs.   Family History: The patient's family history includes Cancer in his father; Stroke in his mother.   Review of Systems: Please see the history of present illness.   All other systems are reviewed and negative.   Physical Exam: VS:  BP 112/78 (BP Location: Left Arm, Patient Position: Sitting, Cuff Size: Normal)   Pulse 62   Ht 5' 8.5" (1.74 m)   Wt 193 lb 12.8 oz (87.9 kg)   BMI 29.04 kg/m  .  BMI Body mass index is 29.04 kg/m.  Wt Readings from Last 3 Encounters:  11/04/19 193 lb 12.8 oz (87.9 kg)  10/29/19 189 lb 9.6 oz (86 kg)  10/14/19 196 lb 12.8 oz (89.3 kg)    General: Pleasant. Well developed, well nourished  and in no acute distress.   HEENT: Normal.  Neck: Supple, no JVD, carotid bruits, or masses noted.  Cardiac: Regular rate and rhythm. No murmurs, rubs, or gallops. No edema.  Respiratory:  Lungs are clear to auscultation bilaterally with normal work of breathing.  GI: Soft and nontender.  MS: No deformity or atrophy. Gait and ROM intact.  Skin: Warm and dry. Color is normal.  Neuro:  Strength and sensation are intact and no gross focal deficits noted.  Psych: Alert, appropriate and with normal affect. Right arm with extensive bruising. 2+ radial pulse noted.    LABORATORY DATA:  EKG:  EKG is ordered today. This demonstrates sinus rhythm - inferior Q's - unchanged.   Lab Results  Component Value Date   WBC 9.2 10/29/2019   HGB 15.2 10/29/2019   HCT 44.7 10/29/2019   PLT 205 10/29/2019   GLUCOSE 102 (H) 10/29/2019   CHOL 124 10/14/2019   TRIG 137 10/14/2019   HDL 37 (L) 10/14/2019   LDLDIRECT 76.0 03/15/2015   LDLCALC 63 10/14/2019   ALT 25 10/28/2019   AST 25 10/28/2019   NA 139 10/29/2019   K 3.3 (L) 10/29/2019   CL 103 10/29/2019   CREATININE 0.99 10/29/2019   BUN 15 10/29/2019   CO2 26 10/29/2019   TSH 4.601 (H) 10/28/2019   HGBA1C 5.9 (H) 10/28/2019     BNP (last 3 results) No results for input(s): BNP in the last 8760 hours.  ProBNP (last 3 results) No results for input(s): PROBNP in the last 8760 hours.   Other Studies Reviewed Today:  Cath: [id number]   LV end diastolic pressure is mildly elevated.  The left ventricular systolic function is normal.  There is no aortic valve stenosis.  CULPRIT LESION: Mid RCA lesion is 90% stenosed.  A drug-eluting stent was successfully placed using a STENT RESOLUTE ONYX 3.0X12. - post-dilated to 3.35 mm  Post intervention, there is a 0% residual stenosis.  Previously placed Mid RCA to Dist RCA stent (DES) is widely patent.  Separate [place] for LAD & LCx  SUMMARY  Severe Single Vessel CAD - mRCA 90% focal  lesion upstream from m-dRCA stent  Successful DES PCI of mRCA (Resolute Onyx DES 3.0 x 12 -> post-dilated to 3.35 mm)  No significant disease in LAD & LCx systems - each wtih separate ostia.  Likely preserved LVEF (inadequate LV Gram to assess wall motion) with mildly elevated LVEDP   RECOMMENDATIONS  Overnight admission post PCI  TR Band removal per protocol  Continue to titrate IV NTG overnight for BP control  ASA/Brilinta DAPT x min 6 months uninterrupted (ok to interrupt after 6 months)  Ensure statin & Beta Blocker (pending HR monitoring overnight)   [name], MD, MS    TTE: 10/28/19 IMPRESSIONS  1. Left ventricular ejection fraction, by visual estimation, is 55 to 60%. The left ventricle has normal function. Left ventricular septal wall thickness was mildly increased. Mildly increased left ventricular posterior wall thickness. There is mildly  increased left ventricular hypertrophy. 2. Left ventricular diastolic parameters are consistent with Grade I diastolic dysfunction (impaired relaxation). 3. The left ventricle has no regional wall motion abnormalities. 4. Global right ventricle has normal systolic function.The right ventricular size is normal. No increase in right ventricular wall thickness. 5. Left atrial size was normal. 6. Right atrial size was normal. 7. The mitral valve is normal in structure. Trivial mitral valve regurgitation. No evidence of mitral stenosis. 8. The tricuspid valve is normal in structure. 9. The aortic valve is tricuspid. Aortic valve regurgitation is not visualized. No evidence of aortic valve sclerosis or stenosis. 10. The pulmonic valve was normal in structure. Pulmonic valve regurgitation is not visualized. 11. TR signal is inadequate for assessing pulmonary artery systolic pressure. 12. The inferior vena cava is normal in size with <50% respiratory variability, suggesting right atrial pressure of 8  mmHg.    Assessment/Plan:  1. Recent admission for unstable angina with PCI to the RCA - has single vessel disease - on DAPT. Needs aggressive CV risk factor modification.   2. Recurrent chest pain - somewhat similar to his presenting syndrome but also sounding GI - will increase his PPI to BID. Adding Imdur 15 mg a day. See back in about 10 days - if persists - may need to consider relook with cath.   3. HTN - BP looks good - no changes made today.   4. HLD - on statin  5. Gout  6. OA - told again to stop Mobic - now not taking.   7. COVID-19 Education: The signs and symptoms of COVID-19 were discussed with the patient and how to seek care for testing (follow up with PCP or arrange E-visit).  The importance of social distancing, staying at home, hand hygiene and wearing a mask when out in public were discussed today. He was going to touch base with PCP about trying to get vaccine.   Current medicines are reviewed with the patient today.  The patient does not have concerns regarding medicines other than what has been noted above.  The following changes have been made:  See above.  Labs/ tests ordered today include:    Orders Placed This  Encounter  Procedures  . EKG 12-Lead     Disposition:   FU with me in about 10 days.   Patient is agreeable to this plan and will call if any problems develop in the interim.   SignedTruitt Merle, NP  11/04/2019 3:25 PM  [facility] [address] [place], [place]  [address] Phone: [phone] Fax: [phone]

## 2019-11-12 ENCOUNTER — Ambulatory Visit: Payer: Managed Care, Other (non HMO) | Admitting: Cardiology

## 2019-11-14 NOTE — Progress Notes (Signed)
CARDIOLOGY OFFICE NOTE  Date:  11/17/2019    [name] Date of Birth: [birth year] Medical Record [id number]  PCP:  Alroy Dust, L.Marlou Sa, MD  Cardiologist:  Servando Snare [facility]  Chief Complaint  Patient presents with  . Follow-up    History of Present Illness: Robert Reilly is a 79 y.o. male who presents today for a 2 week check.  Seen for Dr. Marlou Porch but primarily follows with me.   He has a history of CAD with MI in 2004, with stent to RCA. Other issues include HTN, HLD and RA.He has had some situational stress with being in the caregiver role for his wife.   Just seen in mid December - elevators were broken here and he walked up all 3 flights of stairs without issue. He was felt to be doing ok - did advise him to stop his Mobic.   He then presented 2 weeks later with severe heartburn and chest pain - initially improved - then returned while eating spaghetti - took 4 baby aspirin and came to the ER. Admitted for cardiac cath. Found to have single vessel CAD of the mRCA with focal lesion above the prior RCA stent. On DAPT for 6 months but likely one year total. Echo with normal EF and G1DD.   I saw him 2 weeks ago shortly after his discharge due to chest pain . Added low dose Imdur and increased his PPI. Diet had been poor - had been eating hot dogs at [place].   The patient does not have symptoms concerning for COVID-19 infection (fever, chills, cough, or new shortness of breath).   Comes in today. Here alone. He is "some better". He is not having any exertional symptoms - he is still working as a Sales promotion account executive - walked 3 to 4 miles just the other day - did fine. Typically has some discomfort in the 7 to 9PM hours - may be similar to what he had that led to his recent admission - he wonders if this is also related to his pancreas - he had pancreatitis many years ago - he has stopped his alcohol since I last saw him. We had discussed this in the past. He has lost weight.     Past Medical History:  Diagnosis Date  . Acute myocardial infarction of other inferior wall, subsequent episode of care   . Cancer ([place])    melenoma  back , left arm   4-5 yrs ago  . Coronary atherosclerosis of native coronary artery   . GERD (gastroesophageal reflux disease)   . Gout   . History of hiatal hernia   . Hypercholesteremia   . Hypertension   . Old MI (myocardial infarction) 09/10/2013   2004, stent to RCA  . Osteoarthritis   . Other disorder of calcium metabolism   . Tick bite 05/25/2016   states has had 5 in past month    Past Surgical History:  Procedure Laterality Date  . CARDIAC CATHETERIZATION    . CORONARY ANGIOPLASTY     stent x 1    12-15 yrs ago  . CORONARY STENT INTERVENTION N/A 10/28/2019   Procedure: CORONARY STENT INTERVENTION;  Surgeon: Leonie Man, MD;  Location: [place] [facility];  Service: Cardiovascular;  Laterality: N/A;  . EYE SURGERY Bilateral    cataracts  . INSERTION OF MESH N/A 05/30/2016   Procedure: INSERTION OF MESH;  Surgeon: Jackolyn Confer, MD;  Location: [facility];  Service: General;  Laterality: N/A;  . LEFT HEART CATH AND CORONARY ANGIOGRAPHY N/A 10/28/2019   Procedure: LEFT HEART CATH AND CORONARY ANGIOGRAPHY;  Surgeon: Leonie Man, MD;  Location: [place] [facility];  Service: Cardiovascular;  Laterality: N/A;  . MELANOMA EXCISION     back, left arm  . SHOULDER ARTHROSCOPY WITH ROTATOR CUFF REPAIR Left   . UMBILICAL HERNIA REPAIR N/A 05/30/2016   Procedure: UMBILICAL HERNIA REPAIR;  Surgeon: Jackolyn Confer, MD;  Location: [facility];  Service: General;  Laterality: N/A;     Medications: Current Meds  Medication Sig  . allopurinol (ZYLOPRIM) 100 MG tablet Take 100 mg by mouth 2 (two) times daily.   Marland Kitchen amLODipine (NORVASC) 5 MG tablet Take 5 mg by mouth at bedtime.  Marland Kitchen aspirin EC 81 MG tablet Take 81 mg by mouth daily.  . calcium-vitamin D (OSCAL WITH D) 500-200 MG-UNIT tablet Take 1 tablet by mouth 2 (two) times daily.  .  colchicine 0.6 MG tablet Take 0.6 mg by mouth daily.   . Flaxseed, Linseed, (FLAX SEED OIL) 1000 MG CAPS Take 1,000 mg by mouth 2 (two) times daily.   . hydrochlorothiazide (HYDRODIURIL) 25 MG tablet Take 25 mg by mouth daily.  . isosorbide mononitrate (IMDUR) 30 MG 24 hr tablet Take 0.5 tablets (15 mg total) by mouth daily.  . metoprolol succinate (TOPROL-XL) 50 MG 24 hr tablet Take 50 mg by mouth daily. Take with or immediately following a meal.  . Multiple Vitamins-Minerals (AIRBORNE GUMMIES PO) Take 2 tablets by mouth daily.  . nitroGLYCERIN (NITROSTAT) 0.4 MG SL tablet Place 1 tablet (0.4 mg total) under the tongue every 5 (five) minutes as needed for chest pain.  Marland Kitchen omeprazole (PRILOSEC) 20 MG capsule Take 1 capsule (20 mg total) by mouth 2 (two) times daily before a meal.  . ramipril (ALTACE) 10 MG capsule Take 10 mg by mouth 2 (two) times daily.  . rosuvastatin (CRESTOR) 20 MG tablet Take 20 mg by mouth at bedtime.  . solifenacin (VESICARE) 5 MG tablet Take 5 mg by mouth daily.  . ticagrelor (BRILINTA) 90 MG TABS tablet Take 1 tablet (90 mg total) by mouth 2 (two) times daily.  . [DISCONTINUED] nitroGLYCERIN (NITROSTAT) 0.4 MG SL tablet Place 0.4 mg under the tongue every 5 (five) minutes as needed for chest pain.     Allergies: Allergies  Allergen Reactions  . Tetracyclines & Related Rash  . Other Other (See Comments)    No meat: Came from a tick bite     Social History: The patient  reports that he quit smoking about 18 years ago. His smoking use included cigarettes. He has quit using smokeless tobacco. He reports current alcohol use. He reports that he does not use drugs.   Family History: The patient's family history includes Cancer in his father; Stroke in his mother.   Review of Systems: Please see the history of present illness.   All other systems are reviewed and negative.   Physical Exam: VS:  BP 112/66   Pulse (!) 55   Ht 5' 8.5" (1.74 m)   Wt 187 lb (84.8 kg)    SpO2 94%   BMI 28.02 kg/m  .  BMI Body mass index is 28.02 kg/m.  Wt Readings from Last 3 Encounters:  11/17/19 187 lb (84.8 kg)  11/04/19 193 lb 12.8 oz (87.9 kg)  10/29/19 189 lb 9.6 oz (86 kg)    General: Pleasant. Well developed, well nourished and in no acute distress.  His weight is down a few pounds.  HEENT: Normal.  Neck: Supple, no JVD, carotid bruits, or masses noted.  Cardiac: Regular rate and rhythm. No murmurs, rubs, or gallops. No edema.  Respiratory:  Lungs are clear to auscultation bilaterally with normal work of breathing.  GI: Soft and nontender.  MS: No deformity or atrophy. Gait and ROM intact.  Skin: Warm and dry. Color is normal.  Neuro:  Strength and sensation are intact and no gross focal deficits noted.  Psych: Alert, appropriate and with normal affect.   LABORATORY DATA:  EKG:  EKG is not ordered today.  Lab Results  Component Value Date   WBC 9.2 10/29/2019   HGB 15.2 10/29/2019   HCT 44.7 10/29/2019   PLT 205 10/29/2019   GLUCOSE 102 (H) 10/29/2019   CHOL 124 10/14/2019   TRIG 137 10/14/2019   HDL 37 (L) 10/14/2019   LDLDIRECT 76.0 03/15/2015   LDLCALC 63 10/14/2019   ALT 25 10/28/2019   AST 25 10/28/2019   NA 139 10/29/2019   K 3.3 (L) 10/29/2019   CL 103 10/29/2019   CREATININE 0.99 10/29/2019   BUN 15 10/29/2019   CO2 26 10/29/2019   TSH 4.601 (H) 10/28/2019   HGBA1C 5.9 (H) 10/28/2019     BNP (last 3 results) No results for input(s): BNP in the last 8760 hours.  ProBNP (last 3 results) No results for input(s): PROBNP in the last 8760 hours.   Other Studies Reviewed Today:  Cath: [id number]   LV end diastolic pressure is mildly elevated.  The left ventricular systolic function is normal.  There is no aortic valve stenosis.  CULPRIT LESION: Mid RCA lesion is 90% stenosed.  A drug-eluting stent was successfully placed using a STENT RESOLUTE ONYX 3.0X12. - post-dilated to 3.35 mm  Post intervention, there is a 0%  residual stenosis.  Previously placed Mid RCA to Dist RCA stent (DES) is widely patent.  Separate [place] for LAD & LCx  SUMMARY  Severe Single Vessel CAD - mRCA 90% focal lesion upstream from m-dRCA stent  Successful DES PCI of mRCA (Resolute Onyx DES 3.0 x 12 -> post-dilated to 3.35 mm)  No significant disease in LAD & LCx systems - each wtih separate ostia.  Likely preserved LVEF (inadequate LV Gram to assess wall motion) with mildly elevated LVEDP   RECOMMENDATIONS  Overnight admission post PCI  TR Band removal per protocol  Continue to titrate IV NTG overnight for BP control  ASA/Brilinta DAPT x min 6 months uninterrupted (ok to interrupt after 6 months)  Ensure statin & Beta Blocker (pending HR monitoring overnight)   [name], MD, MS    TTE: 10/28/19 IMPRESSIONS  1. Left ventricular ejection fraction, by visual estimation, is 55 to 60%. The left ventricle has normal function. Left ventricular septal wall thickness was mildly increased. Mildly increased left ventricular posterior wall thickness. There is mildly  increased left ventricular hypertrophy. 2. Left ventricular diastolic parameters are consistent with Grade I diastolic dysfunction (impaired relaxation). 3. The left ventricle has no regional wall motion abnormalities. 4. Global right ventricle has normal systolic function.The right ventricular size is normal. No increase in right ventricular wall thickness. 5. Left atrial size was normal. 6. Right atrial size was normal. 7. The mitral valve is normal in structure. Trivial mitral valve regurgitation. No evidence of mitral stenosis. 8. The tricuspid valve is normal in structure. 9. The aortic valve is tricuspid. Aortic valve regurgitation is not visualized. No evidence of aortic  valve sclerosis or stenosis. 10. The pulmonic valve was normal in structure. Pulmonic valve regurgitation is not visualized. 11. TR signal is inadequate for  assessing pulmonary artery systolic pressure. 12. The inferior vena cava is normal in size with <50% respiratory variability, suggesting right atrial pressure of 8 mmHg.    Assessment/Plan:  1. Recent admission for unstable angina with subsequent cath and PCI to the RCA for single vessel disease - remains on DAPT.   2. Recurrent atypical chest pain - may be similar to prior presentation - he is also worried about pancreatitis - he has had some improvement with nitrate and increase of his PPI - I wanted to increase his Imdur today - he would like to try taking earlier - if no improvement, then he will increase to full tablet daily. Lab today to include amylase/lipase.   3. HTN - BP is ok.   4. HLD - on statin  5. OA - not discussed  6. COVID-19 Education: The signs and symptoms of COVID-19 were discussed with the patient and how to seek care for testing (follow up with PCP or arrange E-visit).  The importance of social distancing, staying at home, hand hygiene and wearing a mask when out in public were discussed today. He has signed up for his first COVID vaccine.   Current medicines are reviewed with the patient today.  The patient does not have concerns regarding medicines other than what has been noted above.  The following changes have been made:  See above.  Labs/ tests ordered today include:    Orders Placed This Encounter  Procedures  . Basic metabolic panel  . CBC  . Amylase  . Lipase     Disposition:   FU with me in about 2 weeks.    Patient is agreeable to this plan and will call if any problems develop in the interim.   SignedTruitt Merle, NP  11/17/2019 12:06 PM  [facility] [address] [place], [place]  [address] Phone: [phone] Fax: [phone]

## 2019-11-17 ENCOUNTER — Ambulatory Visit: Payer: BC Managed Care – PPO | Admitting: Nurse Practitioner

## 2019-11-17 ENCOUNTER — Encounter: Payer: Self-pay | Admitting: Nurse Practitioner

## 2019-11-17 ENCOUNTER — Other Ambulatory Visit: Payer: Self-pay

## 2019-11-17 VITALS — BP 112/66 | HR 55 | Ht 68.5 in | Wt 187.0 lb

## 2019-11-17 DIAGNOSIS — Z8719 Personal history of other diseases of the digestive system: Secondary | ICD-10-CM

## 2019-11-17 DIAGNOSIS — I200 Unstable angina: Secondary | ICD-10-CM

## 2019-11-17 DIAGNOSIS — R079 Chest pain, unspecified: Secondary | ICD-10-CM | POA: Diagnosis not present

## 2019-11-17 DIAGNOSIS — Z955 Presence of coronary angioplasty implant and graft: Secondary | ICD-10-CM

## 2019-11-17 DIAGNOSIS — K219 Gastro-esophageal reflux disease without esophagitis: Secondary | ICD-10-CM

## 2019-11-17 DIAGNOSIS — R109 Unspecified abdominal pain: Secondary | ICD-10-CM | POA: Diagnosis not present

## 2019-11-17 DIAGNOSIS — E7800 Pure hypercholesterolemia, unspecified: Secondary | ICD-10-CM

## 2019-11-17 DIAGNOSIS — I10 Essential (primary) hypertension: Secondary | ICD-10-CM

## 2019-11-17 DIAGNOSIS — Z7189 Other specified counseling: Secondary | ICD-10-CM

## 2019-11-17 NOTE — Patient Instructions (Addendum)
After Visit Summary:  We will be checking the following labs today - BMET, CBC, amylase, lipase   Medication Instructions:    Continue with your current medicines. BUT  I want you to take the Imdur earlier - try like 6 PM - if this does not help - increase to a whole tablet please.    If you need a refill on your cardiac medications before your next appointment, please call your pharmacy.     Testing/Procedures To Be Arranged:  N/A  Follow-Up:   See me in about 2 weeks.     At [facility], you and your health needs are our priority.  As part of our continuing mission to provide you with exceptional heart care, we have created designated Provider Care Teams.  These Care Teams include your primary Cardiologist (physician) and Advanced Practice Providers (APPs -  Physician Assistants and Nurse Practitioners) who all work together to provide you with the care you need, when you need it.  Special Instructions:  . Stay safe, stay home, wash your hands for at least 20 seconds and wear a mask when out in public.  . It was good to talk with you today.    Call the [place] office at [phone] if you have any questions, problems or concerns.

## 2019-11-18 LAB — CBC
Hematocrit: 43.5 % (ref 37.5–51.0)
Hemoglobin: 15.4 g/dL (ref 13.0–17.7)
MCH: 32.9 pg (ref 26.6–33.0)
MCHC: 35.4 g/dL (ref 31.5–35.7)
MCV: 93 fL (ref 79–97)
Platelets: 250 10*3/uL (ref 150–450)
RBC: 4.68 x10E6/uL (ref 4.14–5.80)
RDW: 13.1 % (ref 11.6–15.4)
WBC: 7.1 10*3/uL (ref 3.4–10.8)

## 2019-11-18 LAB — BASIC METABOLIC PANEL
BUN/Creatinine Ratio: 18 (ref 10–24)
BUN: 25 mg/dL (ref 8–27)
CO2: 27 mmol/L (ref 20–29)
Calcium: 9.8 mg/dL (ref 8.6–10.2)
Chloride: 102 mmol/L (ref 96–106)
Creatinine, Ser: 1.38 mg/dL — ABNORMAL HIGH (ref 0.76–1.27)
GFR calc Af Amer: 57 mL/min/{1.73_m2} — ABNORMAL LOW (ref >59)
GFR calc non Af Amer: 49 mL/min/{1.73_m2} — ABNORMAL LOW (ref >59)
Glucose: 102 mg/dL — ABNORMAL HIGH (ref 65–99)
Potassium: 3.9 mmol/L (ref 3.5–5.2)
Sodium: 143 mmol/L (ref 134–144)

## 2019-11-18 LAB — LIPASE: Lipase: 20 U/L (ref 13–78)

## 2019-11-18 LAB — AMYLASE: Amylase: 57 U/L (ref 31–110)

## 2019-11-21 ENCOUNTER — Ambulatory Visit: Payer: BC Managed Care – PPO | Attending: Internal Medicine

## 2019-11-21 DIAGNOSIS — Z23 Encounter for immunization: Secondary | ICD-10-CM | POA: Insufficient documentation

## 2019-11-21 NOTE — Progress Notes (Signed)
   Covid-19 Vaccination Clinic  Name:  DRAYMOND SUMP    MRN: [id number] DOB: [birth year]  11/21/2019  Mr. Hermans was observed post Covid-19 immunization for 15 minutes without incidence. He was provided with Vaccine Information Sheet and instruction to access the V-Safe system.   Mr. Kemna was instructed to call 911 with any severe reactions post vaccine: Marland Kitchen Difficulty breathing  . Swelling of your face and throat  . A fast heartbeat  . A bad rash all over your body  . Dizziness and weakness    Immunizations Administered    Name Date Dose VIS Date Route   Pfizer COVID-19 Vaccine 11/21/2019  3:22 PM 0.3 mL 10/10/2019 Intramuscular   Manufacturer: [place]   Lot: [id number]   [place]: [id number]

## 2019-11-26 NOTE — Progress Notes (Deleted)
CARDIOLOGY OFFICE NOTE  Date:  11/26/2019    Robert Reilly Date of Birth: 1941-07-13 Medical Record [id number]  PCP:  Robert Reilly, L.Marlou Sa, MD  Cardiologist:  Robert Reilly  No chief complaint on file.   History of Present Illness: Robert Reilly is a 79 y.o. male who presents today for a follow up visit. Seen for Dr. Marlou Reilly but primarily follows with me.   He has a history ofCAD with MI in 2004, with stent to RCA.Other issues include HTN, HLD and RA.He has had some situational stress with being in the caregiver role for his wife.  Just seen in mid December - elevators were brokenhereand he walked up all 3 flights of stairs without issue.He wasfelt to bedoing ok- did advise him to stop his Mobic.  He then presented 2 weeks later with severe heartburn and chest pain - initially improved - then returned while eating spaghetti - took 4 baby aspirin and came to the ER. Admitted for cardiac cath. Found to have single vessel CAD of the mRCA with focal lesion above the prior RCA stent. On DAPT for 6 months but likely one year total. Echo with normal EF and G1DD.   I saw him shortly after his discharge as a work in due to chest pain . Added low dose Imdur and increased his PPI. Diet had been poor - had been eating hot dogs at [place]. He was improved on follow up visit but not 100%. He was worried about possible pancreatitis (has had before) to the point he had stopped his alcohol.   The patient {does/does not:200015} have symptoms concerning for COVID-19 infection (fever, chills, cough, or new shortness of breath).   Comes in today. Here with   Past Medical History:  Diagnosis Date  . Acute myocardial infarction of other inferior wall, subsequent episode of care   . Cancer ([place])    melenoma  back , left arm   4-5 yrs ago  . Coronary atherosclerosis of native coronary artery   . GERD (gastroesophageal reflux disease)   . Gout   . History of hiatal hernia   .  Hypercholesteremia   . Hypertension   . Old MI (myocardial infarction) 09/10/2013   2004, stent to RCA  . Osteoarthritis   . Other disorder of calcium metabolism   . Tick bite 05/25/2016   states has had 5 in past month    Past Surgical History:  Procedure Laterality Date  . CARDIAC CATHETERIZATION    . CORONARY ANGIOPLASTY     stent x 1    12-15 yrs ago  . CORONARY STENT INTERVENTION N/A 10/28/2019   Procedure: CORONARY STENT INTERVENTION;  Surgeon: Robert Man, MD;  Location: [facility];  Service: Cardiovascular;  Laterality: N/A;  . EYE SURGERY Bilateral    cataracts  . INSERTION OF MESH N/A 05/30/2016   Procedure: INSERTION OF MESH;  Surgeon: Jackolyn Confer, MD;  [facility];  Service: General;  Laterality: N/A;  . LEFT HEART CATH AND CORONARY ANGIOGRAPHY N/A 10/28/2019   Procedure: LEFT HEART CATH AND CORONARY ANGIOGRAPHY;  Surgeon: Robert Man, MD;  Location: [facility];  Service: Cardiovascular;  Laterality: N/A;  . MELANOMA EXCISION     back, left arm  . SHOULDER ARTHROSCOPY WITH ROTATOR CUFF REPAIR Left   . UMBILICAL HERNIA REPAIR N/A 05/30/2016   Procedure: UMBILICAL HERNIA REPAIR;  Surgeon: Jackolyn Confer, MD;  [facility];  Service: General;  Laterality: N/A;     Medications: No outpatient medications have been marked as taking for the 12/03/19 encounter (Appointment) with Robert Junes, NP.     Allergies: Allergies  Allergen Reactions  . Tetracyclines & Related Rash  . Other Other (See Comments)    No meat: Came from a tick bite     Social History: The patient  reports that he quit smoking about 18 years ago. His smoking use included cigarettes. He has quit using smokeless tobacco. He reports current alcohol use. He reports that he does not use drugs.   Family History: The patient's ***family history includes Cancer in his father; Stroke in his mother.   Review of Systems: Please see the history of present illness.    All other systems are reviewed and negative.   Physical Exam: VS:  There were no vitals taken for this visit. Marland Kitchen  BMI There is no height or weight on file to calculate BMI.  Wt Readings from Last 3 Encounters:  11/17/19 187 lb (84.8 kg)  11/04/19 193 lb 12.8 oz (87.9 kg)  10/29/19 189 lb 9.6 oz (86 kg)    General: Pleasant. Well developed, well nourished and in no acute distress.   HEENT: Normal.  Neck: Supple, no JVD, carotid bruits, or masses noted.  Cardiac: ***Regular rate and rhythm. No murmurs, rubs, or gallops. No edema.  Respiratory:  Lungs are clear to auscultation bilaterally with normal work of breathing.  GI: Soft and nontender.  MS: No deformity or atrophy. Gait and ROM intact.  Skin: Warm and dry. Color is normal.  Neuro:  Strength and sensation are intact and no gross focal deficits noted.  Psych: Alert, appropriate and with normal affect.   LABORATORY DATA:  EKG:  EKG {ACTION; IS/IS [id number] ordered today. This demonstrates ***.  Lab Results  Component Value Date   WBC 7.1 11/17/2019   HGB 15.4 11/17/2019   HCT 43.5 11/17/2019   PLT 250 11/17/2019   GLUCOSE 102 (H) 11/17/2019   CHOL 124 10/14/2019   TRIG 137 10/14/2019   HDL 37 (L) 10/14/2019   LDLDIRECT 76.0 03/15/2015   LDLCALC 63 10/14/2019   ALT 25 10/28/2019   AST 25 10/28/2019   NA 143 11/17/2019   K 3.9 11/17/2019   CL 102 11/17/2019   CREATININE 1.38 (H) 11/17/2019   BUN 25 11/17/2019   CO2 27 11/17/2019   TSH 4.601 (H) 10/28/2019   HGBA1C 5.9 (H) 10/28/2019     BNP (last 3 results) No results for input(s): BNP in the last 8760 hours.  ProBNP (last 3 results) No results for input(s): PROBNP in the last 8760 hours.   Other Studies Reviewed Today:  Cath: [id number]   LV end diastolic pressure is mildly elevated.  The left ventricular systolic function is normal.  There is no aortic valve stenosis.  CULPRIT LESION: Mid RCA lesion is 90% stenosed.  A drug-eluting stent was  successfully placed using a STENT RESOLUTE ONYX 3.0X12. - post-dilated to 3.35 mm  Post intervention, there is a 0% residual stenosis.  Previously placed Mid RCA to Dist RCA stent (DES) is widely patent.  Separate [place] for LAD & LCx  SUMMARY  Severe Single Vessel CAD - mRCA 90% focal lesion upstream from m-dRCA stent  Successful DES PCI of mRCA (Resolute Onyx DES 3.0 x 12 -> post-dilated to 3.35 mm)  No significant disease in LAD & LCx systems - each wtih separate ostia.  Likely preserved LVEF (inadequate LV Gram to  assess wall motion) with mildly elevated LVEDP   RECOMMENDATIONS  Overnight admission post PCI  TR Band removal per protocol  Continue to titrate IV NTG overnight for BP control  ASA/Brilinta DAPT x min 6 months uninterrupted (ok to interrupt after 6 months)  Ensure statin & Beta Blocker (pending HR monitoring overnight)   [name], MD, MS    TTE: 10/28/19 IMPRESSIONS  1. Left ventricular ejection fraction, by visual estimation, is 55 to 60%. The left ventricle has normal function. Left ventricular septal wall thickness was mildly increased. Mildly increased left ventricular posterior wall thickness. There is mildly  increased left ventricular hypertrophy. 2. Left ventricular diastolic parameters are consistent with Grade I diastolic dysfunction (impaired relaxation). 3. The left ventricle has no regional wall motion abnormalities. 4. Global right ventricle has normal systolic function.The right ventricular size is normal. No increase in right ventricular wall thickness. 5. Left atrial size was normal. 6. Right atrial size was normal. 7. The mitral valve is normal in structure. Trivial mitral valve regurgitation. No evidence of mitral stenosis. 8. The tricuspid valve is normal in structure. 9. The aortic valve is tricuspid. Aortic valve regurgitation is not visualized. No evidence of aortic valve sclerosis or stenosis. 10. The  pulmonic valve was normal in structure. Pulmonic valve regurgitation is not visualized. 11. TR signal is inadequate for assessing pulmonary artery systolic pressure. 12. The inferior vena cava is normal in size with <50% respiratory variability, suggesting right atrial pressure of 8 mmHg.    Assessment/Plan:  1. Recent admission for unstable angina with subsequent cath and PCI to the RCA for single vessel disease - remains on DAPT.   2. Recurrent atypical chest pain - may be similar to prior presentation - he is also worried about pancreatitis - he has had some improvement with nitrate and increase of his PPI - I wanted to increase his Imdur today - he would like to try taking earlier - if no improvement, then he will increase to full tablet daily. Lab today to include amylase/lipase.   3. HTN - BP is ok.   4. HLD - on statin  5. OA - not discussed  . COVID-19 Education: The signs and symptoms of COVID-19 were discussed with the patient and how to seek care for testing (follow up with PCP or arrange E-visit).  The importance of social distancing, staying at home, hand hygiene and wearing a mask when out in public were discussed today.  Current medicines are reviewed with the patient today.  The patient does not have concerns regarding medicines other than what has been noted above.  The following changes have been made:  See above.  Labs/ tests ordered today include:   No orders of the defined types were placed in this encounter.    Disposition:   FU with *** in {gen number [id number] {Days to years:10300}.   Patient is agreeable to this plan and will call if any problems develop in the interim.   SignedTruitt Merle, NP  11/26/2019 8:26 AM  [facility] [address] [place], [place]  [address] Phone: [phone] Fax: [phone]

## 2019-12-03 ENCOUNTER — Ambulatory Visit: Payer: BC Managed Care – PPO | Admitting: Nurse Practitioner

## 2019-12-08 DIAGNOSIS — Z0000 Encounter for general adult medical examination without abnormal findings: Secondary | ICD-10-CM | POA: Diagnosis not present

## 2019-12-09 ENCOUNTER — Ambulatory Visit: Payer: BC Managed Care – PPO

## 2019-12-12 ENCOUNTER — Ambulatory Visit: Payer: BC Managed Care – PPO | Attending: Internal Medicine

## 2019-12-12 DIAGNOSIS — Z23 Encounter for immunization: Secondary | ICD-10-CM

## 2019-12-12 NOTE — Progress Notes (Signed)
   Covid-19 Vaccination Clinic  Name:  Robert Reilly    MRN: [id number] DOB: 08-18-1941  12/12/2019  [name] was observed post Covid-19 immunization for 15 minutes without incidence. He was provided with Vaccine Information Sheet and instruction to access the V-Safe system.   [name] was instructed to call 911 with any severe reactions post vaccine: Marland Kitchen Difficulty breathing  . Swelling of your face and throat  . A fast heartbeat  . A bad rash all over your body  . Dizziness and weakness    Immunizations Administered    Name Date Dose VIS Date Route   Pfizer COVID-19 Vaccine 12/12/2019  2:06 PM 0.3 mL 10/10/2019 Intramuscular   Manufacturer: [place]   Lot: [id number]   [place]: [id number]

## 2019-12-18 NOTE — Progress Notes (Signed)
CARDIOLOGY OFFICE NOTE  Date:  12/24/2019    Robert Reilly Date of Birth: December 06, 1940 Medical Record [id number]  PCP:  Alroy Dust, L.Marlou Sa, MD  Cardiologist:  Servando Snare [facility]  Chief Complaint  Patient presents with  . Follow-up    Seen for Dr. Marlou Porch    History of Present Illness: Robert Reilly is a 79 y.o. male who presents today for a follow up visit.  Seen for Dr. Marlou Porch but primarily follows with me.   He has a history ofCAD with MI in 2004, with stent to RCA.Other issues include HTN, HLD and RA.He has had some situational stress with being in the caregiver role for his wife.  Just seen in mid December - elevators were brokenhereand he walked up all 3 flights of stairs without issue.He wasfelt to bedoing ok- did advise him to stop his Mobic.  He then presented 2 weeks later with severe heartburn and chest pain - initially improved - then returned while eating spaghetti - took 4 baby aspirin and came to the ER. Admitted for cardiac cath. Found to have single vessel CAD of the mRCA with focal lesion above the prior RCA stent. On DAPT for 6 months but likely one year total. Echo with normal EF and G1DD.   I then saw him 2 weeks ago shortly after his discharge due to chest pain . Added low dose Imdur and increased his PPI. Diet had been poor - had been eating hot dogs at [place]. On return he was doing better but symptoms not totally resolved - he was concerned about possible pancreatitis - he had stopped his alcohol.   The patient does not have symptoms concerning for COVID-19 infection (fever, chills, cough, or new shortness of breath).   Comes in today. Here alone. He has had both COVID vaccines. 2nd caused diarrhea/stomach upset a few days later - now over. He wondered if it was from his other medicines. No more chest pain. Only on 15 mg of Imdur. Asking about whether he should continue. Very little alcohol. Breathing is ok. He is wanting to cut back medicines.  Has lost weight. Overall, no real concerns.   Past Medical History:  Diagnosis Date  . Acute myocardial infarction of other inferior wall, subsequent episode of care   . Cancer ([place])    melenoma  back , left arm   4-5 yrs ago  . Coronary atherosclerosis of native coronary artery   . GERD (gastroesophageal reflux disease)   . Gout   . History of hiatal hernia   . Hypercholesteremia   . Hypertension   . Old MI (myocardial infarction) 09/10/2013   2004, stent to RCA  . Osteoarthritis   . Other disorder of calcium metabolism   . Tick bite 05/25/2016   states has had 5 in past month    Past Surgical History:  Procedure Laterality Date  . CARDIAC CATHETERIZATION    . CORONARY ANGIOPLASTY     stent x 1    12-15 yrs ago  . CORONARY STENT INTERVENTION N/A 10/28/2019   Procedure: CORONARY STENT INTERVENTION;  Surgeon: Leonie Man, MD;  Location: [facility];  Service: Cardiovascular;  Laterality: N/A;  . EYE SURGERY Bilateral    cataracts  . INSERTION OF MESH N/A 05/30/2016   Procedure: INSERTION OF MESH;  Surgeon: Jackolyn Confer, MD;  Location: [facility];  Service: General;  Laterality: N/A;  . LEFT HEART CATH AND CORONARY ANGIOGRAPHY N/A 10/28/2019  Procedure: LEFT HEART CATH AND CORONARY ANGIOGRAPHY;  Surgeon: Leonie Man, MD;  Location: [facility];  Service: Cardiovascular;  Laterality: N/A;  . MELANOMA EXCISION     back, left arm  . SHOULDER ARTHROSCOPY WITH ROTATOR CUFF REPAIR Left   . UMBILICAL HERNIA REPAIR N/A 05/30/2016   Procedure: UMBILICAL HERNIA REPAIR;  Surgeon: Jackolyn Confer, MD;  Location: [facility];  Service: General;  Laterality: N/A;     Medications: Current Meds  Medication Sig  . allopurinol (ZYLOPRIM) 100 MG tablet Take 100 mg by mouth 2 (two) times daily.   Marland Kitchen amLODipine (NORVASC) 5 MG tablet Take 5 mg by mouth at bedtime.  Marland Kitchen aspirin EC 81 MG tablet Take 81 mg by mouth daily.  . calcium-vitamin D (OSCAL WITH D) 500-200 MG-UNIT tablet  Take 1 tablet by mouth 2 (two) times daily.  . colchicine 0.6 MG tablet Take 0.6 mg by mouth daily.   . Flaxseed, Linseed, (FLAX SEED OIL) 1000 MG CAPS Take 1,000 mg by mouth 2 (two) times daily.   . hydrochlorothiazide (HYDRODIURIL) 25 MG tablet Take 25 mg by mouth daily.  . isosorbide mononitrate (IMDUR) 30 MG 24 hr tablet Take 0.5 tablets (15 mg total) by mouth daily.  . metoprolol succinate (TOPROL-XL) 50 MG 24 hr tablet Take 50 mg by mouth daily. Take with or immediately following a meal.  . Multiple Vitamins-Minerals (AIRBORNE GUMMIES PO) Take 2 tablets by mouth daily.  . nitroGLYCERIN (NITROSTAT) 0.4 MG SL tablet Place 1 tablet (0.4 mg total) under the tongue every 5 (five) minutes as needed for chest pain.  Marland Kitchen omeprazole (PRILOSEC) 20 MG capsule Take 1 capsule (20 mg total) by mouth 2 (two) times daily before a meal.  . ramipril (ALTACE) 10 MG capsule Take 10 mg by mouth 2 (two) times daily.  . rosuvastatin (CRESTOR) 20 MG tablet Take 20 mg by mouth at bedtime.  . solifenacin (VESICARE) 5 MG tablet Take 5 mg by mouth daily.  . ticagrelor (BRILINTA) 90 MG TABS tablet Take 1 tablet (90 mg total) by mouth 2 (two) times daily.     Allergies: Allergies  Allergen Reactions  . Tetracyclines & Related Rash  . Other Other (See Comments)    No meat: Came from a tick bite     Social History: The patient  reports that he quit smoking about 18 years ago. His smoking use included cigarettes. He has quit using smokeless tobacco. He reports current alcohol use. He reports that he does not use drugs.   Family History: The patient's family history includes Cancer in his father; Stroke in his mother.   Review of Systems: Please see the history of present illness.   All other systems are reviewed and negative.   Physical Exam: VS:  BP 110/72   Pulse 60   Ht 5' 8.5" (1.74 m)   Wt 182 lb 12.8 oz (82.9 kg)   SpO2 98%   BMI 27.39 kg/m  .  BMI Body mass index is 27.39 kg/m.  Wt Readings from  Last 3 Encounters:  12/24/19 182 lb 12.8 oz (82.9 kg)  11/17/19 187 lb (84.8 kg)  11/04/19 193 lb 12.8 oz (87.9 kg)    General: Pleasant. Well developed, well nourished and in no acute distress.  Weight is down. Has lost 9 pounds over the past 8 weeks.  Cardiac: Regular rate and rhythm. No murmurs, rubs, or gallops. No edema.  Respiratory:  Lungs are clear to auscultation bilaterally with normal work  of breathing.  GI: Soft and nontender.  MS: No deformity or atrophy. Gait and ROM intact.  Skin: Warm and dry. Color is normal.  Neuro:  Strength and sensation are intact and no gross focal deficits noted.  Psych: Alert, appropriate and with normal affect.   LABORATORY DATA:  EKG:  EKG is not ordered today.   Lab Results  Component Value Date   WBC 7.1 11/17/2019   HGB 15.4 11/17/2019   HCT 43.5 11/17/2019   PLT 250 11/17/2019   GLUCOSE 102 (H) 11/17/2019   CHOL 124 10/14/2019   TRIG 137 10/14/2019   HDL 37 (L) 10/14/2019   LDLDIRECT 76.0 03/15/2015   LDLCALC 63 10/14/2019   ALT 25 10/28/2019   AST 25 10/28/2019   NA 143 11/17/2019   K 3.9 11/17/2019   CL 102 11/17/2019   CREATININE 1.38 (H) 11/17/2019   BUN 25 11/17/2019   CO2 27 11/17/2019   TSH 4.601 (H) 10/28/2019   HGBA1C 5.9 (H) 10/28/2019     BNP (last 3 results) No results for input(s): BNP in the last 8760 hours.  ProBNP (last 3 results) No results for input(s): PROBNP in the last 8760 hours.   Other Studies Reviewed Today:  Cath: [id number]   LV end diastolic pressure is mildly elevated.  The left ventricular systolic function is normal.  There is no aortic valve stenosis.  CULPRIT LESION: Mid RCA lesion is 90% stenosed.  A drug-eluting stent was successfully placed using a STENT RESOLUTE ONYX 3.0X12. - post-dilated to 3.35 mm  Post intervention, there is a 0% residual stenosis.  Previously placed Mid RCA to Dist RCA stent (DES) is widely patent.  Separate [place] for LAD &  LCx  SUMMARY  Severe Single Vessel CAD - mRCA 90% focal lesion upstream from m-dRCA stent  Successful DES PCI of mRCA (Resolute Onyx DES 3.0 x 12 -> post-dilated to 3.35 mm)  No significant disease in LAD & LCx systems - each wtih separate ostia.  Likely preserved LVEF (inadequate LV Gram to assess wall motion) with mildly elevated LVEDP   RECOMMENDATIONS  Overnight admission post PCI  TR Band removal per protocol  Continue to titrate IV NTG overnight for BP control  ASA/Brilinta DAPT x min 6 months uninterrupted (ok to interrupt after 6 months)  Ensure statin & Beta Blocker (pending HR monitoring overnight)   [name], MD, MS    TTE: 10/28/19 IMPRESSIONS  1. Left ventricular ejection fraction, by visual estimation, is 55 to 60%. The left ventricle has normal function. Left ventricular septal wall thickness was mildly increased. Mildly increased left ventricular posterior wall thickness. There is mildly  increased left ventricular hypertrophy. 2. Left ventricular diastolic parameters are consistent with Grade I diastolic dysfunction (impaired relaxation). 3. The left ventricle has no regional wall motion abnormalities. 4. Global right ventricle has normal systolic function.The right ventricular size is normal. No increase in right ventricular wall thickness. 5. Left atrial size was normal. 6. Right atrial size was normal. 7. The mitral valve is normal in structure. Trivial mitral valve regurgitation. No evidence of mitral stenosis. 8. The tricuspid valve is normal in structure. 9. The aortic valve is tricuspid. Aortic valve regurgitation is not visualized. No evidence of aortic valve sclerosis or stenosis. 10. The pulmonic valve was normal in structure. Pulmonic valve regurgitation is not visualized. 11. TR signal is inadequate for assessing pulmonary artery systolic pressure. 12. The inferior vena cava is normal in size with <50% respiratory  variability, suggesting  right atrial pressure of 8 mmHg.    Assessment/Plan:  1. Prior admission for unstable angina with cath and PCI to the RCA for single vessel disease - remains on DAPT.   2. Atypical chest pain - this is basically resolved with Imdur and higher dose of PPI - will continue these for now - on follow up if he continues to do well will stop the Imdur and cut the PPI.   3. HTN - BP is great - no changes made today.   4. HLD - on statin - doing well.   5. OA - not discussed  6. Intentional weight loss - has basically stopped his alcohol.   7. COVID-19 Education: The signs and symptoms of COVID-19 were discussed with the patient and how to seek care for testing (follow up with PCP or arrange E-visit).  The importance of social distancing, staying at home, hand hygiene and wearing a mask when out in public were discussed today.  Current medicines are reviewed with the patient today.  The patient does not have concerns regarding medicines other than what has been noted above.  The following changes have been made:  See above.  Labs/ tests ordered today include:   No orders of the defined types were placed in this encounter.    Disposition:   FU with me in about 3 months.    Patient is agreeable to this plan and will call if any problems develop in the interim.   SignedTruitt Merle, NP  12/24/2019 9:17 AM  [facility] [address] [place], [place]  [address] Phone: [phone] Fax: [phone]

## 2019-12-23 ENCOUNTER — Telehealth (HOSPITAL_COMMUNITY): Payer: Self-pay

## 2019-12-23 NOTE — Telephone Encounter (Signed)
Pt insurance is active and benefits verified through [employer] Co-pay 0, DED $1,750/0 met, out of pocket $3,750/$246.49 met, co-insurance 20%. no pre-authorization required. Passport, 12/23/2019'@8' :47am, REF# [id number]  Will contact patient to see if he is interested in the Cardiac Rehab Program. If interested, patient will need to complete follow up appt. Once completed, patient will be contacted for scheduling upon review by the RN Navigator.

## 2019-12-24 ENCOUNTER — Encounter: Payer: Self-pay | Admitting: Nurse Practitioner

## 2019-12-24 ENCOUNTER — Other Ambulatory Visit: Payer: Self-pay

## 2019-12-24 ENCOUNTER — Ambulatory Visit: Payer: BC Managed Care – PPO | Admitting: Nurse Practitioner

## 2019-12-24 VITALS — BP 110/72 | HR 60 | Ht 68.5 in | Wt 182.8 lb

## 2019-12-24 DIAGNOSIS — K219 Gastro-esophageal reflux disease without esophagitis: Secondary | ICD-10-CM | POA: Diagnosis not present

## 2019-12-24 DIAGNOSIS — Z955 Presence of coronary angioplasty implant and graft: Secondary | ICD-10-CM

## 2019-12-24 DIAGNOSIS — I10 Essential (primary) hypertension: Secondary | ICD-10-CM | POA: Diagnosis not present

## 2019-12-24 DIAGNOSIS — E7800 Pure hypercholesterolemia, unspecified: Secondary | ICD-10-CM | POA: Diagnosis not present

## 2019-12-24 DIAGNOSIS — Z7189 Other specified counseling: Secondary | ICD-10-CM

## 2019-12-24 NOTE — Patient Instructions (Addendum)
After Visit Summary:  We will be checking the following labs today - NONE   Medication Instructions:    Continue with your current medicines.    If you need a refill on your cardiac medications before your next appointment, please call your pharmacy.     Testing/Procedures To Be Arranged:  N/A  Follow-Up:   See me in about 3 months    At [facility], you and your health needs are our priority.  As part of our continuing mission to provide you with exceptional heart care, we have created designated Provider Care Teams.  These Care Teams include your primary Cardiologist (physician) and Advanced Practice Providers (APPs -  Physician Assistants and Nurse Practitioners) who all work together to provide you with the care you need, when you need it.  Special Instructions:  . Stay safe, stay home, wash your hands for at least 20 seconds and wear a mask when out in public.  . It was good to talk with you today.  . Keep up the good work!!   Call the [facility] office at [phone] if you have any questions, problems or concerns.       

## 2019-12-31 NOTE — Telephone Encounter (Signed)
Called pt to see if he was interested in the cardiac rehab program, pt stated no but then stated that he was out hunting and will call back. Placed pt ppw back in the black bin on the wall with a note on it.

## 2020-01-06 DIAGNOSIS — R768 Other specified abnormal immunological findings in serum: Secondary | ICD-10-CM | POA: Diagnosis not present

## 2020-01-06 DIAGNOSIS — M4690 Unspecified inflammatory spondylopathy, site unspecified: Secondary | ICD-10-CM | POA: Diagnosis not present

## 2020-01-06 DIAGNOSIS — Z1589 Genetic susceptibility to other disease: Secondary | ICD-10-CM | POA: Diagnosis not present

## 2020-01-06 DIAGNOSIS — M1A09X0 Idiopathic chronic gout, multiple sites, without tophus (tophi): Secondary | ICD-10-CM | POA: Diagnosis not present

## 2020-01-07 NOTE — Telephone Encounter (Signed)
Call pt to see if he was interested in the cardiac rehab program, pt stated that he hunts and walks 5 miles 3 days a week.and that he doesn't think he will benefit from the program. Advised pt about virtual CR since he does his own exercise on his own. Pt was interested and scheduled his orientation on 01/13/2020@8 :30am. Advised pt that he will get set up on the app at this orientation visit. Pt understood. Also advised pt where we was located pt understood.

## 2020-01-09 ENCOUNTER — Telehealth (HOSPITAL_COMMUNITY): Payer: Self-pay | Admitting: *Deleted

## 2020-01-09 ENCOUNTER — Other Ambulatory Visit: Payer: Self-pay

## 2020-01-09 ENCOUNTER — Encounter (HOSPITAL_COMMUNITY)
Admission: RE | Admit: 2020-01-09 | Discharge: 2020-01-09 | Disposition: A | Discharge status: Home / Self Care | Payer: Self-pay | Source: Ambulatory Visit | Attending: Cardiology | Admitting: Cardiology

## 2020-01-09 DIAGNOSIS — Z955 Presence of coronary angioplasty implant and graft: Secondary | ICD-10-CM | POA: Insufficient documentation

## 2020-01-09 NOTE — Telephone Encounter (Signed)
Cardiac Rehab Medication Review by a Pharmacist  Does the patient  feel that his/her medications are working for him/her?  yes  Has the patient been experiencing any side effects to the medications prescribed?  yes  Does the patient measure his/her own blood pressure or blood glucose at home?  Yes, patient takes blood pressure at home and averages between 130-140 but reports SBP is lower at doctors appointments. DBP is usually 80-90.   Does the patient have any problems obtaining medications due to transportation or finances?   no  Understanding of regimen: good Understanding of indications: good Potential of compliance: good    Pharmacist comments: N/A    Henri Medal 01/09/2020 4:19 PM

## 2020-01-09 NOTE — Telephone Encounter (Signed)
Spoke with the patient completed health history. Confirmed orientation appointment. Patient will bring smart phone with him on 01/09/20 to down load virtual cardiac rehab APP.Barnet Pall, RN,BSN 01/09/2020 3:02 PM

## 2020-01-13 ENCOUNTER — Encounter (HOSPITAL_COMMUNITY): Payer: Self-pay

## 2020-01-13 ENCOUNTER — Encounter: Payer: Self-pay | Admitting: Cardiology

## 2020-01-13 ENCOUNTER — Encounter (HOSPITAL_COMMUNITY)
Admission: RE | Admit: 2020-01-13 | Discharge: 2020-01-13 | Disposition: A | Discharge status: Home / Self Care | Payer: Self-pay | Source: Ambulatory Visit | Attending: Cardiology | Admitting: Cardiology

## 2020-01-13 ENCOUNTER — Other Ambulatory Visit: Payer: Self-pay

## 2020-01-13 VITALS — BP 98/64 | HR 64 | Temp 97.2°F | Ht 67.5 in | Wt 181.2 lb

## 2020-01-13 DIAGNOSIS — Z955 Presence of coronary angioplasty implant and graft: Secondary | ICD-10-CM

## 2020-01-13 NOTE — Progress Notes (Signed)
Patient will participate in the virtual cardiac rehab program via the [employer] app. Reviewed home exercise guidelines with patient including endpoints, temperature precautions, and rate of perceived exertion. Patient is walking at least 30 minutes at least two days/week while hunting as his mode of home exercise. Patient also has a treadmill and rower at home that he may pull out and use for his home exercise. Pt voices understanding of instructions given.  Robert Passer, MS, ACSM CEP

## 2020-01-13 NOTE — Progress Notes (Signed)
         Confirm Consent - In the setting of the current Covid19 crisis, you are scheduled for a phone visit with your Cardiac or Pulmonary team member.  Just as we do with many in-gym visits, in order for you to participate in this visit, we must obtain consent.  If you'd like, I can send this to your mychart (if signed up) or email for you to review.  Otherwise, I can obtain your verbal consent now.  By agreeing to a telephone visit, we'd like you to understand that the technology does not allow for your Cardiac or Pulmonary Rehab team member to perform a physical assessment, and thus may limit their ability to fully assess your ability to perform exercise programs. If your provider identifies any concerns that need to be evaluated in person, we will make arrangements to do so.  Finally, though the technology is pretty good, we cannot assure that it will always work on either your or our end and we cannot ensure that we have a secure connection.  Cardiac and Pulmonary Rehab Telehealth visits and "At Home" cardiac and pulmonary rehab are provided at no cost to you.        Are you willing to proceed?"        STAFF: Did the patient verbally acknowledge consent to telehealth visit? Document YES/NO here: Yes     Barnet Pall RN  Cardiac and Pulmonary Rehab Staff        Date 01/13/20    @ Time 0840        Pt is interested in participating in Virtual Cardiac and Pulmonary Rehab. Pt advised that Virtual Cardiac and Pulmonary Rehab is provided at no cost to the patient.  Checklist:  1. Pt has smart device  ie smartphone and/or ipad for downloading an app  Yes 2. Reliable internet/wifi service    Yes 3. Understands how to use their smartphone and navigate within an app.  Yes   Pt verbalized understanding and is in agreement.

## 2020-01-13 NOTE — Progress Notes (Signed)
Cardiac Individual Treatment Plan  Patient Details  Name: Robert Reilly MRN: [id number] Date of Birth: 26-Apr-1941 Referring Provider:     CARDIAC REHAB PHASE II ORIENTATION from 01/13/2020 in [facility]  Referring Provider  Candee Furbish, MD      Initial Encounter Date:    CARDIAC REHAB PHASE II ORIENTATION from 01/13/2020 in [place]  Date  01/13/20      Visit Diagnosis: S/P DES mRCA 10/28/19  Patient's Home Medications on Admission:  Current Outpatient Medications:  .  allopurinol (ZYLOPRIM) 100 MG tablet, Take 100 mg by mouth 2 (two) times daily. , Disp: , Rfl: 1 .  amLODipine (NORVASC) 5 MG tablet, Take 5 mg by mouth at bedtime., Disp: , Rfl:  .  aspirin EC 81 MG tablet, Take 81 mg by mouth daily., Disp: , Rfl:  .  calcium-vitamin D (OSCAL WITH D) 500-200 MG-UNIT tablet, Take 1 tablet by mouth 2 (two) times daily., Disp: , Rfl:  .  colchicine 0.6 MG tablet, Take 0.6 mg by mouth daily. , Disp: , Rfl:  .  Flaxseed, Linseed, (FLAX SEED OIL) 1000 MG CAPS, Take 1,000 mg by mouth 2 (two) times daily. , Disp: , Rfl:  .  hydrochlorothiazide (HYDRODIURIL) 25 MG tablet, Take 25 mg by mouth daily., Disp: , Rfl:  .  isosorbide mononitrate (IMDUR) 30 MG 24 hr tablet, Take 0.5 tablets (15 mg total) by mouth daily., Disp: 30 tablet, Rfl: 3 .  metoprolol succinate (TOPROL-XL) 50 MG 24 hr tablet, Take 50 mg by mouth daily. Take with or immediately following a meal., Disp: , Rfl:  .  Multiple Vitamins-Minerals (AIRBORNE GUMMIES PO), Take 2 tablets by mouth daily., Disp: , Rfl:  .  nitroGLYCERIN (NITROSTAT) 0.4 MG SL tablet, Place 1 tablet (0.4 mg total) under the tongue every 5 (five) minutes as needed for chest pain., Disp: 25 tablet, Rfl: 1 .  omeprazole (PRILOSEC) 20 MG capsule, Take 1 capsule (20 mg total) by mouth 2 (two) times daily before a meal., Disp: 60 capsule, Rfl: 6 .  ramipril (ALTACE) 10 MG capsule, Take 10 mg by mouth 2  (two) times daily., Disp: , Rfl:  .  rosuvastatin (CRESTOR) 20 MG tablet, Take 20 mg by mouth at bedtime., Disp: , Rfl:  .  solifenacin (VESICARE) 5 MG tablet, Take 5 mg by mouth daily., Disp: , Rfl:  .  ticagrelor (BRILINTA) 90 MG TABS tablet, Take 1 tablet (90 mg total) by mouth 2 (two) times daily., Disp: 180 tablet, Rfl: 2  Past Medical History: Past Medical History:  Diagnosis Date  . Acute myocardial infarction of other inferior wall, subsequent episode of care   . Cancer ([place])    melenoma  back , left arm   4-5 yrs ago  . Coronary atherosclerosis of native coronary artery   . GERD (gastroesophageal reflux disease)   . Gout   . History of hiatal hernia   . Hypercholesteremia   . Hypertension   . Old MI (myocardial infarction) 09/10/2013   2004, stent to RCA  . Osteoarthritis   . Other disorder of calcium metabolism   . Tick bite 05/25/2016   states has had 5 in past month    Tobacco Use: Social History   Tobacco Use  Smoking Status Former Smoker  . Types: Cigarettes  . Quit date: 05/25/2001  . 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  Labs for ITP Cardiac and Pulmonary Rehab Latest Ref Rng & Units 04/08/2018 10/09/2018 04/17/2019 10/14/2019 10/28/2019   Cholestrol 100 - 199 mg/dL 116 121 121 124 -   LDLCALC 0 - 99 mg/dL 58 58 52 63 -   LDLDIRECT mg/dL - - - - -   HDL >39 mg/dL 42 32(L) 32(L) 37(L) -   Trlycerides 0 - 149 mg/dL 82 156(H) 186(H) 137 -   Hemoglobin A1c 4.8 - 5.6 % - - - - 5.9(H)      Capillary Blood Glucose: No results found for: GLUCAP   Exercise Target Goals: Exercise Program Goal: Individual exercise prescription set using results from initial 6 min walk test and THRR while considering  patient's activity barriers and safety.   Exercise Prescription Goal: Starting with aerobic activity 30 plus minutes a day, 3 days per week for initial exercise prescription. Provide home exercise  prescription and guidelines that participant acknowledges understanding prior to discharge.  Activity Barriers & Risk Stratification: Activity Barriers & Cardiac Risk Stratification - 01/13/20 0859      Activity Barriers & Cardiac Risk Stratification   Activity Barriers  Arthritis;Other (comment)    Comments  Rheumatoid arthritis, gout in feet and ankles.    Cardiac Risk Stratification  Moderate       6 Minute Walk: 6 Minute Walk    Row Name 01/13/20 0925         6 Minute Walk   Phase  Initial     Distance  1742 feet     Walk Time  6 minutes     # of Rest Breaks  0     MPH  3.3     METS  3.05     RPE  12     Perceived Dyspnea   0     VO2 Peak  10.67     Symptoms  No     Resting HR  64 bpm     Resting BP  98/64     Resting Oxygen Saturation   99 %     Exercise Oxygen Saturation  during 6 min walk  97 %     Max Ex. HR  79 bpm     Max Ex. BP  128/72     2 Minute Post BP  98/72        Oxygen Initial Assessment:   Oxygen Re-Evaluation:   Oxygen Discharge (Final Oxygen Re-Evaluation):   Initial Exercise Prescription: Initial Exercise Prescription - 01/13/20 1400      Date of Initial Exercise RX and Referring Provider   Date  01/13/20    Referring Provider  Candee Furbish, MD    Expected Discharge Date  03/12/20      Track   Minutes  30    METs  3.3      Prescription Details   Frequency (times per week)  5-7    Duration  Progress to 30 minutes of continuous aerobic without signs/symptoms of physical distress      Intensity   THRR 40-80% of Max Heartrate  57-114    Ratings of Perceived Exertion  11-13    Perceived Dyspnea  0-4      Progression   Progression  Continue to progress workloads to maintain intensity without signs/symptoms of physical distress.      Resistance Training   Training Prescription  Yes    Weight  5lbs.    Reps  10-15       Perform Capillary Blood Glucose  checks as needed.  Exercise Prescription Changes:   Exercise  Comments: Exercise Comments    Row Name 01/13/20 0851           Exercise Comments  Reviewed home exericse guidelines with patient.          Exercise Goals and Review: Exercise Goals    Row Name 01/13/20 0858             Exercise Goals   Increase Physical Activity  Yes       Intervention  Provide advice, education, support and counseling about physical activity/exercise needs.;Develop an individualized exercise prescription for aerobic and resistive training based on initial evaluation findings, risk stratification, comorbidities and participant's personal goals.       Expected Outcomes  Short Term: Attend rehab on a regular basis to increase amount of physical activity.;Long Term: Exercising regularly at least 3-5 days a week.;Long Term: Add in home exercise to make exercise part of routine and to increase amount of physical activity.       Increase Strength and Stamina  Yes       Intervention  Provide advice, education, support and counseling about physical activity/exercise needs.;Develop an individualized exercise prescription for aerobic and resistive training based on initial evaluation findings, risk stratification, comorbidities and participant's personal goals.       Expected Outcomes  Short Term: Increase workloads from initial exercise prescription for resistance, speed, and METs.;Short Term: Perform resistance training exercises routinely during rehab and add in resistance training at home;Long Term: Improve cardiorespiratory fitness, muscular endurance and strength as measured by increased METs and functional capacity (6MWT)       Able to understand and use rate of perceived exertion (RPE) scale  Yes       Intervention  Provide education and explanation on how to use RPE scale       Expected Outcomes  Short Term: Able to use RPE daily in rehab to express subjective intensity level;Long Term:  Able to use RPE to guide intensity level when exercising independently        Knowledge and understanding of Target Heart Rate Range (THRR)  Yes       Intervention  Provide education and explanation of THRR including how the numbers were predicted and where they are located for reference       Expected Outcomes  Short Term: Able to state/look up THRR;Long Term: Able to use THRR to govern intensity when exercising independently;Short Term: Able to use daily as guideline for intensity in rehab       Able to check pulse independently  Yes       Intervention  Provide education and demonstration on how to check pulse in carotid and radial arteries.;Review the importance of being able to check your own pulse for safety during independent exercise       Expected Outcomes  Short Term: Able to explain why pulse checking is important during independent exercise;Long Term: Able to check pulse independently and accurately       Understanding of Exercise Prescription  Yes       Intervention  Provide education, explanation, and written materials on patient's individual exercise prescription       Expected Outcomes  Short Term: Able to explain program exercise prescription;Long Term: Able to explain home exercise prescription to exercise independently          Exercise Goals Re-Evaluation :    Discharge Exercise Prescription (Final Exercise Prescription Changes):   Nutrition:  Target Goals:  Understanding of nutrition guidelines, daily intake of sodium 1500mg , cholesterol 200mg , calories 30% from fat and 7% or less from saturated fats, daily to have 5 or more servings of fruits and vegetables.  Biometrics: Pre Biometrics - 01/13/20 0851      Pre Biometrics   Height  5' 7.5" (1.715 m)    Weight  82.2 kg    Waist Circumference  40.75 inches    Hip Circumference  40.5 inches    Waist to Hip Ratio  1.01 %    BMI (Calculated)  27.95    Triceps Skinfold  15 mm    % Body Fat  28.4 %    Grip Strength  50.5 kg    Flexibility  13.75 in    Single Leg Stand  7.75 seconds         Nutrition Therapy Plan and Nutrition Goals:   Nutrition Assessments:   Nutrition Goals Re-Evaluation:   Nutrition Goals Discharge (Final Nutrition Goals Re-Evaluation):   Psychosocial: Target Goals: Acknowledge presence or absence of significant depression and/or stress, maximize coping skills, provide positive support system. Participant is able to verbalize types and ability to use techniques and skills needed for reducing stress and depression.  Initial Review & Psychosocial Screening: Initial Psych Review & Screening - 01/13/20 1002      Initial Review   Current issues with  Current Anxiety/Panic   Patient reports feeling anxiety regarding the COVID 19 pandemic     Family Dynamics   Good Support System?  Yes   Patient has his wife for support     Barriers   Psychosocial barriers to participate in program  The patient should benefit from training in stress management and relaxation.      Screening Interventions   Interventions  Encouraged to exercise    Expected Outcomes  Long Term Goal: Stressors or current issues are controlled or eliminated.       Quality of Life Scores: Quality of Life - 01/13/20 1451      Quality of Life   Select  Quality of Life      Quality of Life Scores   Health/Function Pre  24.13 %    Socioeconomic Pre  27.81 %    Psych/Spiritual Pre  27.93 %    Family Pre  25.9 %    GLOBAL Pre  25.99 %      Scores of 19 and below usually indicate a poorer quality of life in these areas.  A difference of  2-3 points is a clinically meaningful difference.  A difference of 2-3 points in the total score of the Quality of Life Index has been associated with significant improvement in overall quality of life, self-image, physical symptoms, and general health in studies assessing change in quality of life.  PHQ-9: Recent Review Flowsheet Data    Depression screen [facility] 2/9 01/13/2020   Decreased Interest 0   Down, Depressed, Hopeless 0   PHQ - 2  Score 0     Interpretation of Total Score  Total Score Depression Severity:  1-4 = Minimal depression, 5-9 = Mild depression, 10-14 = Moderate depression, 15-19 = Moderately severe depression, 20-27 = Severe depression   Psychosocial Evaluation and Intervention:   Psychosocial Re-Evaluation:   Psychosocial Discharge (Final Psychosocial Re-Evaluation):   Vocational Rehabilitation: Provide vocational rehab assistance to qualifying candidates.   Vocational Rehab Evaluation & Intervention: Vocational Rehab - 01/13/20 0904      Initial Vocational Rehab Evaluation & Intervention   Assessment shows  need for Vocational Rehabilitation  No       Education: Education Goals: Education classes will be provided on a weekly basis, covering required topics. Participant will state understanding/return demonstration of topics presented.  Learning Barriers/Preferences: Learning Barriers/Preferences - 01/13/20 1539      Learning Barriers/Preferences   Learning Barriers  Exercise Concerns   patient reports that he feels dizzy sometimes when he changes positions   Learning Preferences  Skilled Demonstration;Pictoral       Education Topics: Hypertension, Hypertension Reduction -Define heart disease and high blood pressure. Discus how high blood pressure affects the body and ways to reduce high blood pressure.   Exercise and Your Heart -Discuss why it is important to exercise, the FITT principles of exercise, normal and abnormal responses to exercise, and how to exercise safely.   Angina -Discuss definition of angina, causes of angina, treatment of angina, and how to decrease risk of having angina.   Cardiac Medications -Review what the following cardiac medications are used for, how they affect the body, and side effects that may occur when taking the medications.  Medications include Aspirin, Beta blockers, calcium channel blockers, ACE Inhibitors, angiotensin receptor blockers,  diuretics, digoxin, and antihyperlipidemics.   Congestive Heart Failure -Discuss the definition of CHF, how to live with CHF, the signs and symptoms of CHF, and how keep track of weight and sodium intake.   Heart Disease and Intimacy -Discus the effect sexual activity has on the heart, how changes occur during intimacy as we age, and safety during sexual activity.   Smoking Cessation / COPD -Discuss different methods to quit smoking, the health benefits of quitting smoking, and the definition of COPD.   Nutrition I: Fats -Discuss the types of cholesterol, what cholesterol does to the heart, and how cholesterol levels can be controlled.   Nutrition II: Labels -Discuss the different components of food labels and how to read food label   Heart Parts/Heart Disease and PAD -Discuss the anatomy of the heart, the pathway of blood circulation through the heart, and these are affected by heart disease.   Stress I: Signs and Symptoms -Discuss the causes of stress, how stress may lead to anxiety and depression, and ways to limit stress.   Stress II: Relaxation -Discuss different types of relaxation techniques to limit stress.   Warning Signs of Stroke / TIA -Discuss definition of a stroke, what the signs and symptoms are of a stroke, and how to identify when someone is having stroke.   Knowledge Questionnaire Score: Knowledge Questionnaire Score - 01/13/20 1002      Knowledge Questionnaire Score   Pre Score  19/24       Core Components/Risk Factors/Patient Goals at Admission: Personal Goals and Risk Factors at Admission - 01/13/20 1452      Core Components/Risk Factors/Patient Goals on Admission    Weight Management  Weight Maintenance;Yes    Intervention  Weight Management: Develop a combined nutrition and exercise program designed to reach desired caloric intake, while maintaining appropriate intake of nutrient and fiber, sodium and fats, and appropriate energy expenditure  required for the weight goal.;Weight Management: Provide education and appropriate resources to help participant work on and attain dietary goals.    Admit Weight  181 lb 3.5 oz (82.2 kg)    Expected Outcomes  Short Term: Continue to assess and modify interventions until short term weight is achieved;Long Term: Adherence to nutrition and physical activity/exercise program aimed toward attainment of established weight goal;Weight Maintenance: Understanding of the  daily nutrition guidelines, which includes 25-35% calories from fat, 7% or less cal from saturated fats, less than 200mg  cholesterol, less than 1.5gm of sodium, & 5 or more servings of fruits and vegetables daily;Understanding recommendations for meals to include 15-35% energy as protein, 25-35% energy from fat, 35-60% energy from carbohydrates, less than 200mg  of dietary cholesterol, 20-35 gm of total fiber daily;Understanding of distribution of calorie intake throughout the day with the consumption of 4-5 meals/snacks    Hypertension  Yes    Intervention  Provide education on lifestyle modifcations including regular physical activity/exercise, weight management, moderate sodium restriction and increased consumption of fresh fruit, vegetables, and low fat dairy, alcohol moderation, and smoking cessation.;Monitor prescription use compliance.    Expected Outcomes  Short Term: Continued assessment and intervention until BP is < 140/65mm HG in hypertensive participants. < 130/50mm HG in hypertensive participants with diabetes, heart failure or chronic kidney disease.;Long Term: Maintenance of blood pressure at goal levels.    Lipids  Yes    Intervention  Provide education and support for participant on nutrition & aerobic/resistive exercise along with prescribed medications to achieve LDL 70mg , HDL >40mg .    Expected Outcomes  Short Term: Participant states understanding of desired cholesterol values and is compliant with medications prescribed.  Participant is following exercise prescription and nutrition guidelines.;Long Term: Cholesterol controlled with medications as prescribed, with individualized exercise RX and with personalized nutrition plan. Value goals: LDL < 70mg , HDL > 40 mg.    Stress  Yes    Intervention  Offer individual and/or small group education and counseling on adjustment to heart disease, stress management and health-related lifestyle change. Teach and support self-help strategies.;Refer participants experiencing significant psychosocial distress to appropriate mental health specialists for further evaluation and treatment. When possible, include family members and significant others in education/counseling sessions.    Expected Outcomes  Short Term: Participant demonstrates changes in health-related behavior, relaxation and other stress management skills, ability to obtain effective social support, and compliance with psychotropic medications if prescribed.;Long Term: Emotional wellbeing is indicated by absence of clinically significant psychosocial distress or social isolation.       Core Components/Risk Factors/Patient Goals Review:    Core Components/Risk Factors/Patient Goals at Discharge (Final Review):    ITP Comments: ITP Comments    Row Name 01/13/20 0858           ITP Comments  Dr Fransico Him MD, Medical Director          Comments: Robert Reilly attended orientation on 01/13/2020 to review rules and guidelines for program.  Completed 6 minute walk test, Intitial ITP, and exercise prescription.  VSS. Telemetry-Sinus Rhythm with a downward.Sitting blood pressure 98/64 Standing blood pressure 98/64. Robert Reilly reports that he sometimes feels dizzy when he changes positions at home especially when bending over. Robert Reilly denied feeling dizzy today at cardiac rehab.Patient complained of chronic bilateral feet and ankle pain from gout and RA.  Safety measures and social distancing in place per CDC guidelines. Will notify  [name] NP about the patient's complaints. Robert Reilly denied complaints upon exit from cardiac rehab. Patient plans to participate in virtual cardiac rehab and was able to download the [facility] virtual cardiac rehab app without difficulty.Barnet Pall, RN,BSN 01/13/2020 4:04 PM

## 2020-01-15 MED ORDER — RAMIPRIL 10 MG PO CAPS: 10.0000 mg | ORAL_CAPSULE | Freq: Every day | ORAL | 3 refills | Status: DC

## 2020-01-15 NOTE — Telephone Encounter (Signed)
-----   Message from Burtis Junes, NP sent at 01/14/2020 10:07 AM EDT ----- Regarding: FW: low resting BP's at cardiac rehab Let's have him decrease his Altace to just once a day.  Continue to monitor the BP.  Cecille Rubin ----- Message ----- From: Magda Kiel, RN Sent: 01/14/2020   8:38 AM EDT To: Burtis Junes, NP Subject: low resting BP's at cardiac rehab              Good morning Cecille Rubin,  I hope that you are doing well. Mr Reamy attended orientation at cardiac rehab as he will be participating in virtual cardiac rehab. Mr Schuler did say that he feel dizzy when he bends over at home from time to time..Mr Betke vital signs from orientation yesterday are as follows.  Pre walk test sitting 98/64 standing 98/64 heart rate 64 Walk test 128/72 max heart rate 79 Post exercise walk test 98/72  Mr Buschman had no symptoms or complaints yesterday.  Is there any way you could review his medications and make changes as you feel appropriate?  Thanks for your assistance,  Sincerely, Verdis Frederickson.  I will fax the exercise flow sheet over as well

## 2020-01-15 NOTE — Telephone Encounter (Signed)
Noted  

## 2020-01-15 NOTE — Telephone Encounter (Signed)
S/w pt is aware of recommendation's.  Pt will cut back Altace,  one tablet (10 mg) daily, medication list updated.  Pt would like to discuss D/C imdur at ov in May.  Pt stated was having two drinks daily and since cutting back indigestion gone.  Does get back pain or per pt pancreatic  pain at the end of pts hikes or walks.  Pt will bring bp readings to ov in May and bp cuff to check. Will send to [place] to [place].

## 2020-01-16 ENCOUNTER — Encounter (HOSPITAL_COMMUNITY)
Admission: RE | Admit: 2020-01-16 | Discharge: 2020-01-16 | Disposition: A | Discharge status: Home / Self Care | Payer: BC Managed Care – PPO | Source: Ambulatory Visit | Attending: Cardiology | Admitting: Cardiology

## 2020-01-16 ENCOUNTER — Other Ambulatory Visit: Payer: Self-pay

## 2020-01-16 NOTE — Progress Notes (Signed)
Virtual Cardiac Rehab: Nutrition Note  Successful phone encounter with pt to schedule a virtual nutrition assessment.Scheduled for March 30th at 9 am. Informed pt that this will be a telephone visit. Pt has no questions or concerns today.   Michaele Offer, MS, RDN, LDN

## 2020-01-19 ENCOUNTER — Other Ambulatory Visit: Payer: Self-pay

## 2020-01-19 ENCOUNTER — Encounter (HOSPITAL_COMMUNITY)
Admission: RE | Admit: 2020-01-19 | Discharge: 2020-01-19 | Disposition: A | Discharge status: Home / Self Care | Payer: Self-pay | Source: Ambulatory Visit | Attending: Cardiology | Admitting: Cardiology

## 2020-01-19 NOTE — Progress Notes (Signed)
Virtual Cardiac Rehab Note:  Successful telephone encounter to [employer]. Vegh to follow up on logged symptoms with exercise, documented in the [facility] virtual cardiac rehab app. Per Mr. Robert Reilly, this pain is located in the "middle of my back on both sides, but as I exercise it moves to the center of my back down my spine". Mr. Robert Reilly also states he "wakes up with this sharp pain". This pain is worse with ambulation and subsides with rest. It is non-radiating and does not feel like his previous angina symptom of "indigestion". He denies N/V, dizziness, or worsening shortness of breath when pain occurs. This pain has been intermittent for 3 weeks and is not worsening or improving. It does not limit his activities as he "pushes through the pain". Mr. Robert Reilly believes it is his kidneys as "all this medication is not good for my kidneys".  He is strongly encouraged to contact his PCP in the am. He is educated on s/s of cardiovascular emergencies and understands when to call 911.  Atavia Poppe E. Rollene Rotunda RN, BSN [place]. [facility]  Cardiac and Pulmonary Rehabilitation Phone: [phone] Fax: [phone]

## 2020-01-21 ENCOUNTER — Encounter (HOSPITAL_COMMUNITY)
Admission: RE | Admit: 2020-01-21 | Discharge: 2020-01-21 | Disposition: A | Discharge status: Home / Self Care | Payer: Self-pay | Source: Ambulatory Visit | Attending: Cardiology | Admitting: Cardiology

## 2020-01-21 ENCOUNTER — Other Ambulatory Visit: Payer: Self-pay

## 2020-01-21 NOTE — Progress Notes (Signed)
Virtual Cardiac Rehab Note:  Successful telephone encounter to Mr. Robert Reilly. [place] to follow up on back pain. Mr. [name] states the pain is still present and worsens with ambulation. He described the pain as "tightness in the middle of back". He contacted his PCP this morning and awaiting call back. He denies cardiac complaints and feels this pain is "from my kidneys". This pain does not deter him from exercise and states he walked for 45 min this morning. Reinforced s/s of cardiac emergencies. Patient verbalized understanding.   Plan: Will follow up with patient on Friday  Cara Thaxton E. Rollene Rotunda RN, BSN [place]. [facility]  Cardiac and Pulmonary Rehabilitation Phone: [phone] Fax: [phone]

## 2020-01-22 ENCOUNTER — Other Ambulatory Visit: Payer: Self-pay | Admitting: Nurse Practitioner

## 2020-01-22 DIAGNOSIS — M546 Pain in thoracic spine: Secondary | ICD-10-CM | POA: Diagnosis not present

## 2020-01-23 ENCOUNTER — Other Ambulatory Visit: Payer: Self-pay

## 2020-01-23 ENCOUNTER — Encounter (HOSPITAL_COMMUNITY)
Admission: RE | Admit: 2020-01-23 | Discharge: 2020-01-23 | Disposition: A | Discharge status: Home / Self Care | Payer: Self-pay | Source: Ambulatory Visit | Attending: Cardiology | Admitting: Cardiology

## 2020-01-23 DIAGNOSIS — Z955 Presence of coronary angioplasty implant and graft: Secondary | ICD-10-CM

## 2020-01-23 NOTE — Progress Notes (Signed)
Virtual Cardiac Rehab Note:  Sucessful telephone encounter to Mr. Eli Hose. [place] to follow up on recent complaint of back pain that worsens with activity. Mr. Danbury previously described this pain as bilateral mid back pain that radiates to the mid spine. This pain has been ongoing for approximately 3 weeks and first occurred after strenuous yard work. The pain is worse first thing in the morning upon getting out of bed.   Mr. Feick discussed this with his primary care doctor yesterday. Cardiovascular causes were ruled out and it is assumed this pain is musculoskeletal. Mr. Patrice was instructed to utilize heat at the site of pain. Patient utilized heating pad during the night. Described significant pain getting out of bed however pain subsided after stretching. He is currently walking for exercise during  This call and pain has not returned. Mr. Burnet is encouraged to continue utilizing upper body and torso stretches taught in CR. He is instructed to stretch upon getting out of bed and again prior nighttime bedrest. Mr. Reichman has utilized [employer] in the past and is encouraged to utilize for back pain if needed. He is instructed to minimize lifting or twisting for a few days. Patient verbalizes understanding.  Plan: Will continue to follow symptoms with exercise via VCR app  Dejane Scheibe E. Rollene Rotunda RN, BSN [place]. [facility]  Cardiac and Pulmonary Rehabilitation Phone: [phone] Fax: [phone]

## 2020-01-26 ENCOUNTER — Telehealth: Payer: Self-pay | Admitting: *Deleted

## 2020-01-26 NOTE — Telephone Encounter (Signed)
Pt calling in today due before pt tried to get out of bed has pain between below shoulder blades that radiates to front of rib cages.  After moving goes away except when pt does home cardiac rehab walking 3 miles daily.  When pt walks and get tired the pain in the shoulder blades and radiates to rib cages comes back.  Pt called and s/w someone does not know what office and stated to Go to Dr. Virgilio Belling office.  Pt went for appt do Dr. Virgilio Belling and stated nothing wrong with pt's lungs or heart, maybe pulled a muscle.  Pt stated Friday at 5 bp 66/39 pt was dizzy and laid down.  Denies pre-syncope, nausea, SOB.  Was told to hold meds Friday and Saturday and restart Sunday.  Pt getting ready to walk and take am meds,  told pt to hold off till Cecille Rubin is advised.   Did change altace to one tablet ( 10 mg ) daily. Today's bp is 133/72 and pt does not have any shoulder pain today.  Will send to Cecille Rubin to advise.

## 2020-01-26 NOTE — Progress Notes (Signed)
CARDIOLOGY OFFICE NOTE  Date:  01/28/2020    Robert Reilly Date of Birth: 1941/01/14 Medical Record [id number]  PCP:  Alroy Dust, L.Marlou Sa, MD  Cardiologist:  Servando Snare [facility]   Chief Complaint  Patient presents with  . Follow-up    History of Present Illness: Robert Reilly is a 79 y.o. male who presents today for a work in visit. Seen for Dr. Marlou Porch but primarily follows with me.   He has a history ofCAD with MI in 2004, with stent to RCA.Other issues include HTN, HLD and RA.He has had some situational stress with being in the caregiver role for his wife.  Just seen in mid December - elevators were brokenhereand he walked up all 3 flights of stairs without issue.He wasfelt to bedoing ok- did advise him to stop his Mobic.  He then presented 2 weeks later with severe heartburn and chest pain - initially improved - then returned while eating spaghetti - took 4 baby aspirin and came to the ER. Admitted for cardiac cath. Found to have single vessel CAD of the mRCA with focal lesion above the prior RCA stent. On DAPT for 6 months but likely one year total. Echo with normal EF and G1DD.  I then saw him 2 weeks ago shortly after his discharge due to chest pain . Added low dose Imdur and increased his PPI. Diet had been poor - had been eating hot dogsat [employer].On return he was doing better but symptoms not totally resolved - he was concerned about possible pancreatitis - and had stopped his alcohol. Last seen last month - he was doing ok - had had both COVID vaccines. No more chest pain. Wanting to cut back medicines. Felt to be stable.   Called earlier this week - several concerns - could not discern over the phone and asked for him to come in for discussion/assessment.   The patient does not have symptoms concerning for COVID-19 infection (fever, chills, cough, or new shortness of breath).   Comes in today. Here alone. He notes that every morning he wakes up with a  bilateral back pain. It will move up his back sometimes if he gets really tired. This is a new sensation since his stent. It is not similar to his presentation that led to hospitalization and stent. He also has some neck pain. What is bothering him most is all the medicines. He notes that with pushing on it will actually help. When he walks, this sensation will move up the back - this is dull - nothing like his prior chest pain syndrome. He has noted lower BP readings - especially after exercising. He walks 3 miles a day. He fished all day yesterday and noted that his back  Past Medical History:  Diagnosis Date  . Acute myocardial infarction of other inferior wall, subsequent episode of care   . Cancer ([place])    melenoma  back , left arm   4-5 yrs ago  . Coronary atherosclerosis of native coronary artery   . GERD (gastroesophageal reflux disease)   . Gout   . History of hiatal hernia   . Hypercholesteremia   . Hypertension   . Old MI (myocardial infarction) 09/10/2013   2004, stent to RCA  . Osteoarthritis   . Other disorder of calcium metabolism   . Tick bite 05/25/2016   states has had 5 in past month    Past Surgical History:  Procedure Laterality Date  . CARDIAC CATHETERIZATION    .  CORONARY ANGIOPLASTY     stent x 1    12-15 yrs ago  . CORONARY STENT INTERVENTION N/A 10/28/2019   Procedure: CORONARY STENT INTERVENTION;  Surgeon: Leonie Man, MD;  Location: [place] [facility];  Service: Cardiovascular;  Laterality: N/A;  . EYE SURGERY Bilateral    cataracts  . INSERTION OF MESH N/A 05/30/2016   Procedure: INSERTION OF MESH;  Surgeon: Jackolyn Confer, MD;  Location: [facility];  Service: General;  Laterality: N/A;  . LEFT HEART CATH AND CORONARY ANGIOGRAPHY N/A 10/28/2019   Procedure: LEFT HEART CATH AND CORONARY ANGIOGRAPHY;  Surgeon: Leonie Man, MD;  Location: [place] [facility];  Service: Cardiovascular;  Laterality: N/A;  . MELANOMA EXCISION     back, left arm  .  SHOULDER ARTHROSCOPY WITH ROTATOR CUFF REPAIR Left   . UMBILICAL HERNIA REPAIR N/A 05/30/2016   Procedure: UMBILICAL HERNIA REPAIR;  Surgeon: Jackolyn Confer, MD;  Location: [facility];  Service: General;  Laterality: N/A;     Medications: Current Meds  Medication Sig  . allopurinol (ZYLOPRIM) 100 MG tablet Take 100 mg by mouth 2 (two) times daily.   Marland Kitchen amLODipine (NORVASC) 5 MG tablet Take 5 mg by mouth at bedtime.  . Ascorbic Acid (VITAMIN C) 100 MG tablet Take 100 mg by mouth daily.  Marland Kitchen aspirin EC 81 MG tablet Take 81 mg by mouth daily.  . calcium-vitamin D (OSCAL WITH D) 500-200 MG-UNIT tablet Take 1 tablet by mouth 2 (two) times daily.  . cholecalciferol (VITAMIN D3) 25 MCG (1000 UNIT) tablet Take 1,000 Units by mouth daily.  . colchicine 0.6 MG tablet Take 0.6 mg by mouth daily.   . Flaxseed, Linseed, (FLAX SEED OIL) 1000 MG CAPS Take 1,000 mg by mouth 2 (two) times daily.   . hydrochlorothiazide (HYDRODIURIL) 25 MG tablet TAKE 1 TABLET BY MOUTH DAILY.  . isosorbide mononitrate (IMDUR) 30 MG 24 hr tablet Take 0.5 tablets (15 mg total) by mouth daily.  . metoprolol succinate (TOPROL-XL) 50 MG 24 hr tablet Take 25 mg by mouth daily. Take with or immediately following a meal.   . Multiple Vitamins-Minerals (AIRBORNE GUMMIES PO) Take 2 tablets by mouth daily.  . nitroGLYCERIN (NITROSTAT) 0.4 MG SL tablet Place 1 tablet (0.4 mg total) under the tongue every 5 (five) minutes as needed for chest pain.  Marland Kitchen omeprazole (PRILOSEC) 20 MG capsule Take 1 capsule (20 mg total) by mouth 2 (two) times daily before a meal.  . ramipril (ALTACE) 10 MG capsule Take 1 capsule (10 mg total) by mouth daily.  . rosuvastatin (CRESTOR) 20 MG tablet Take 20 mg by mouth at bedtime.  . solifenacin (VESICARE) 5 MG tablet Take 5 mg by mouth daily.  . ticagrelor (BRILINTA) 90 MG TABS tablet Take 1 tablet (90 mg total) by mouth 2 (two) times daily.     Allergies: Allergies  Allergen Reactions  . Tetracyclines & Related  Rash  . Other Other (See Comments)    No meat: Came from a tick bite     Social History: The patient  reports that he quit smoking about 18 years ago. His smoking use included cigarettes. He has quit using smokeless tobacco. He reports current alcohol use. He reports that he does not use drugs.   Family History: The patient's family history includes Cancer in his father; Stroke in his mother.   Review of Systems: Please see the history of present illness.   All other systems are reviewed and negative.  Physical Exam: VS:  BP 110/70   Pulse 65   Ht 5' 7.5" (1.715 m)   Wt 179 lb 12.8 oz (81.6 kg)   SpO2 97%   BMI 27.75 kg/m  .  BMI Body mass index is 27.75 kg/m.  Wt Readings from Last 3 Encounters:  01/28/20 179 lb 12.8 oz (81.6 kg)  01/13/20 181 lb 3.5 oz (82.2 kg)  12/24/19 182 lb 12.8 oz (82.9 kg)    General: Pleasant. Well developed, well nourished and in no acute distress.   HEENT: Normal.  Neck: Supple, no JVD, carotid bruits, or masses noted.  Cardiac: Regular rate and rhythm. No murmurs, rubs, or gallops. No edema.  Respiratory:  Lungs are clear to auscultation bilaterally with normal work of breathing.  GI: Soft and nontender.  MS: No deformity or atrophy. Gait and ROM intact.  Skin: Warm and dry. Color is normal.  Neuro:  Strength and sensation are intact and no gross focal deficits noted.  Psych: Alert, appropriate and with normal affect.   LABORATORY DATA:  EKG:  EKG is ordered today.  Personally reviewed by me. This demonstrates NSR - stable tracing.  Lab Results  Component Value Date   WBC 7.1 11/17/2019   HGB 15.4 11/17/2019   HCT 43.5 11/17/2019   PLT 250 11/17/2019   GLUCOSE 102 (H) 11/17/2019   CHOL 124 10/14/2019   TRIG 137 10/14/2019   HDL 37 (L) 10/14/2019   LDLDIRECT 76.0 03/15/2015   LDLCALC 63 10/14/2019   ALT 25 10/28/2019   AST 25 10/28/2019   NA 143 11/17/2019   K 3.9 11/17/2019   CL 102 11/17/2019   CREATININE 1.38 (H)  11/17/2019   BUN 25 11/17/2019   CO2 27 11/17/2019   TSH 4.601 (H) 10/28/2019   HGBA1C 5.9 (H) 10/28/2019     BNP (last 3 results) No results for input(s): BNP in the last 8760 hours.  ProBNP (last 3 results) No results for input(s): PROBNP in the last 8760 hours.   Other Studies Reviewed Today:  Cath: [id number]   LV end diastolic pressure is mildly elevated.  The left ventricular systolic function is normal.  There is no aortic valve stenosis.  CULPRIT LESION: Mid RCA lesion is 90% stenosed.  A drug-eluting stent was successfully placed using a STENT RESOLUTE ONYX 3.0X12. - post-dilated to 3.35 mm  Post intervention, there is a 0% residual stenosis.  Previously placed Mid RCA to Dist RCA stent (DES) is widely patent.  Separate [place] for LAD & LCx  SUMMARY  Severe Single Vessel CAD - mRCA 90% focal lesion upstream from m-dRCA stent  Successful DES PCI of mRCA (Resolute Onyx DES 3.0 x 12 -> post-dilated to 3.35 mm)  No significant disease in LAD & LCx systems - each wtih separate ostia.  Likely preserved LVEF (inadequate LV Gram to assess wall motion) with mildly elevated LVEDP   RECOMMENDATIONS  Overnight admission post PCI  TR Band removal per protocol  Continue to titrate IV NTG overnight for BP control  ASA/Brilinta DAPT x min 6 months uninterrupted (ok to interrupt after 6 months)  Ensure statin & Beta Blocker (pending HR monitoring overnight)   [name], MD, MS    TTE: 10/28/19 IMPRESSIONS  1. Left ventricular ejection fraction, by visual estimation, is 55 to 60%. The left ventricle has normal function. Left ventricular septal wall thickness was mildly increased. Mildly increased left ventricular posterior wall thickness. There is mildly  increased left ventricular hypertrophy. 2. Left ventricular  diastolic parameters are consistent with Grade I diastolic dysfunction (impaired relaxation). 3. The left ventricle has no  regional wall motion abnormalities. 4. Global right ventricle has normal systolic function.The right ventricular size is normal. No increase in right ventricular wall thickness. 5. Left atrial size was normal. 6. Right atrial size was normal. 7. The mitral valve is normal in structure. Trivial mitral valve regurgitation. No evidence of mitral stenosis. 8. The tricuspid valve is normal in structure. 9. The aortic valve is tricuspid. Aortic valve regurgitation is not visualized. No evidence of aortic valve sclerosis or stenosis. 10. The pulmonic valve was normal in structure. Pulmonic valve regurgitation is not visualized. 11. TR signal is inadequate for assessing pulmonary artery systolic pressure. 12. The inferior vena cava is normal in size with <50% respiratory variability, suggesting right atrial pressure of 8 mmHg.    Assessment/Plan:  1. Back pain - this sounds like musculoskeletal to me - nothing like prior chest pain syndrome - ok to use Tylenol - would favor stretching as well.   2. Low BP - he has lost some weight - endorses some fatigue - will try cutting Metoprolol back to 25 mg   3. CAD - has had prior PCI to the RCA for single vessel disease - remains on DAPT. He had some chest pain/indigestion follow this - improved with addition of PPI and nitrate therapy.   4. GERD - on PPI  5. HTN - BP little low - cutting Toprol back today.   6. HLD - on statin  7. COVID-19 Education: The signs and symptoms of COVID-19 were discussed with the patient and how to seek care for testing (follow up with PCP or arrange E-visit).  The importance of social distancing, staying at home, hand hygiene and wearing a mask when out in public were discussed today.  Current medicines are reviewed with the patient today.  The patient does not have concerns regarding medicines other than what has been noted above.  The following changes have been made:  See above.  Labs/ tests ordered today  include:    Orders Placed This Encounter  Procedures  . EKG 12-Lead     Disposition:   FU with me in as planned in May.     Patient is agreeable to this plan and will call if any problems develop in the interim.   SignedTruitt Merle, NP  01/28/2020 2:53 PM  [facility] [address] [place], [place]  [address] Phone: [phone] Fax: [phone]

## 2020-01-26 NOTE — Telephone Encounter (Signed)
I can't sort this out over the phone - I think he needs to come back and be seen so we can come up with a better plan.   Ok to walk today - if he has recurrent pain - stop - use NTG as needed.   Cecille Rubin

## 2020-01-26 NOTE — Telephone Encounter (Signed)
Pt is aware of Lori's recommendation's.  Pt was made appt for Wednesday, March 31 with Cecille Rubin.

## 2020-01-26 NOTE — Telephone Encounter (Signed)
Pt calling in today due before pt tried to get out of bed has pain between below shoulder blades that radiates to front of rib cages.  After moving goes away except when pt does home cardiac rehab walking 3 miles daily.  When pt walks and get tired the pain in the shoulder blades and radiates to rib cages comes back.  Pt called and s/w someone does not know what office and stated to Go to Dr. Virgilio Belling office.  Pt went for appt do Dr. Virgilio Belling and stated nothing wrong with pt's lungs or heart, maybe pulled a muscle.  Pt stated Friday at 5 bp 66/39 pt was dizzy and laid down.  Denies pre-syncope, nausea, SOB.  Was told to hold meds Friday and Saturday and restart Sunday.  Pt getting ready to walk and take am meds,  told pt to hold off till Cecille Rubin is advised.   Did change altace Today's bp is 133/72 and pt does not have any shoulder pain.

## 2020-01-27 ENCOUNTER — Other Ambulatory Visit: Payer: Self-pay

## 2020-01-27 ENCOUNTER — Encounter (HOSPITAL_COMMUNITY)
Admission: RE | Admit: 2020-01-27 | Discharge: 2020-01-27 | Disposition: A | Discharge status: Home / Self Care | Payer: BC Managed Care – PPO | Source: Ambulatory Visit | Attending: Cardiology | Admitting: Cardiology

## 2020-01-27 NOTE — Progress Notes (Signed)
Nutrition Note  Successful phone encounter with pt. He was scheduled for a Virtual Cardiac Rehab visit for his initial nutrition assessment today.He was on his way to go fishing and asked to reschedule his visit. Scheduled for April 2 at 2:30 pm. Instructed pt that this will be a telephone visit.  Michaele Offer, MS, RDN, LDN

## 2020-01-28 ENCOUNTER — Encounter: Payer: Self-pay | Admitting: Nurse Practitioner

## 2020-01-28 ENCOUNTER — Ambulatory Visit: Payer: BC Managed Care – PPO | Admitting: Nurse Practitioner

## 2020-01-28 ENCOUNTER — Other Ambulatory Visit: Payer: Self-pay

## 2020-01-28 VITALS — BP 110/70 | HR 65 | Ht 67.5 in | Wt 179.8 lb

## 2020-01-28 DIAGNOSIS — E7800 Pure hypercholesterolemia, unspecified: Secondary | ICD-10-CM

## 2020-01-28 DIAGNOSIS — Z955 Presence of coronary angioplasty implant and graft: Secondary | ICD-10-CM

## 2020-01-28 DIAGNOSIS — K219 Gastro-esophageal reflux disease without esophagitis: Secondary | ICD-10-CM | POA: Diagnosis not present

## 2020-01-28 DIAGNOSIS — I10 Essential (primary) hypertension: Secondary | ICD-10-CM | POA: Diagnosis not present

## 2020-01-28 DIAGNOSIS — Z7189 Other specified counseling: Secondary | ICD-10-CM

## 2020-01-28 NOTE — Patient Instructions (Signed)
After Visit Summary:  We will be checking the following labs today - NONE   Medication Instructions:    Continue with your current medicines. BUT  I am cutting the Metoprolol back in half - 25 mg     If you need a refill on your cardiac medications before your next appointment, please call your pharmacy.     Testing/Procedures To Be Arranged:  N/A  Follow-Up:   See me as planned next month.     At [facility], you and your health needs are our priority.  As part of our continuing mission to provide you with exceptional heart care, we have created designated Provider Care Teams.  These Care Teams include your primary Cardiologist (physician) and Advanced Practice Providers (APPs -  Physician Assistants and Nurse Practitioners) who all work together to provide you with the care you need, when you need it.  Special Instructions:  . Stay safe, stay home, wash your hands for at least 20 seconds and wear a mask when out in public.  . It was good to talk with you today.    Call the [place] office at [phone] if you have any questions, problems or concerns.

## 2020-01-30 ENCOUNTER — Encounter (HOSPITAL_COMMUNITY)
Admission: RE | Admit: 2020-01-30 | Discharge: 2020-01-30 | Disposition: A | Discharge status: Home / Self Care | Payer: Self-pay | Source: Ambulatory Visit | Attending: Cardiology | Admitting: Cardiology

## 2020-01-30 ENCOUNTER — Other Ambulatory Visit: Payer: Self-pay

## 2020-01-30 DIAGNOSIS — Z955 Presence of coronary angioplasty implant and graft: Secondary | ICD-10-CM | POA: Insufficient documentation

## 2020-01-30 NOTE — Progress Notes (Signed)
Robert Reilly 79 y.o. male Nutrition Note  Visit Diagnosis: S/P DES mRCA 10/28/19  Past Medical History:  Diagnosis Date  . Acute myocardial infarction of other inferior wall, subsequent episode of care   . Cancer ([place])    melenoma  back , left arm   4-5 yrs ago  . Coronary atherosclerosis of native coronary artery   . GERD (gastroesophageal reflux disease)   . Gout   . History of hiatal hernia   . Hypercholesteremia   . Hypertension   . Old MI (myocardial infarction) 09/10/2013   2004, stent to RCA  . Osteoarthritis   . Other disorder of calcium metabolism   . Tick bite 05/25/2016   states has had 5 in past month     Medications reviewed.   Current Outpatient Medications:  .  allopurinol (ZYLOPRIM) 100 MG tablet, Take 100 mg by mouth 2 (two) times daily. , Disp: , Rfl: 1 .  amLODipine (NORVASC) 5 MG tablet, Take 5 mg by mouth at bedtime., Disp: , Rfl:  .  Ascorbic Acid (VITAMIN C) 100 MG tablet, Take 100 mg by mouth daily., Disp: , Rfl:  .  aspirin EC 81 MG tablet, Take 81 mg by mouth daily., Disp: , Rfl:  .  calcium-vitamin D (OSCAL WITH D) 500-200 MG-UNIT tablet, Take 1 tablet by mouth 2 (two) times daily., Disp: , Rfl:  .  cholecalciferol (VITAMIN D3) 25 MCG (1000 UNIT) tablet, Take 1,000 Units by mouth daily., Disp: , Rfl:  .  colchicine 0.6 MG tablet, Take 0.6 mg by mouth daily. , Disp: , Rfl:  .  Flaxseed, Linseed, (FLAX SEED OIL) 1000 MG CAPS, Take 1,000 mg by mouth 2 (two) times daily. , Disp: , Rfl:  .  hydrochlorothiazide (HYDRODIURIL) 25 MG tablet, TAKE 1 TABLET BY MOUTH DAILY., Disp: 90 tablet, Rfl: 3 .  isosorbide mononitrate (IMDUR) 30 MG 24 hr tablet, Take 0.5 tablets (15 mg total) by mouth daily., Disp: 30 tablet, Rfl: 3 .  metoprolol succinate (TOPROL-XL) 50 MG 24 hr tablet, Take 25 mg by mouth daily. Take with or immediately following a meal. , Disp: , Rfl:  .  Multiple Vitamins-Minerals (AIRBORNE GUMMIES PO), Take 2 tablets by mouth daily., Disp: , Rfl:  .   nitroGLYCERIN (NITROSTAT) 0.4 MG SL tablet, Place 1 tablet (0.4 mg total) under the tongue every 5 (five) minutes as needed for chest pain., Disp: 25 tablet, Rfl: 1 .  omeprazole (PRILOSEC) 20 MG capsule, Take 1 capsule (20 mg total) by mouth 2 (two) times daily before a meal., Disp: 60 capsule, Rfl: 6 .  ramipril (ALTACE) 10 MG capsule, Take 1 capsule (10 mg total) by mouth daily., Disp: 90 capsule, Rfl: 3 .  rosuvastatin (CRESTOR) 20 MG tablet, Take 20 mg by mouth at bedtime., Disp: , Rfl:  .  solifenacin (VESICARE) 5 MG tablet, Take 5 mg by mouth daily., Disp: , Rfl:  .  ticagrelor (BRILINTA) 90 MG TABS tablet, Take 1 tablet (90 mg total) by mouth 2 (two) times daily., Disp: 180 tablet, Rfl: 2   Ht Readings from Last 1 Encounters:  01/28/20 5' 7.5" (1.715 m)     Wt Readings from Last 3 Encounters:  01/28/20 179 lb 12.8 oz (81.6 kg)  01/13/20 181 lb 3.5 oz (82.2 kg)  12/24/19 182 lb 12.8 oz (82.9 kg)     There is no height or weight on file to calculate BMI.   Social History   Tobacco Use  Smoking Status  Former Smoker  . Types: Cigarettes  . Quit date: 05/25/2001  . Years since quitting: 18.6  Smokeless Tobacco Former Building surveyor Value Date   CHOL 124 10/14/2019   Lab Results  Component Value Date   HDL 37 (L) 10/14/2019   Lab Results  Component Value Date   LDLCALC 63 10/14/2019   Lab Results  Component Value Date   TRIG 137 10/14/2019     Lab Results  Component Value Date   HGBA1C 5.9 (H) 10/28/2019     CBG (last 3)  No results for input(s): GLUCAP in the last 72 hours.   Nutrition Note  Spoke with pt. Nutrition Plan and Nutrition Survey goals reviewed with pt. Pt diet recall reveals some room for improvement with diet. He has been through weight watchers and lost 40 lbs previously. He feels he has the knowledge to make these changes but lacks the motivation. We reviewed ways to limit sodium and saturated fat. Encouraged pt to cook at  home more often.   Per discussion, pt does use canned/convenience foods often. Pt does add salt to food. Pt does eat out frequently.   Pt expressed understanding of the information reviewed.   Nutrition Diagnosis ? Excessive sodium intake related to overconsumption of highly processed and restaurant foods as evidenced by diet recall and diagnosis of HTN  Nutrition Intervention ? Pt's individual nutrition plan reviewed with pt. ? Benefits of adopting Heart Healthy diet discussed when Medficts reviewed.   ? Pt given handouts for: ? Nutrition I class ? Our Plate ? Continue client-centered nutrition education by RD, as part of interdisciplinary care.  Goal(s) ? Pt to make healthier choices when eating out  ? Pt to build a healthy plate including vegetables, fruits, whole grains, and low-fat dairy products in a heart healthy meal plan.  Plan:   Will provide client-centered nutrition education as part of interdisciplinary care  Monitor and evaluate progress toward nutrition goal with team.   Michaele Offer, MS, RDN, LDN

## 2020-02-10 DIAGNOSIS — L821 Other seborrheic keratosis: Secondary | ICD-10-CM | POA: Diagnosis not present

## 2020-02-10 DIAGNOSIS — L853 Xerosis cutis: Secondary | ICD-10-CM | POA: Diagnosis not present

## 2020-02-10 DIAGNOSIS — D485 Neoplasm of uncertain behavior of skin: Secondary | ICD-10-CM | POA: Diagnosis not present

## 2020-02-10 DIAGNOSIS — Z85828 Personal history of other malignant neoplasm of skin: Secondary | ICD-10-CM | POA: Diagnosis not present

## 2020-02-10 DIAGNOSIS — D2261 Melanocytic nevi of right upper limb, including shoulder: Secondary | ICD-10-CM | POA: Diagnosis not present

## 2020-02-10 DIAGNOSIS — Z85820 Personal history of malignant melanoma of skin: Secondary | ICD-10-CM | POA: Diagnosis not present

## 2020-02-18 ENCOUNTER — Telehealth: Payer: Self-pay

## 2020-02-18 ENCOUNTER — Other Ambulatory Visit: Payer: Self-pay | Admitting: *Deleted

## 2020-02-18 MED ORDER — METOPROLOL SUCCINATE ER 25 MG PO TB24: 25.0000 mg | ORAL_TABLET | Freq: Every day | ORAL | 3 refills | Status: DC

## 2020-02-18 NOTE — Telephone Encounter (Signed)
[facility] stating that pt would like a refill for Metoprolol 25 mg tablet, instead of Metoprolol 50 mg tablet, because pt only takes 1/2 tablet of metoprolol 50 mg tablets. Would [name], NP like to prescribe this metoprolol 25 mg tablet for the pt? Please address

## 2020-02-18 NOTE — Progress Notes (Signed)
CARDIOLOGY OFFICE NOTE  Date:  03/03/2020    Robert Reilly Date of Birth: Jan 03, 1941 Medical Record [id number]  PCP:  Robert Reilly, L.Robert Sa, MD  Cardiologist:  Robert Reilly Robert Reilly  Chief Complaint  Patient presents with  . Follow-up    History of Present Illness: Robert Reilly is a 79 y.o. male who presents today for a 6 week check. Seen for Dr. Marlou Porch but primarily follows with me.   He has a history ofCAD with MI in 2004, with stent to RCA.Other issues include HTN, HLD and RA.He has had some situational stress with being in the caregiver role for his wife.  Just seen in mid December - elevators were brokenhereand he walked up all 3 flights of stairs without issue.He wasfelt to bedoing ok- did advise him to stop his Mobic.  He then presented 2 weeks later with severe heartburn and chest pain - initially improved - then returned while eating spaghetti - took 4 baby aspirin and came to the ER. Admitted for cardiac cath. Found to have single vessel CAD of the mRCA with focal lesion above the prior RCA stent. On DAPT for 6 months but likely one year total. Echo with normal EF and G1DD.  Ithensaw him 2 weeks ago shortly after his discharge due to chest pain . Added low dose Imdur and increased his PPI. Diet had been poor - had been eating hot dogsat [employer].On return he was doing better but symptoms not totally resolved - he was concerned about possible pancreatitis - and had stopped his alcohol.Last seen last month - he was doing ok - had had both COVID vaccines. No more chest pain. Wanting to cut back medicines. Felt to be stable.   We saw him for a work in several weeks ago - he had several concerns - waking up with bilateral back pain - some fatigue - nothing like his presentation with his stent. Walking 3 miles a day. I did cut his Metoprolol back.   The patient does not have symptoms concerning for COVID-19 infection (fever, chills, cough, or new shortness of  breath).   Comes in today. Here alone. He says he is doing well. Feels better on less Toprol. He is doing the virtual rehab and enjoying this and feels like he is doing better. He is walking most days and playing a golf and also walking with his guide hunting. BP is good at home. HR is running in the 60's. Asking about stopping Imdur. He is not having chest pain. He is concerned about the number of medicines he is taking. Overall, he feels like he is doing well.   Past Medical History:  Diagnosis Date  . Acute myocardial infarction of other inferior wall, subsequent episode of care   . Cancer ([place])    melenoma  back , left arm   4-5 yrs ago  . Coronary atherosclerosis of native coronary artery   . GERD (gastroesophageal reflux disease)   . Gout   . History of hiatal hernia   . Hypercholesteremia   . Hypertension   . Old MI (myocardial infarction) 09/10/2013   2004, stent to RCA  . Osteoarthritis   . Other disorder of calcium metabolism   . Tick bite 05/25/2016   states has had 5 in past month    Past Surgical History:  Procedure Laterality Date  . CARDIAC CATHETERIZATION    . CORONARY ANGIOPLASTY     stent x 1    12-15 yrs  ago  . CORONARY STENT INTERVENTION N/A 10/28/2019   Procedure: CORONARY STENT INTERVENTION;  Surgeon: Robert Man, MD;  Location: [facility];  Service: Cardiovascular;  Laterality: N/A;  . EYE SURGERY Bilateral    cataracts  . INSERTION OF MESH N/A 05/30/2016   Procedure: INSERTION OF MESH;  Surgeon: Robert Confer, MD;  Location: [facility];  Service: General;  Laterality: N/A;  . LEFT HEART CATH AND CORONARY ANGIOGRAPHY N/A 10/28/2019   Procedure: LEFT HEART CATH AND CORONARY ANGIOGRAPHY;  Surgeon: Robert Man, MD;  Location: [facility];  Service: Cardiovascular;  Laterality: N/A;  . MELANOMA EXCISION     back, left arm  . SHOULDER ARTHROSCOPY WITH ROTATOR CUFF REPAIR Left   . UMBILICAL HERNIA REPAIR N/A 05/30/2016   Procedure: UMBILICAL  HERNIA REPAIR;  Surgeon: Robert Confer, MD;  Location: [facility];  Service: General;  Laterality: N/A;     Medications: Current Meds  Medication Sig  . allopurinol (ZYLOPRIM) 100 MG tablet Take 100 mg by mouth 2 (two) times daily.   Marland Kitchen amLODipine (NORVASC) 5 MG tablet Take 5 mg by mouth at bedtime.  . Ascorbic Acid (VITAMIN C) 100 MG tablet Take 100 mg by mouth daily.  Marland Kitchen aspirin EC 81 MG tablet Take 81 mg by mouth daily.  . calcium-vitamin D (OSCAL WITH D) 500-200 MG-UNIT tablet Take 1 tablet by mouth 2 (two) times daily.  . cholecalciferol (VITAMIN D3) 25 MCG (1000 UNIT) tablet Take 1,000 Units by mouth daily.  . colchicine 0.6 MG tablet Take 0.6 mg by mouth daily.   . Flaxseed, Linseed, (FLAX SEED OIL) 1000 MG CAPS Take 1,000 mg by mouth 2 (two) times daily.   . hydrochlorothiazide (HYDRODIURIL) 25 MG tablet TAKE 1 TABLET BY MOUTH DAILY.  . metoprolol succinate (TOPROL-XL) 25 MG 24 hr tablet Take 1 tablet (25 mg total) by mouth daily. Take with or immediately following a meal.  . Multiple Vitamins-Minerals (AIRBORNE GUMMIES PO) Take 2 tablets by mouth daily.  . nitroGLYCERIN (NITROSTAT) 0.4 MG SL tablet Place 1 tablet (0.4 mg total) under the tongue every 5 (five) minutes as needed for chest pain.  Marland Kitchen omeprazole (PRILOSEC) 20 MG capsule Take 1 capsule (20 mg total) by mouth 2 (two) times daily before a meal.  . ramipril (ALTACE) 10 MG capsule Take 1 capsule (10 mg total) by mouth daily.  . rosuvastatin (CRESTOR) 20 MG tablet Take 20 mg by mouth at bedtime.  . solifenacin (VESICARE) 5 MG tablet Take 5 mg by mouth daily.  . ticagrelor (BRILINTA) 90 MG TABS tablet Take 1 tablet (90 mg total) by mouth 2 (two) times daily.  . [DISCONTINUED] isosorbide mononitrate (IMDUR) 30 MG 24 hr tablet Take 0.5 tablets (15 mg total) by mouth daily.     Allergies: Allergies  Allergen Reactions  . Tetracyclines & Related Rash  . Other Other (See Comments)    No meat: Came from a tick bite     Social  History: The patient  reports that he quit smoking about 18 years ago. His smoking use included cigarettes. He has quit using smokeless tobacco. He reports current alcohol use. He reports that he does not use drugs.   Family History: The patient's family history includes Cancer in his father; Stroke in his mother.   Review of Systems: Please see the history of present illness.   All other systems are reviewed and negative.   Physical Exam: VS:  BP 128/72   Pulse 60  Ht 5' 8.5" (1.74 m)   Wt 178 lb (80.7 kg)   BMI 26.67 kg/m  .  BMI Body mass index is 26.67 kg/m.  Wt Readings from Last 3 Encounters:  03/03/20 178 lb (80.7 kg)  01/28/20 179 lb 12.8 oz (81.6 kg)  01/13/20 181 lb 3.5 oz (82.2 kg)    General: Pleasant. Alert and in no acute distress.   Neck: Supple, no JVD, carotid bruits, or masses noted.  Cardiac: Regular rate and rhythm. No murmurs, rubs, or gallops. No edema.  Respiratory:  Lungs are clear to auscultation bilaterally with normal work of breathing.  GI: Soft and nontender.  MS: No deformity or atrophy. Gait and ROM intact.  Skin: Warm and dry. Color is normal.  Neuro:  Strength and sensation are intact and no gross focal deficits noted.  Psych: Alert, appropriate and with normal affect.   LABORATORY DATA:  EKG:  EKG is not ordered today.    Lab Results  Component Value Date   WBC 7.1 11/17/2019   HGB 15.4 11/17/2019   HCT 43.5 11/17/2019   PLT 250 11/17/2019   GLUCOSE 102 (H) 11/17/2019   CHOL 124 10/14/2019   TRIG 137 10/14/2019   HDL 37 (L) 10/14/2019   LDLDIRECT 76.0 03/15/2015   LDLCALC 63 10/14/2019   ALT 25 10/28/2019   AST 25 10/28/2019   NA 143 11/17/2019   K 3.9 11/17/2019   CL 102 11/17/2019   CREATININE 1.38 (H) 11/17/2019   BUN 25 11/17/2019   CO2 27 11/17/2019   TSH 4.601 (H) 10/28/2019   HGBA1C 5.9 (H) 10/28/2019     BNP (last 3 results) No results for input(s): BNP in the last 8760 hours.  ProBNP (last 3 results) No  results for input(s): PROBNP in the last 8760 hours.   Other Studies Reviewed Today:  Cath: [id number]   LV end diastolic pressure is mildly elevated.  The left ventricular systolic function is normal.  There is no aortic valve stenosis.  CULPRIT LESION: Mid RCA lesion is 90% stenosed.  A drug-eluting stent was successfully placed using a STENT RESOLUTE ONYX 3.0X12. - post-dilated to 3.35 mm  Post intervention, there is a 0% residual stenosis.  Previously placed Mid RCA to Dist RCA stent (DES) is widely patent.  Separate [place] for LAD & LCx  SUMMARY  Severe Single Vessel CAD - mRCA 90% focal lesion upstream from m-dRCA stent  Successful DES PCI of mRCA (Resolute Onyx DES 3.0 x 12 -> post-dilated to 3.35 mm)  No significant disease in LAD & LCx systems - each wtih separate ostia.  Likely preserved LVEF (inadequate LV Gram to assess wall motion) with mildly elevated LVEDP   RECOMMENDATIONS  Overnight admission post PCI  TR Band removal per protocol  Continue to titrate IV NTG overnight for BP control  ASA/Brilinta DAPT x min 6 months uninterrupted (ok to interrupt after 6 months)  Ensure statin & Beta Blocker (pending HR monitoring overnight)   [name], MD, MS    TTE: 10/28/19 IMPRESSIONS  1. Left ventricular ejection fraction, by visual estimation, is 55 to 60%. The left ventricle has normal function. Left ventricular septal wall thickness was mildly increased. Mildly increased left ventricular posterior wall thickness. There is mildly  increased left ventricular hypertrophy. 2. Left ventricular diastolic parameters are consistent with Grade I diastolic dysfunction (impaired relaxation). 3. The left ventricle has no regional wall motion abnormalities. 4. Global right ventricle has normal systolic function.The right ventricular size is normal.  No increase in right ventricular wall thickness. 5. Left atrial size was normal. 6. Right atrial  size was normal. 7. The mitral valve is normal in structure. Trivial mitral valve regurgitation. No evidence of mitral stenosis. 8. The tricuspid valve is normal in structure. 9. The aortic valve is tricuspid. Aortic valve regurgitation is not visualized. No evidence of aortic valve sclerosis or stenosis. 10. The pulmonic valve was normal in structure. Pulmonic valve regurgitation is not visualized. 11. TR signal is inadequate for assessing pulmonary artery systolic pressure. 12. The inferior vena cava is normal in size with <50% respiratory variability, suggesting right atrial pressure of 8 mmHg.    Assessment/Plan:  1. CAD - prior PCI to RCA for single vessel disease from 09/2019 - remains on DAPT - has had some atypical symptoms post PCI - doing much better on less Toprol. We will stop his Imdur - ok to resume if he feels like he needs to. Continue with CV risk factor modification. Continue with virtual rehab. Continue aspirin/Brilinta/Toprol and Crestor.   2. HTN - Toprol was cut back - BP is fine on his current regimen.   3. GERD - on PPI - not discussed  4. Fatigue - improved with reduction of his beta blocker.   5. HLD - on statin therapy - would continue - he will need fasting labs on return.   6. COVID-19 Education: The signs and symptoms of COVID-19 were discussed with the patient and how to seek care for testing (follow up with PCP or arrange E-visit).  The importance of social distancing, staying at home, hand hygiene and wearing a mask when out in public were discussed today.  Current medicines are reviewed with the patient today.  The patient does not have concerns regarding medicines other than what has been noted above.  The following changes have been made:  See above.  Labs/ tests ordered today include:   No orders of the defined types were placed in this encounter.    Disposition:   FU with me in about 4 months with fasting labs.     Patient is  agreeable to this plan and will call if any problems develop in the interim.   SignedTruitt Merle, NP  03/03/2020 9:02 AM  [facility] [address] [place], [place]  [address] Phone: [phone] Fax: [phone]

## 2020-03-03 ENCOUNTER — Encounter: Payer: Self-pay | Admitting: Nurse Practitioner

## 2020-03-03 ENCOUNTER — Ambulatory Visit: Payer: BC Managed Care – PPO | Admitting: Nurse Practitioner

## 2020-03-03 ENCOUNTER — Other Ambulatory Visit: Payer: Self-pay

## 2020-03-03 VITALS — BP 128/72 | HR 60 | Ht 68.5 in | Wt 178.0 lb

## 2020-03-03 DIAGNOSIS — I10 Essential (primary) hypertension: Secondary | ICD-10-CM | POA: Diagnosis not present

## 2020-03-03 DIAGNOSIS — E7800 Pure hypercholesterolemia, unspecified: Secondary | ICD-10-CM | POA: Diagnosis not present

## 2020-03-03 DIAGNOSIS — K219 Gastro-esophageal reflux disease without esophagitis: Secondary | ICD-10-CM

## 2020-03-03 DIAGNOSIS — Z955 Presence of coronary angioplasty implant and graft: Secondary | ICD-10-CM

## 2020-03-03 DIAGNOSIS — Z7189 Other specified counseling: Secondary | ICD-10-CM

## 2020-03-03 NOTE — Patient Instructions (Addendum)
After Visit Summary:  We will be checking the following labs today - NONE   Medication Instructions:    Continue with your current medicines. BUT  I am stopping the Imdur - you may start back on this if you feel like your chest pain comes back.    If you need a refill on your cardiac medications before your next appointment, please call your pharmacy.     Testing/Procedures To Be Arranged:  N/A  Follow-Up:   See me in about 4 months with fasting labs.     At [facility], you and your health needs are our priority.  As part of our continuing mission to provide you with exceptional heart care, we have created designated Provider Care Teams.  These Care Teams include your primary Cardiologist (physician) and Advanced Practice Providers (APPs -  Physician Assistants and Nurse Practitioners) who all work together to provide you with the care you need, when you need it.  Special Instructions:  . Stay safe, stay home, wash your hands for at least 20 seconds and wear a mask when out in public.  . It was good to talk with you today.    Call the [place] office at [phone] if you have any questions, problems or concerns.

## 2020-03-05 ENCOUNTER — Encounter (HOSPITAL_COMMUNITY)
Admission: RE | Admit: 2020-03-05 | Discharge: 2020-03-05 | Disposition: A | Discharge status: Home / Self Care | Payer: Self-pay | Source: Ambulatory Visit | Attending: Cardiology | Admitting: Cardiology

## 2020-03-05 ENCOUNTER — Other Ambulatory Visit: Payer: Self-pay

## 2020-03-05 DIAGNOSIS — Z955 Presence of coronary angioplasty implant and graft: Secondary | ICD-10-CM | POA: Insufficient documentation

## 2020-03-05 NOTE — Progress Notes (Signed)
Virtual Cardiac Rehab Nutrition Note  Spoke with pt over the phone. He reports walking most days for exercise and logging it into the [employer] App. He feels he has created a habit that will be sustainable. He reports continuing to follow a heart healthy diet. He does not have any questions or concerns about it at this time. He feels comfortable with his progress. No further f/u needed with dietitian for nutrition counseling.   Michaele Offer, MS, RDN, LDN

## 2020-03-11 DIAGNOSIS — D485 Neoplasm of uncertain behavior of skin: Secondary | ICD-10-CM | POA: Diagnosis not present

## 2020-03-11 DIAGNOSIS — D2261 Melanocytic nevi of right upper limb, including shoulder: Secondary | ICD-10-CM | POA: Diagnosis not present

## 2020-03-24 ENCOUNTER — Other Ambulatory Visit: Payer: Self-pay | Admitting: Nurse Practitioner

## 2020-04-05 DIAGNOSIS — H26491 Other secondary cataract, right eye: Secondary | ICD-10-CM | POA: Diagnosis not present

## 2020-04-05 DIAGNOSIS — H02834 Dermatochalasis of left upper eyelid: Secondary | ICD-10-CM | POA: Diagnosis not present

## 2020-04-05 DIAGNOSIS — D3131 Benign neoplasm of right choroid: Secondary | ICD-10-CM | POA: Diagnosis not present

## 2020-04-05 DIAGNOSIS — H02831 Dermatochalasis of right upper eyelid: Secondary | ICD-10-CM | POA: Diagnosis not present

## 2020-04-06 ENCOUNTER — Telehealth (HOSPITAL_COMMUNITY): Payer: Self-pay

## 2020-04-06 NOTE — Telephone Encounter (Signed)
Virtual Cardiac Rehab Note:  Unsuccessful telephone encounter to [employer]. [place] to attempt to schedule a post VCR follow up assessment and 6 min walk test. Unfortunately mailbox is full and can't accept messages at this time.   Plan: Will send secure text message through [employer] VCR app  Stepheni Cameron E. Rollene Rotunda RN, BSN [place]. [facility]  Cardiac and Pulmonary Rehabilitation Phone: [phone] Fax: [phone]

## 2020-04-16 ENCOUNTER — Encounter (HOSPITAL_COMMUNITY): Payer: Self-pay

## 2020-04-16 ENCOUNTER — Telehealth (HOSPITAL_COMMUNITY): Payer: Self-pay | Admitting: *Deleted

## 2020-04-16 DIAGNOSIS — Z955 Presence of coronary angioplasty implant and graft: Secondary | ICD-10-CM

## 2020-04-16 NOTE — Progress Notes (Signed)
Discharge Progress Report  Patient Details  Name: Robert Reilly MRN: [id number] Date of Birth: 1941-05-19 Referring Provider:     CARDIAC REHAB PHASE II ORIENTATION from 01/13/2020 in [facility] [place]  Referring Provider Candee Furbish, MD       Number of Visits: Patient was enrolled in [employer] VCR app 01-13-20 to 04-11-20  Reason for Discharge:  Patient has utilized VCR app for max enrollment time. Unfortunately staff has been unable to contact patient for follow up assessment. Data from [employer] VCR app will be scanned into patients EMR.  Smoking History:  Social History   Tobacco Use  Smoking Status Former Smoker  . Types: Cigarettes  . Quit date: 05/25/2001  . Years since quitting: 18.9  Smokeless Tobacco Former User    Diagnosis:  S/P DES mRCA 10/28/19  ADL UCSD:   Initial Exercise Prescription:   Discharge Exercise Prescription (Final Exercise Prescription Changes):   Functional Capacity:   Psychological, QOL, Others - Outcomes: PHQ 2/9: Depression screen [facility] 2/9 01/13/2020 01/13/2020  Decreased Interest 0 0  Down, Depressed, Hopeless 0 0  PHQ - 2 Score 0 0    Quality of Life:   Personal Goals: Goals established at orientation with interventions provided to work toward goal.    Personal Goals Discharge:   Exercise Goals and Review:   Exercise Goals Re-Evaluation:   Nutrition & Weight - Outcomes:    Nutrition:   Nutrition Discharge:   Education Questionnaire Score:

## 2020-04-20 ENCOUNTER — Ambulatory Visit: Payer: BC Managed Care – PPO | Admitting: Nurse Practitioner

## 2020-04-28 ENCOUNTER — Telehealth: Payer: Self-pay | Admitting: Nurse Practitioner

## 2020-04-28 DIAGNOSIS — R351 Nocturia: Secondary | ICD-10-CM | POA: Diagnosis not present

## 2020-04-28 DIAGNOSIS — R350 Frequency of micturition: Secondary | ICD-10-CM | POA: Diagnosis not present

## 2020-04-28 NOTE — Telephone Encounter (Signed)
Pt c/o BP issue: STAT if pt c/o blurred vision, one-sided weakness or slurred speech  1. What are your last 5 BP readings? 85/59, 95/85, 96/62  2. Are you having any other symptoms (ex. Dizziness, headache, blurred vision, passed out)? Dizziness and funny feeling in his chest  3. What is your BP issue? Patient states he played 18 rounds of golf this morning and felt dizzy while playing and when he got home. He went to his Urologist today as well, in his left arm his BP was 85/59 and right arm was 95/85. When he got home he checked it again and it was 96/62. Patient just wanted to make Robert Reilly aware.

## 2020-04-28 NOTE — Telephone Encounter (Signed)
Attempted to reach patient, but there was no answer and VM was full.

## 2020-04-28 NOTE — Telephone Encounter (Signed)
Will send to triage as BP's are low.

## 2020-04-29 NOTE — Telephone Encounter (Signed)
Patient returning call.

## 2020-04-29 NOTE — Telephone Encounter (Signed)
Agree with recommendations given.   Cecille Rubin

## 2020-04-29 NOTE — Telephone Encounter (Signed)
I spoke with patient. He reports yesterday he played 18 holes of golf.  Did not drink anything while golfing. He felt OK while golfing except a little dizzy at the end when bending over.  He went to urology appointment later that day and BP was 85/58 in the left arm and 95/60 in the right.  He was unable to urinate to provide a urine specimen. He went home and drank 2-3 glasses of water and was able to urinate. He took his normal medicines and BP today is 127/73. He is feeling OK today. I told patient BP issues sounded like he may have been dehydrated and encouraged him to drink water when outside. Patient also reports chest pain on occasion over the last week. He describes as feeling like something is pulling in his chest. Also hurts at night when he rolls over on his side or takes a deep breath.  He did take tylenol which helped and he was able to sleep.  This pain is different than his heart pain.   He also states he has pain at times which is like what he had before he was started on Imdur.  No pain at current time.  I told patient the last office note indicated he could resume Imdur if needed.  He will start Imdur 15 mg daily to see if this helps.  Patient aware to monitor BP and to let us know if it starts running low again.  I told him to also call if chest pain does not improve.

## 2020-04-29 NOTE — Telephone Encounter (Signed)
Attempted to contact patient again but there was no answer and VM still full.

## 2020-05-20 DIAGNOSIS — M25572 Pain in left ankle and joints of left foot: Secondary | ICD-10-CM | POA: Diagnosis not present

## 2020-05-24 DIAGNOSIS — L03032 Cellulitis of left toe: Secondary | ICD-10-CM | POA: Diagnosis not present

## 2020-06-10 DIAGNOSIS — Z1152 Encounter for screening for COVID-19: Secondary | ICD-10-CM | POA: Diagnosis not present

## 2020-06-14 ENCOUNTER — Telehealth: Payer: Self-pay | Admitting: Nurse Practitioner

## 2020-06-14 NOTE — Telephone Encounter (Signed)
   Pt would like to speak with [name], he said it's about his upcoming dental procedure. He would like to know when he can stop his blood thinners. Advise dentist office needs to send clearance request or call us. Pt denied request and ask to speak with Cecille Rubin

## 2020-06-14 NOTE — Telephone Encounter (Signed)
Patient called back returning Danielle's call.  The patient said he had a cap that fell off of a tooth. There is not enough tooth left to put the cap back on so the dentist will need to take what is left of the tooth out and put an implant in.  The dentist is just concerned about the dental work because the patient is on blood thinners. The patient said the dentist will contact [name] in a few days as well

## 2020-06-14 NOTE — Telephone Encounter (Signed)
lvm to get more details on what dental procedure pt is referring to.

## 2020-06-14 NOTE — Telephone Encounter (Signed)
S/w pt has appointment with Cecille Rubin on Sept 1 will have further discussion on dental procedure than.  Pt will bring name and phone number of dentist.

## 2020-06-16 ENCOUNTER — Telehealth: Payer: Self-pay | Admitting: Cardiology

## 2020-06-16 NOTE — Telephone Encounter (Signed)
Called the requesting office and spoke with Margarita Grizzle and she stated that the patient is having 1 tooth extracted.

## 2020-06-16 NOTE — Telephone Encounter (Signed)
1. What dental office are you calling from? [facility]   2. What is your office phone number? [phone]  3. What is your fax number? [phone]  4. What type of procedure is the patient having performed? Extraction and possible implant with bone grafting and sutures   5. What date is procedure scheduled or is the patient there now? TBD (if the patient is at the dentist's office question goes to their cardiologist if he/she is in the office.  If not, question should go to the DOD).   6. What is your question (ex. Antibiotics prior to procedure, holding medication-we need to know how long dentist wants pt to hold med)? How long should Brilinta & Baby Aspirin be held prior

## 2020-06-16 NOTE — Telephone Encounter (Signed)
Please contact requesting office for more details about how many teeth will be extracted during his surgery. Thank you.

## 2020-06-16 NOTE — Telephone Encounter (Signed)
   Primary Cardiologist: Candee Furbish, MD  Chart reviewed as part of pre-operative protocol coverage. Simple dental extractions are considered low risk procedures per guidelines and generally do not require any specific cardiac clearance. It is also generally accepted that for simple extractions and dental cleanings, there is no need to interrupt blood thinner therapy.   SBE prophylaxis is not required for the patient.  I will route this recommendation to the requesting party via Epic fax function and remove from pre-op pool.  Please call with questions.  Deberah Pelton, NP 06/16/2020, 11:45 AM

## 2020-06-21 ENCOUNTER — Other Ambulatory Visit: Payer: Self-pay | Admitting: Nurse Practitioner

## 2020-06-21 NOTE — Progress Notes (Signed)
CARDIOLOGY OFFICE NOTE  Date:  06/30/2020    Robert Reilly Date of Birth: [birth year] Medical Record #[id number]  PCP:  Robert Reilly, Robert Sa, Robert Reilly  Cardiologist:  Robert Reilly [facility]   Chief Complaint  Patient presents with  . Follow-up    History of Present Illness: Robert Reilly is a 79 y.o. male who presents today for a 4 month check. Seen for Dr. Marlou Porch but primarily follows with me.   He has a history ofCAD with MI in 2004, with stent to RCA.Other issues include HTN, HLD and RA.He has had some situational stress with being in the caregiver role for his wife.  When seen in mid December - elevators were brokenhereand he walked up all 3 flights of stairs without issue.He wasfelt to bedoing ok- did advise him to stop his Mobic.  He then presented 2 weeks later with severe heartburn and chest pain - initially improved - then returned while eating spaghetti - took 4 baby aspirin and came to the ER. Admitted for cardiac cath. Found to have single vessel CAD of the mRCA with focal lesion above the prior RCA stent. On DAPT for 6 months but likely one year total. Echo with normal EF and G1DD.  [place] him 2 weeks ago after his discharge due to chest pain . Added low dose Imdur and increased his PPI. Diet had been poor - had been eating hot dogsat [employer].On return he was doing better but symptoms not totally resolved - he was concerned about possible pancreatitis -andhad stopped his alcohol.Seen back several times since - has had several concerns - wanting to be on less medicine. Walking 3 miles a day. Metoprolol has been cut back for fatigue. Last seen in May and felt to be doing ok. Continues to want to cut back medicines.   Comes in today. Here alone. He had a tooth pulled yesterday - little excessive bleeding but stopped with tea bags. He has had a close friend die yesterday. Fortunately, he feels ok. No chest pain. Blood pressure looks good. He feels good on his  medicines. Does need labs today.   Past Medical History:  Diagnosis Date  . Acute myocardial infarction of other inferior wall, subsequent episode of care   . Cancer ([place])    melenoma  back , left arm   4-5 yrs ago  . Coronary atherosclerosis of native coronary artery   . GERD (gastroesophageal reflux disease)   . Gout   . History of hiatal hernia   . Hypercholesteremia   . Hypertension   . Old MI (myocardial infarction) 09/10/2013   2004, stent to RCA  . Osteoarthritis   . Other disorder of calcium metabolism   . Tick bite 05/25/2016   states has had 5 in past month    Past Surgical History:  Procedure Laterality Date  . CARDIAC CATHETERIZATION    . CORONARY ANGIOPLASTY     stent x 1    12-15 yrs ago  . CORONARY STENT INTERVENTION N/A 10/28/2019   Procedure: CORONARY STENT INTERVENTION;  Surgeon: Leonie Man, Robert Reilly;  Location: [facility];  Service: Cardiovascular;  Laterality: N/A;  . EYE SURGERY Bilateral    cataracts  . INSERTION OF MESH N/A 05/30/2016   Procedure: INSERTION OF MESH;  Surgeon: Jackolyn Confer, Robert Reilly;  Location: [facility];  Service: General;  Laterality: N/A;  . LEFT HEART CATH AND CORONARY ANGIOGRAPHY N/A 10/28/2019   Procedure: LEFT HEART CATH AND CORONARY ANGIOGRAPHY;  Surgeon: Leonie Man, Robert Reilly;  Location: [facility];  Service: Cardiovascular;  Laterality: N/A;  . MELANOMA EXCISION     back, left arm  . SHOULDER ARTHROSCOPY WITH ROTATOR CUFF REPAIR Left   . UMBILICAL HERNIA REPAIR N/A 05/30/2016   Procedure: UMBILICAL HERNIA REPAIR;  Surgeon: Jackolyn Confer, Robert Reilly;  Location: [facility];  Service: General;  Laterality: N/A;     Medications: Current Meds  Medication Sig  . allopurinol (ZYLOPRIM) 100 MG tablet Take 100 mg by mouth 2 (two) times daily.   Marland Kitchen amLODipine (NORVASC) 5 MG tablet Take 5 mg by mouth at bedtime.  . Ascorbic Acid (VITAMIN C) 100 MG tablet Take 100 mg by mouth daily.  Marland Kitchen aspirin EC 81 MG tablet Take 81 mg by mouth daily.    . colchicine 0.6 MG tablet Take 0.6 mg by mouth every other day.   . Flaxseed, Linseed, (FLAX SEED OIL) 1000 MG CAPS Take 1,000 mg by mouth 2 (two) times daily.   . hydrochlorothiazide (HYDRODIURIL) 25 MG tablet TAKE 1 TABLET BY MOUTH DAILY.  . metoprolol succinate (TOPROL-XL) 25 MG 24 hr tablet Take 1 tablet (25 mg total) by mouth daily. Take with or immediately following a meal.  . Multiple Vitamins-Minerals (AIRBORNE GUMMIES PO) Take 2 tablets by mouth daily.  . nitroGLYCERIN (NITROSTAT) 0.4 MG SL tablet Place 1 tablet (0.4 mg total) under the tongue every 5 (five) minutes as needed for chest pain.  Marland Kitchen omeprazole (PRILOSEC) 20 MG capsule TAKE 1 CAPSULE BY MOUTH 2 TIMES DAILY BEFORE A MEAL.  . ramipril (ALTACE) 10 MG capsule Take 1 capsule (10 mg total) by mouth daily.  . rosuvastatin (CRESTOR) 20 MG tablet Take 20 mg by mouth at bedtime.  . solifenacin (VESICARE) 5 MG tablet Take 5 mg by mouth daily.  . ticagrelor (BRILINTA) 90 MG TABS tablet Take 1 tablet (90 mg total) by mouth 2 (two) times daily.     Allergies: Allergies  Allergen Reactions  . Tetracyclines & Related Rash  . Other Other (See Comments)    No meat: Came from a tick bite     Social History: The patient  reports that he quit smoking about 19 years ago. His smoking use included cigarettes. He has quit using smokeless tobacco. He reports current alcohol use. He reports that he does not use drugs.   Family History: The patient's family history includes Cancer in his father; Stroke in his mother.   Review of Systems: Please see the history of present illness.   All other systems are reviewed and negative.   Physical Exam: VS:  BP 130/82   Pulse 60   Ht 5' 8.5" (1.74 m)   Wt 178 lb 9.6 oz (81 kg)   SpO2 99%   BMI 26.76 kg/m  .  BMI Body mass index is 26.76 kg/m.  Wt Readings from Last 3 Encounters:  06/30/20 178 lb 9.6 oz (81 kg)  03/03/20 178 lb (80.7 kg)  01/28/20 179 lb 12.8 oz (81.6 kg)    General:  Pleasant. Alert and in no acute distress.   Cardiac: Regular rate and rhythm. No murmurs, rubs, or gallops. No edema.  Respiratory:  Lungs are clear to auscultation bilaterally with normal work of breathing.  GI: Soft and nontender.  MS: No deformity or atrophy. Gait and ROM intact.  Skin: Warm and dry. Color is normal.  Neuro:  Strength and sensation are intact and no gross focal deficits noted.  Psych: Alert, appropriate and  with normal affect.   LABORATORY DATA:  EKG:  EKG is not ordered today.    Lab Results  Component Value Date   WBC 7.1 11/17/2019   HGB 15.4 11/17/2019   HCT 43.5 11/17/2019   PLT 250 11/17/2019   GLUCOSE 102 (H) 11/17/2019   CHOL 124 10/14/2019   TRIG 137 10/14/2019   HDL 37 (L) 10/14/2019   LDLDIRECT 76.0 03/15/2015   LDLCALC 63 10/14/2019   ALT 25 10/28/2019   AST 25 10/28/2019   NA 143 11/17/2019   K 3.9 11/17/2019   CL 102 11/17/2019   CREATININE 1.38 (H) 11/17/2019   BUN 25 11/17/2019   CO2 27 11/17/2019   TSH 4.601 (H) 10/28/2019   HGBA1C 5.9 (H) 10/28/2019     BNP (last 3 results) No results for input(s): BNP in the last 8760 hours.  ProBNP (last 3 results) No results for input(s): PROBNP in the last 8760 hours.   Other Studies Reviewed Today:  Cath: 32/44/01   LV end diastolic pressure is mildly elevated.  The left ventricular systolic function is normal.  There is no aortic valve stenosis.  CULPRIT LESION: Mid RCA lesion is 90% stenosed.  A drug-eluting stent was successfully placed using a STENT RESOLUTE ONYX 3.0X12. - post-dilated to 3.35 mm  Post intervention, there is a 0% residual stenosis.  Previously placed Mid RCA to Dist RCA stent (DES) is widely patent.  Separate [place] for LAD & LCx  SUMMARY  Severe Single Vessel CAD - mRCA 90% focal lesion upstream from m-dRCA stent  Successful DES PCI of mRCA (Resolute Onyx DES 3.0 x 12 -> post-dilated to 3.35 mm)  No significant disease in LAD & LCx systems - each  wtih separate ostia.  Likely preserved LVEF (inadequate LV Gram to assess wall motion) with mildly elevated LVEDP   RECOMMENDATIONS  Overnight admission post PCI  TR Band removal per protocol  Continue to titrate IV NTG overnight for BP control  ASA/Brilinta DAPT x min 6 months uninterrupted (ok to interrupt after 6 months)  Ensure statin & Beta Blocker (pending HR monitoring overnight)   [name], MD, MS    TTE: 10/28/19 IMPRESSIONS  1. Left ventricular ejection fraction, by visual estimation, is 55 to 60%. The left ventricle has normal function. Left ventricular septal wall thickness was mildly increased. Mildly increased left ventricular posterior wall thickness. There is mildly  increased left ventricular hypertrophy. 2. Left ventricular diastolic parameters are consistent with Grade I diastolic dysfunction (impaired relaxation). 3. The left ventricle has no regional wall motion abnormalities. 4. Global right ventricle has normal systolic function.The right ventricular size is normal. No increase in right ventricular wall thickness. 5. Left atrial size was normal. 6. Right atrial size was normal. 7. The mitral valve is normal in structure. Trivial mitral valve regurgitation. No evidence of mitral stenosis. 8. The tricuspid valve is normal in structure. 9. The aortic valve is tricuspid. Aortic valve regurgitation is not visualized. No evidence of aortic valve sclerosis or stenosis. 10. The pulmonic valve was normal in structure. Pulmonic valve regurgitation is not visualized. 11. TR signal is inadequate for assessing pulmonary artery systolic pressure. 12. The inferior vena cava is normal in size with <50% respiratory variability, suggesting right atrial pressure of 8 mmHg.    Assessment/Plan:  1. CAD with prior PCI to RCA for single vessel disease in 09/2019 - remains on DAPT - doing well now - had had some atypical chest pain following this  PCI. On  less Toprol due to some fatigue. No longer on his Imdur. We will continue with aspirin/Brilinta/Toprol and statin therapy.   2. HTN - BP looks good - on less Toprol due to fatigue - would continue Norvasc. Needs to continue with salt restriction as well.   3. HLD - on statin - labs today.   4. GERD - not really endorsed today.   5. Recent tooth extraction    Current medicines are reviewed with the patient today.  The patient does not have concerns regarding medicines other than what has been noted above.  The following changes have been made:  See above.  Labs/ tests ordered today include:    Orders Placed This Encounter  Procedures  . Basic metabolic panel  . CBC  . Hepatic function panel  . Lipid panel  . TSH     Disposition:   FU with [place] in 6 months.    Patient is agreeable to this plan and will call if any problems develop in the interim.   SignedTruitt Merle, NP  06/30/2020 9:13 AM  [facility] [address] [place], [place]  [address] Phone: [phone] Fax: [phone]

## 2020-06-30 ENCOUNTER — Ambulatory Visit: Payer: BC Managed Care – PPO | Admitting: Nurse Practitioner

## 2020-06-30 ENCOUNTER — Other Ambulatory Visit: Payer: Self-pay

## 2020-06-30 ENCOUNTER — Encounter: Payer: Self-pay | Admitting: Nurse Practitioner

## 2020-06-30 VITALS — BP 130/82 | HR 60 | Ht 68.5 in | Wt 178.6 lb

## 2020-06-30 DIAGNOSIS — Z955 Presence of coronary angioplasty implant and graft: Secondary | ICD-10-CM | POA: Diagnosis not present

## 2020-06-30 DIAGNOSIS — K219 Gastro-esophageal reflux disease without esophagitis: Secondary | ICD-10-CM | POA: Diagnosis not present

## 2020-06-30 DIAGNOSIS — I200 Unstable angina: Secondary | ICD-10-CM

## 2020-06-30 DIAGNOSIS — I10 Essential (primary) hypertension: Secondary | ICD-10-CM | POA: Diagnosis not present

## 2020-06-30 DIAGNOSIS — E7800 Pure hypercholesterolemia, unspecified: Secondary | ICD-10-CM

## 2020-06-30 LAB — HEPATIC FUNCTION PANEL
ALT: 16 IU/L (ref 0–44)
AST: 16 IU/L (ref 0–40)
Albumin: 4 g/dL (ref 3.7–4.7)
Alkaline Phosphatase: 48 IU/L (ref 48–121)
Bilirubin Total: 0.8 mg/dL (ref 0.0–1.2)
Bilirubin, Direct: 0.19 mg/dL (ref 0.00–0.40)
Total Protein: 7 g/dL (ref 6.0–8.5)

## 2020-06-30 LAB — LIPID PANEL
Chol/HDL Ratio: 3.5 ratio (ref 0.0–5.0)
Cholesterol, Total: 123 mg/dL (ref 100–199)
HDL: 35 mg/dL — ABNORMAL LOW (ref >39)
LDL Chol Calc (NIH): 56 mg/dL (ref 0–99)
Triglycerides: 195 mg/dL — ABNORMAL HIGH (ref 0–149)
VLDL Cholesterol Cal: 32 mg/dL (ref 5–40)

## 2020-06-30 LAB — CBC
Hematocrit: 43.6 % (ref 37.5–51.0)
Hemoglobin: 15 g/dL (ref 13.0–17.7)
MCH: 33.8 pg — ABNORMAL HIGH (ref 26.6–33.0)
MCHC: 34.4 g/dL (ref 31.5–35.7)
MCV: 98 fL — ABNORMAL HIGH (ref 79–97)
Platelets: 210 10*3/uL (ref 150–450)
RBC: 4.44 x10E6/uL (ref 4.14–5.80)
RDW: 14.1 % (ref 11.6–15.4)
WBC: 8.6 10*3/uL (ref 3.4–10.8)

## 2020-06-30 LAB — BASIC METABOLIC PANEL
BUN/Creatinine Ratio: 17 (ref 10–24)
BUN: 20 mg/dL (ref 8–27)
CO2: 23 mmol/L (ref 20–29)
Calcium: 9.5 mg/dL (ref 8.6–10.2)
Chloride: 103 mmol/L (ref 96–106)
Creatinine, Ser: 1.19 mg/dL (ref 0.76–1.27)
GFR calc Af Amer: 67 mL/min/{1.73_m2} (ref >59)
GFR calc non Af Amer: 58 mL/min/{1.73_m2} — ABNORMAL LOW (ref >59)
Glucose: 117 mg/dL — ABNORMAL HIGH (ref 65–99)
Potassium: 3.5 mmol/L (ref 3.5–5.2)
Sodium: 140 mmol/L (ref 134–144)

## 2020-06-30 LAB — TSH: TSH: 3.26 u[IU]/mL (ref 0.450–4.500)

## 2020-06-30 NOTE — Patient Instructions (Addendum)
After Visit Summary:  We will be checking the following labs today - BMET, CBC, lipids, LFTs and TSH  Medication Instructions:    Continue with your current medicines.    If you need a refill on your cardiac medications before your next appointment, please call your pharmacy.     Testing/Procedures To Be Arranged:  N/A  Follow-Up:   See [place] back in 6 months - You will receive a reminder letter in the mail two months in advance. If you don't receive a letter, please call our office to schedule the follow-up appointment.     At [facility], you and your health needs are our priority.  As part of our continuing mission to provide you with exceptional heart care, we have created designated Provider Care Teams.  These Care Teams include your primary Cardiologist (physician) and Advanced Practice Providers (APPs -  Physician Assistants and Nurse Practitioners) who all work together to provide you with the care you need, when you need it.  Special Instructions:  . Stay safe, wash your hands for at least 20 seconds and wear a mask when needed.  . It was good to talk with you today.    Call the [place] office at [phone] if you have any questions, problems or concerns.

## 2020-07-06 ENCOUNTER — Other Ambulatory Visit: Payer: Self-pay | Admitting: Nurse Practitioner

## 2020-07-12 DIAGNOSIS — Z1589 Genetic susceptibility to other disease: Secondary | ICD-10-CM | POA: Diagnosis not present

## 2020-07-12 DIAGNOSIS — M4690 Unspecified inflammatory spondylopathy, site unspecified: Secondary | ICD-10-CM | POA: Diagnosis not present

## 2020-07-12 DIAGNOSIS — M1A09X0 Idiopathic chronic gout, multiple sites, without tophus (tophi): Secondary | ICD-10-CM | POA: Diagnosis not present

## 2020-07-12 DIAGNOSIS — M1120 Other chondrocalcinosis, unspecified site: Secondary | ICD-10-CM | POA: Diagnosis not present

## 2020-07-27 ENCOUNTER — Telehealth: Payer: Self-pay | Admitting: Nurse Practitioner

## 2020-07-27 NOTE — Telephone Encounter (Signed)
Yes.   Robert Reilly

## 2020-07-27 NOTE — Telephone Encounter (Signed)
lvm per (DPR) . It is ok for pt to get booster.  Will send to [place] to [place].

## 2020-07-27 NOTE — Telephone Encounter (Signed)
    Pt would like to asked Robert Reilly if it safe for him to get the booster covid shot

## 2020-08-02 ENCOUNTER — Other Ambulatory Visit: Payer: Self-pay | Admitting: Cardiology

## 2020-08-02 ENCOUNTER — Other Ambulatory Visit: Payer: Self-pay | Admitting: Nurse Practitioner

## 2020-08-03 DIAGNOSIS — L570 Actinic keratosis: Secondary | ICD-10-CM | POA: Diagnosis not present

## 2020-08-03 DIAGNOSIS — L821 Other seborrheic keratosis: Secondary | ICD-10-CM | POA: Diagnosis not present

## 2020-08-03 DIAGNOSIS — D1801 Hemangioma of skin and subcutaneous tissue: Secondary | ICD-10-CM | POA: Diagnosis not present

## 2020-08-03 DIAGNOSIS — Z85820 Personal history of malignant melanoma of skin: Secondary | ICD-10-CM | POA: Diagnosis not present

## 2020-08-03 DIAGNOSIS — Z85828 Personal history of other malignant neoplasm of skin: Secondary | ICD-10-CM | POA: Diagnosis not present

## 2020-08-03 MED ORDER — TICAGRELOR 90 MG PO TABS
90.0000 mg | ORAL_TABLET | Freq: Two times a day (BID) | ORAL | 3 refills | Status: DC
Start: 2020-08-03 — End: 2021-03-08

## 2020-10-28 DIAGNOSIS — Z20822 Contact with and (suspected) exposure to covid-19: Secondary | ICD-10-CM | POA: Diagnosis not present

## 2020-10-28 DIAGNOSIS — Z03818 Encounter for observation for suspected exposure to other biological agents ruled out: Secondary | ICD-10-CM | POA: Diagnosis not present

## 2020-11-23 IMAGING — DX DG CHEST 2V
2 series · 2 of 2 positions shown · non-contrast
Comparison: Prior radiograph from 10/10/2017.

CLINICAL DATA: Initial evaluation for acute chest pain.

EXAM:
CHEST - 2 VIEW

[chest pa]
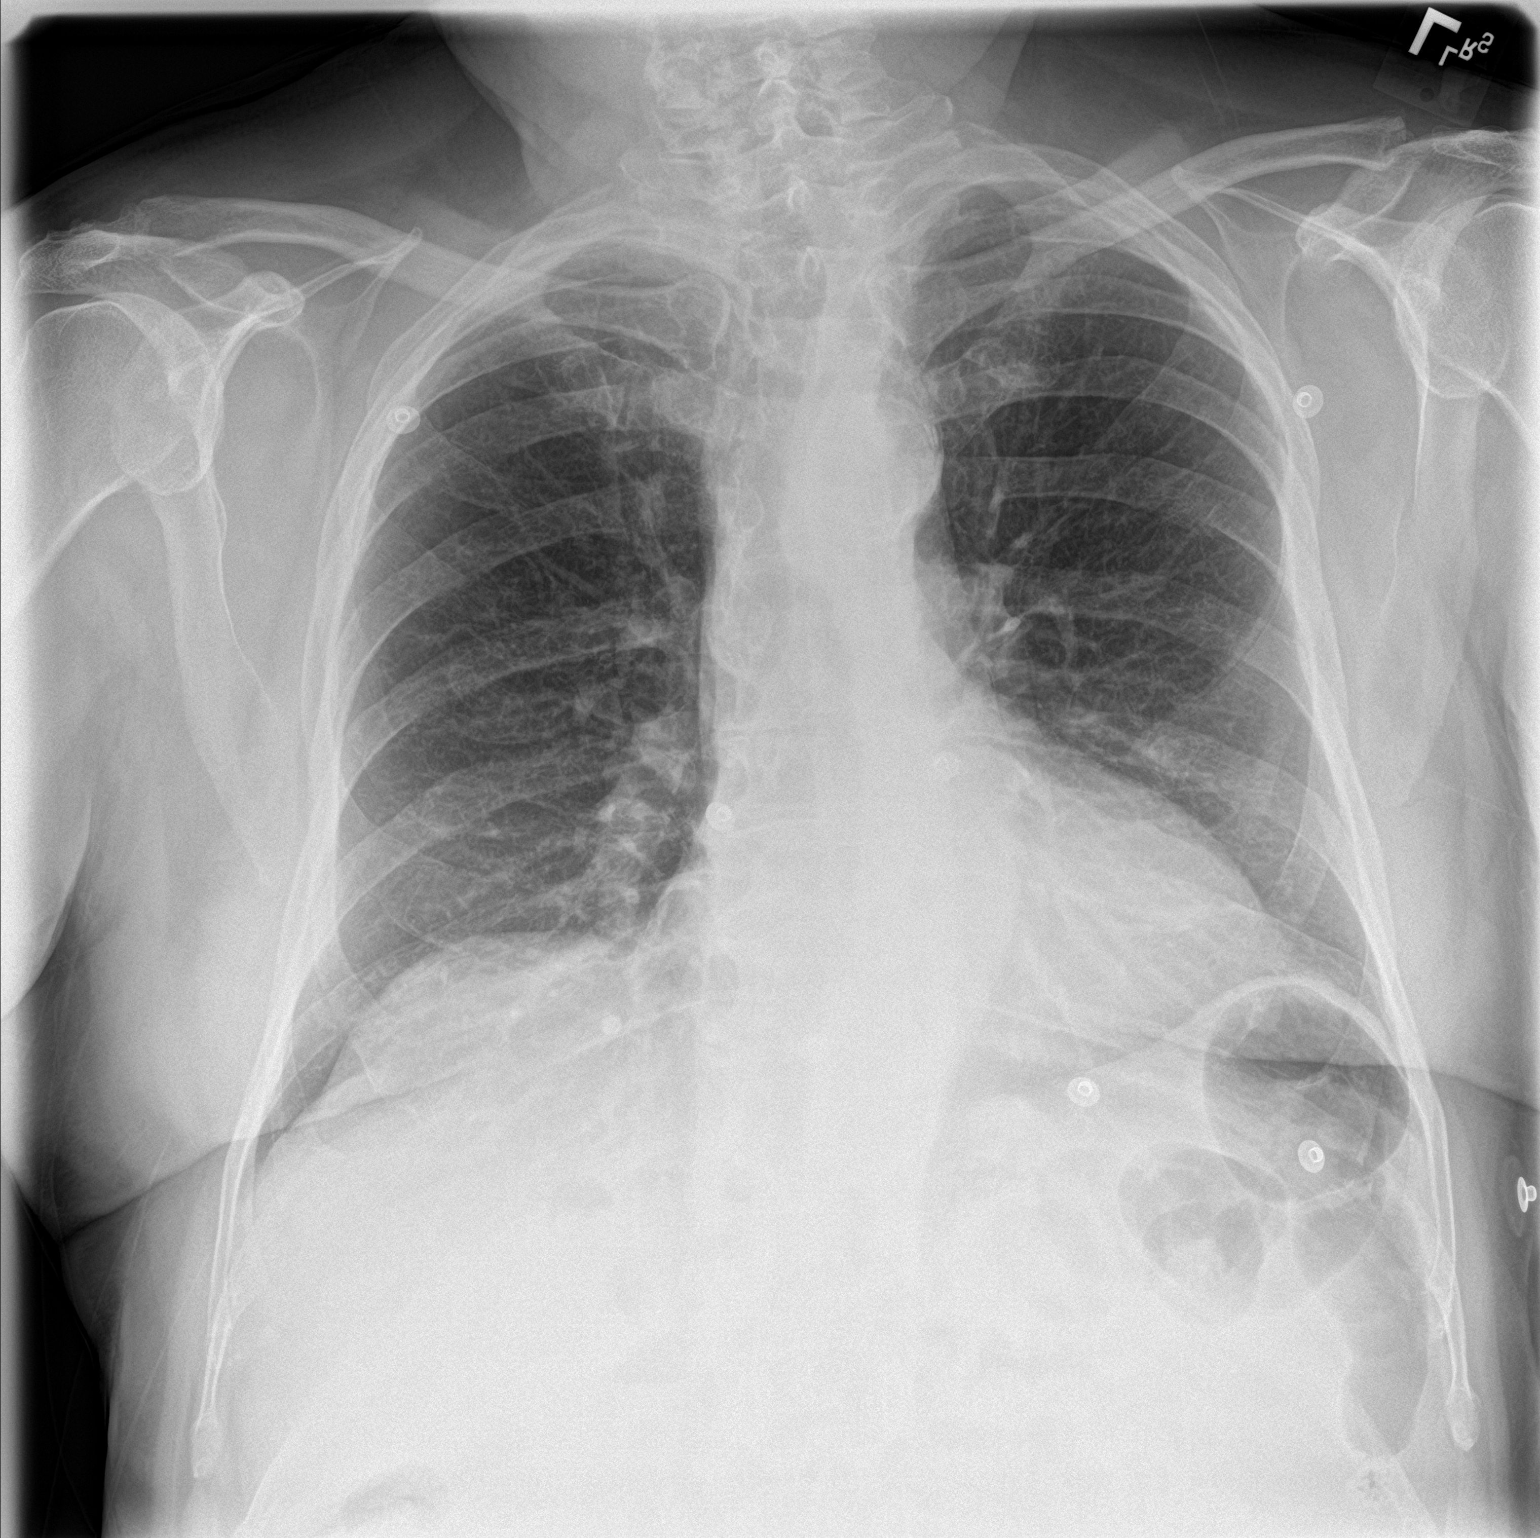

[chest lat]
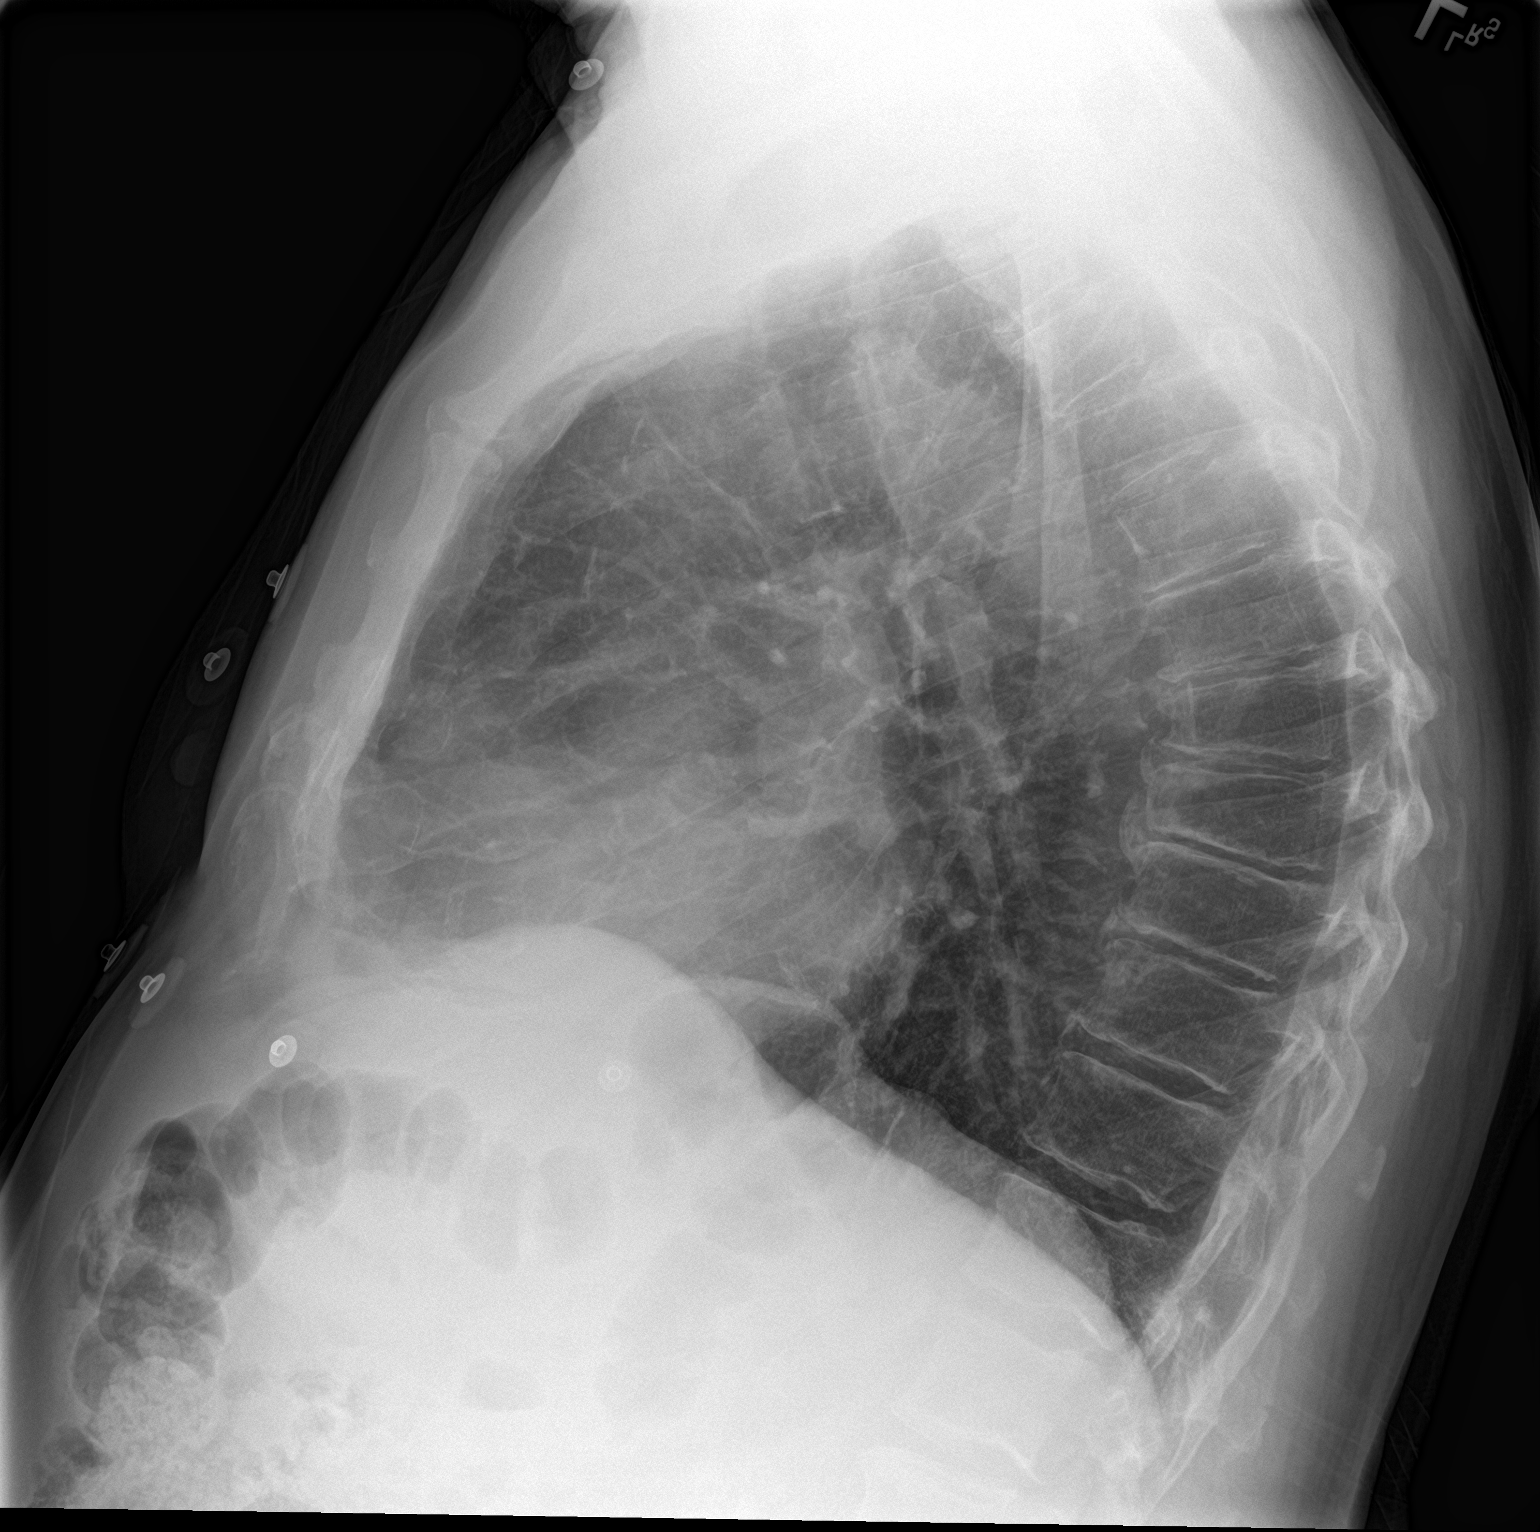

[2 of 2 positions shown; findings below may reference images not displayed]

FINDINGS: Mild cardiomegaly. Mediastinal silhouette within normal limits. Mild
tortuosity the intrathoracic aorta noted. Aortic atherosclerosis.

Lungs mildly hypoinflated. Mild streaky bibasilar subsegmental
atelectasis. No focal infiltrates. No edema or effusion. No
pneumothorax.

No acute osseous finding.
IMPRESSION: 1. No active cardiopulmonary disease.
2. Mild cardiomegaly without edema.
3.  Aortic Atherosclerosis ([id number]).

## 2020-12-08 DIAGNOSIS — Z0000 Encounter for general adult medical examination without abnormal findings: Secondary | ICD-10-CM | POA: Diagnosis not present

## 2020-12-27 ENCOUNTER — Ambulatory Visit: Payer: BC Managed Care – PPO | Admitting: Cardiology

## 2020-12-30 ENCOUNTER — Ambulatory Visit: Payer: BC Managed Care – PPO | Admitting: Cardiology

## 2021-01-10 DIAGNOSIS — M4690 Unspecified inflammatory spondylopathy, site unspecified: Secondary | ICD-10-CM | POA: Diagnosis not present

## 2021-01-10 DIAGNOSIS — Z1589 Genetic susceptibility to other disease: Secondary | ICD-10-CM | POA: Diagnosis not present

## 2021-01-10 DIAGNOSIS — M1A09X0 Idiopathic chronic gout, multiple sites, without tophus (tophi): Secondary | ICD-10-CM | POA: Diagnosis not present

## 2021-01-10 DIAGNOSIS — M1120 Other chondrocalcinosis, unspecified site: Secondary | ICD-10-CM | POA: Diagnosis not present

## 2021-01-28 DIAGNOSIS — R0789 Other chest pain: Secondary | ICD-10-CM | POA: Diagnosis not present

## 2021-02-02 ENCOUNTER — Other Ambulatory Visit: Payer: Self-pay | Admitting: *Deleted

## 2021-02-02 DIAGNOSIS — Z85828 Personal history of other malignant neoplasm of skin: Secondary | ICD-10-CM | POA: Diagnosis not present

## 2021-02-02 DIAGNOSIS — D1801 Hemangioma of skin and subcutaneous tissue: Secondary | ICD-10-CM | POA: Diagnosis not present

## 2021-02-02 DIAGNOSIS — L812 Freckles: Secondary | ICD-10-CM | POA: Diagnosis not present

## 2021-02-02 DIAGNOSIS — Z85820 Personal history of malignant melanoma of skin: Secondary | ICD-10-CM | POA: Diagnosis not present

## 2021-02-02 DIAGNOSIS — L570 Actinic keratosis: Secondary | ICD-10-CM | POA: Diagnosis not present

## 2021-02-03 ENCOUNTER — Other Ambulatory Visit: Payer: Self-pay | Admitting: Cardiology

## 2021-02-03 DIAGNOSIS — I10 Essential (primary) hypertension: Secondary | ICD-10-CM

## 2021-02-03 DIAGNOSIS — K219 Gastro-esophageal reflux disease without esophagitis: Secondary | ICD-10-CM

## 2021-02-07 DIAGNOSIS — S76219A Strain of adductor muscle, fascia and tendon of unspecified thigh, initial encounter: Secondary | ICD-10-CM | POA: Diagnosis not present

## 2021-02-10 DIAGNOSIS — R8271 Bacteriuria: Secondary | ICD-10-CM | POA: Diagnosis not present

## 2021-02-10 DIAGNOSIS — R350 Frequency of micturition: Secondary | ICD-10-CM | POA: Diagnosis not present

## 2021-02-10 DIAGNOSIS — R3915 Urgency of urination: Secondary | ICD-10-CM | POA: Diagnosis not present

## 2021-02-10 DIAGNOSIS — R351 Nocturia: Secondary | ICD-10-CM | POA: Diagnosis not present

## 2021-02-10 DIAGNOSIS — R102 Pelvic and perineal pain: Secondary | ICD-10-CM | POA: Diagnosis not present

## 2021-02-26 ENCOUNTER — Encounter (HOSPITAL_COMMUNITY): Payer: Self-pay

## 2021-02-26 ENCOUNTER — Emergency Department (HOSPITAL_COMMUNITY): Payer: BC Managed Care – PPO

## 2021-02-26 ENCOUNTER — Other Ambulatory Visit: Payer: Self-pay

## 2021-02-26 ENCOUNTER — Inpatient Hospital Stay (HOSPITAL_COMMUNITY)
Admission: EM | Admit: 2021-02-26 | Discharge: 2021-03-01 | DRG: 343 | Disposition: A | Discharge status: Home / Self Care | Payer: BC Managed Care – PPO | Attending: Surgery | Admitting: Surgery

## 2021-02-26 DIAGNOSIS — I2510 Atherosclerotic heart disease of native coronary artery without angina pectoris: Secondary | ICD-10-CM | POA: Diagnosis not present

## 2021-02-26 DIAGNOSIS — Z955 Presence of coronary angioplasty implant and graft: Secondary | ICD-10-CM | POA: Diagnosis not present

## 2021-02-26 DIAGNOSIS — M069 Rheumatoid arthritis, unspecified: Secondary | ICD-10-CM | POA: Diagnosis present

## 2021-02-26 DIAGNOSIS — Z85828 Personal history of other malignant neoplasm of skin: Secondary | ICD-10-CM | POA: Diagnosis not present

## 2021-02-26 DIAGNOSIS — M109 Gout, unspecified: Secondary | ICD-10-CM

## 2021-02-26 DIAGNOSIS — K353 Acute appendicitis with localized peritonitis, without perforation or gangrene: Secondary | ICD-10-CM

## 2021-02-26 DIAGNOSIS — E7800 Pure hypercholesterolemia, unspecified: Secondary | ICD-10-CM | POA: Diagnosis present

## 2021-02-26 DIAGNOSIS — E876 Hypokalemia: Secondary | ICD-10-CM | POA: Diagnosis not present

## 2021-02-26 DIAGNOSIS — Z79899 Other long term (current) drug therapy: Secondary | ICD-10-CM

## 2021-02-26 DIAGNOSIS — Z87891 Personal history of nicotine dependence: Secondary | ICD-10-CM | POA: Diagnosis not present

## 2021-02-26 DIAGNOSIS — Z7982 Long term (current) use of aspirin: Secondary | ICD-10-CM | POA: Diagnosis not present

## 2021-02-26 DIAGNOSIS — K5730 Diverticulosis of large intestine without perforation or abscess without bleeding: Secondary | ICD-10-CM | POA: Diagnosis not present

## 2021-02-26 DIAGNOSIS — N3289 Other specified disorders of bladder: Secondary | ICD-10-CM

## 2021-02-26 DIAGNOSIS — I10 Essential (primary) hypertension: Secondary | ICD-10-CM

## 2021-02-26 DIAGNOSIS — K219 Gastro-esophageal reflux disease without esophagitis: Secondary | ICD-10-CM | POA: Diagnosis not present

## 2021-02-26 DIAGNOSIS — Z20822 Contact with and (suspected) exposure to covid-19: Secondary | ICD-10-CM | POA: Diagnosis present

## 2021-02-26 DIAGNOSIS — K3580 Unspecified acute appendicitis: Principal | ICD-10-CM | POA: Diagnosis present

## 2021-02-26 DIAGNOSIS — I25118 Atherosclerotic heart disease of native coronary artery with other forms of angina pectoris: Secondary | ICD-10-CM

## 2021-02-26 DIAGNOSIS — E785 Hyperlipidemia, unspecified: Secondary | ICD-10-CM | POA: Diagnosis present

## 2021-02-26 DIAGNOSIS — R197 Diarrhea, unspecified: Secondary | ICD-10-CM | POA: Diagnosis not present

## 2021-02-26 DIAGNOSIS — I252 Old myocardial infarction: Secondary | ICD-10-CM

## 2021-02-26 DIAGNOSIS — R1030 Lower abdominal pain, unspecified: Secondary | ICD-10-CM | POA: Diagnosis not present

## 2021-02-26 DIAGNOSIS — I714 Abdominal aortic aneurysm, without rupture: Secondary | ICD-10-CM

## 2021-02-26 DIAGNOSIS — Z888 Allergy status to other drugs, medicaments and biological substances status: Secondary | ICD-10-CM

## 2021-02-26 DIAGNOSIS — R109 Unspecified abdominal pain: Secondary | ICD-10-CM | POA: Diagnosis not present

## 2021-02-26 DIAGNOSIS — I25110 Atherosclerotic heart disease of native coronary artery with unstable angina pectoris: Secondary | ICD-10-CM | POA: Diagnosis not present

## 2021-02-26 DIAGNOSIS — D72829 Elevated white blood cell count, unspecified: Secondary | ICD-10-CM

## 2021-02-26 LAB — COMPREHENSIVE METABOLIC PANEL
ALT: 19 U/L (ref 0–44)
AST: 20 U/L (ref 15–41)
Albumin: 3.7 g/dL (ref 3.5–5.0)
Alkaline Phosphatase: 56 U/L (ref 38–126)
Anion gap: 10 (ref 5–15)
BUN: 23 mg/dL (ref 8–23)
CO2: 24 mmol/L (ref 22–32)
Calcium: 9.1 mg/dL (ref 8.9–10.3)
Chloride: 104 mmol/L (ref 98–111)
Creatinine, Ser: 1.1 mg/dL (ref 0.61–1.24)
GFR, Estimated: >60 mL/min (ref >60)
Glucose, Bld: 116 mg/dL — ABNORMAL HIGH (ref 70–99)
Potassium: 3.3 mmol/L — ABNORMAL LOW (ref 3.5–5.1)
Sodium: 138 mmol/L (ref 135–145)
Total Bilirubin: 0.7 mg/dL (ref 0.3–1.2)
Total Protein: 7.1 g/dL (ref 6.5–8.1)

## 2021-02-26 LAB — CBC
HCT: 44.5 % (ref 39.0–52.0)
Hemoglobin: 15.1 g/dL (ref 13.0–17.0)
MCH: 32.7 pg (ref 26.0–34.0)
MCHC: 33.9 g/dL (ref 30.0–36.0)
MCV: 96.3 fL (ref 80.0–100.0)
Platelets: 223 10*3/uL (ref 150–400)
RBC: 4.62 MIL/uL (ref 4.22–5.81)
RDW: 14.8 % (ref 11.5–15.5)
WBC: 14.4 10*3/uL — ABNORMAL HIGH (ref 4.0–10.5)
nRBC: 0 % (ref 0.0–0.2)

## 2021-02-26 LAB — URINALYSIS, ROUTINE W REFLEX MICROSCOPIC
Bacteria, UA: NONE SEEN
Bilirubin Urine: NEGATIVE
Glucose, UA: NEGATIVE mg/dL
Ketones, ur: NEGATIVE mg/dL
Leukocytes,Ua: NEGATIVE
Nitrite: NEGATIVE
Protein, ur: 30 mg/dL — AB
Specific Gravity, Urine: 1.009 (ref 1.005–1.030)
pH: 8 (ref 5.0–8.0)

## 2021-02-26 LAB — TYPE AND SCREEN
ABO/RH(D): O POS
Antibody Screen: NEGATIVE

## 2021-02-26 LAB — LACTIC ACID, PLASMA: Lactic Acid, Venous: 1.6 mmol/L (ref 0.5–1.9)

## 2021-02-26 LAB — RESP PANEL BY RT-PCR (FLU A&B, COVID) ARPGX2
Influenza A by PCR: NEGATIVE
Influenza B by PCR: NEGATIVE
SARS Coronavirus 2 by RT PCR: NEGATIVE

## 2021-02-26 LAB — ABO/RH: ABO/RH(D): O POS

## 2021-02-26 LAB — APTT: aPTT: 27 seconds (ref 24–36)

## 2021-02-26 LAB — PROTIME-INR
INR: 1 (ref 0.8–1.2)
Prothrombin Time: 13.3 seconds (ref 11.4–15.2)

## 2021-02-26 LAB — LIPASE, BLOOD: Lipase: 38 U/L (ref 11–51)

## 2021-02-26 MED ORDER — ENOXAPARIN SODIUM 40 MG/0.4ML IJ SOSY
40.0000 mg | PREFILLED_SYRINGE | INTRAMUSCULAR | Status: DC
  Administered 2021-02-27 – 2021-03-01 (×3): 40 mg via SUBCUTANEOUS
  Filled 2021-02-26 (×3): qty 0.4

## 2021-02-26 MED ORDER — NITROGLYCERIN 0.4 MG SL SUBL: 0.4000 mg | SUBLINGUAL_TABLET | SUBLINGUAL | Status: DC | PRN

## 2021-02-26 MED ORDER — POTASSIUM CHLORIDE IN NACL 20-0.9 MEQ/L-% IV SOLN
INTRAVENOUS | Status: DC
  Administered 2021-02-27: 100 mL/h via INTRAVENOUS
  Administered 2021-02-28: 1000 mL via INTRAVENOUS
  Filled 2021-02-26 (×7): qty 1000

## 2021-02-26 MED ORDER — SODIUM CHLORIDE 0.9 % IV SOLN
2.0000 g | INTRAVENOUS | Status: DC
  Administered 2021-02-26 – 2021-03-01 (×4): 2 g via INTRAVENOUS
  Filled 2021-02-26 (×2): qty 20
  Filled 2021-02-26: qty 2
  Filled 2021-02-26: qty 20

## 2021-02-26 MED ORDER — AMLODIPINE BESYLATE 5 MG PO TABS
5.0000 mg | ORAL_TABLET | Freq: Every day | ORAL | Status: DC
  Administered 2021-02-26 – 2021-03-01 (×4): 5 mg via ORAL
  Filled 2021-02-26 (×4): qty 1

## 2021-02-26 MED ORDER — MORPHINE SULFATE (PF) 2 MG/ML IV SOLN: 2.0000 mg | INTRAVENOUS | Status: DC | PRN

## 2021-02-26 MED ORDER — PIPERACILLIN-TAZOBACTAM 3.375 G IVPB 30 MIN
3.3750 g | Freq: Once | INTRAVENOUS | Status: AC
  Administered 2021-02-26: 3.375 g via INTRAVENOUS
  Filled 2021-02-26: qty 50

## 2021-02-26 MED ORDER — METRONIDAZOLE 500 MG/100ML IV SOLN
500.0000 mg | Freq: Three times a day (TID) | INTRAVENOUS | Status: DC
  Administered 2021-02-26 – 2021-03-01 (×10): 500 mg via INTRAVENOUS
  Filled 2021-02-26 (×11): qty 100

## 2021-02-26 MED ORDER — HYDROCHLOROTHIAZIDE 25 MG PO TABS
25.0000 mg | ORAL_TABLET | Freq: Every day | ORAL | Status: DC
  Administered 2021-02-26 – 2021-03-01 (×4): 25 mg via ORAL
  Filled 2021-02-26 (×4): qty 1

## 2021-02-26 MED ORDER — PANTOPRAZOLE SODIUM 40 MG PO TBEC
40.0000 mg | DELAYED_RELEASE_TABLET | Freq: Every day | ORAL | Status: DC
  Administered 2021-02-26 – 2021-03-01 (×4): 40 mg via ORAL
  Filled 2021-02-26 (×5): qty 1

## 2021-02-26 MED ORDER — ONDANSETRON HCL 4 MG/2ML IJ SOLN
4.0000 mg | Freq: Four times a day (QID) | INTRAMUSCULAR | Status: DC | PRN
  Administered 2021-02-26: 4 mg via INTRAVENOUS
  Filled 2021-02-26: qty 2

## 2021-02-26 MED ORDER — SODIUM CHLORIDE 0.9 % IV BOLUS
1000.0000 mL | Freq: Once | INTRAVENOUS | Status: AC
  Administered 2021-02-26: 1000 mL via INTRAVENOUS

## 2021-02-26 MED ORDER — HYDROMORPHONE HCL 1 MG/ML IJ SOLN
1.0000 mg | Freq: Once | INTRAMUSCULAR | Status: AC
  Administered 2021-02-26: 1 mg via INTRAVENOUS
  Filled 2021-02-26: qty 1

## 2021-02-26 MED ORDER — DOCUSATE SODIUM 100 MG PO CAPS
100.0000 mg | ORAL_CAPSULE | Freq: Two times a day (BID) | ORAL | Status: DC
  Administered 2021-02-26 – 2021-03-01 (×7): 100 mg via ORAL
  Filled 2021-02-26 (×7): qty 1

## 2021-02-26 MED ORDER — IOHEXOL 350 MG/ML SOLN
100.0000 mL | Freq: Once | INTRAVENOUS | Status: AC | PRN
  Administered 2021-02-26: 100 mL via INTRAVENOUS

## 2021-02-26 MED ORDER — DIPHENHYDRAMINE HCL 50 MG/ML IJ SOLN
12.5000 mg | Freq: Four times a day (QID) | INTRAMUSCULAR | Status: DC | PRN
Start: 2021-02-26 — End: 2021-03-01

## 2021-02-26 MED ORDER — DIPHENHYDRAMINE HCL 12.5 MG/5ML PO ELIX
12.5000 mg | ORAL_SOLUTION | Freq: Four times a day (QID) | ORAL | Status: DC | PRN
Start: 2021-02-26 — End: 2021-03-01

## 2021-02-26 MED ORDER — ALLOPURINOL 100 MG PO TABS
100.0000 mg | ORAL_TABLET | Freq: Two times a day (BID) | ORAL | Status: DC
  Administered 2021-02-26 – 2021-03-01 (×7): 100 mg via ORAL
  Filled 2021-02-26 (×7): qty 1

## 2021-02-26 MED ORDER — ONDANSETRON 4 MG PO TBDP
4.0000 mg | ORAL_TABLET | Freq: Four times a day (QID) | ORAL | Status: DC | PRN
Start: 2021-02-26 — End: 2021-03-01

## 2021-02-26 MED ORDER — RAMIPRIL 10 MG PO CAPS
10.0000 mg | ORAL_CAPSULE | Freq: Every day | ORAL | Status: DC
  Administered 2021-02-27 – 2021-03-01 (×3): 10 mg via ORAL
  Filled 2021-02-26 (×4): qty 1

## 2021-02-26 MED ORDER — ROSUVASTATIN CALCIUM 20 MG PO TABS
20.0000 mg | ORAL_TABLET | Freq: Every day | ORAL | Status: DC
  Administered 2021-02-26 – 2021-02-28 (×3): 20 mg via ORAL
  Filled 2021-02-26 (×3): qty 1

## 2021-02-26 MED ORDER — METOPROLOL SUCCINATE ER 25 MG PO TB24
25.0000 mg | ORAL_TABLET | Freq: Every day | ORAL | Status: DC
  Administered 2021-02-27 – 2021-03-01 (×2): 25 mg via ORAL
  Filled 2021-02-26 (×3): qty 1

## 2021-02-26 MED ORDER — HYDROMORPHONE HCL 1 MG/ML IJ SOLN
0.5000 mg | Freq: Once | INTRAMUSCULAR | Status: AC
  Administered 2021-02-26: 0.5 mg via INTRAVENOUS
  Filled 2021-02-26: qty 1

## 2021-02-26 MED ORDER — DARIFENACIN HYDROBROMIDE ER 7.5 MG PO TB24
7.5000 mg | ORAL_TABLET | Freq: Every day | ORAL | Status: DC
  Administered 2021-02-27 – 2021-03-01 (×3): 7.5 mg via ORAL
  Filled 2021-02-26 (×4): qty 1

## 2021-02-26 MED ORDER — COLCHICINE 0.6 MG PO TABS
0.6000 mg | ORAL_TABLET | ORAL | Status: DC
  Administered 2021-02-26 – 2021-02-28 (×2): 0.6 mg via ORAL
  Filled 2021-02-26 (×3): qty 1

## 2021-02-26 MED ORDER — ZINC SULFATE 220 (50 ZN) MG PO CAPS
220.0000 mg | ORAL_CAPSULE | Freq: Every day | ORAL | Status: DC
  Administered 2021-02-27 – 2021-03-01 (×3): 220 mg via ORAL
  Filled 2021-02-26 (×4): qty 1

## 2021-02-26 NOTE — Consult Note (Addendum)
Cardiology Consultation:   Patient ID: Robert Reilly MRN: [id number]; DOB: October 30, 1941  Admit date: 02/26/2021 Date of Consult: 02/26/2021  PCP:  Alroy Dust, L.Marlou Sa, [place]  Cardiologist:  Candee Furbish, MD  Advanced Practice Provider:  No care team member to display Electrophysiologist:  None   Patient Profile:   Robert Reilly is a 80 y.o. male with a PMH of CAD s/p PCI to RCA in 2004 with subsequent PCI/DES to RCA in 2020, HTN, HLD, and RA,  who is being seen today for the evaluation of preop evaluation at the request of Dr. Georgette Dover.  History of Present Illness:   Mr. Robert Reilly was in his usual state of health until yesterday afternoon when he experienced and episode of diarrhea, followed by development of abdominal pain later that evening. Prior to this he played a round of golf without any trouble. His abdominal pain progressively worsened prompting him to present to the ED for further evaluation.   He was last evaluated by cardiology at an outpatient visit with [name], NP 06/2020, at which time he was doing well from a cardiac standpoint. No medication changes occurred and he was recommended to follow-up in 6 months (scheduled to see Dr. Marlou Porch 03/08/21).   His last ischemic evaluation was a LHC 09/2019 to evaluate chest pain where he was found to have a 90% mRCA occlusion, proximal to previously placed m-dRCA stent which was patent. He underwent PCI/DES to [facility] lesion, otherwise had no significant CAD noted. His last echocardiogram 09/2019 showed EF 55-60%, mild septal/posterior wall LVH, G1DD, no RWMA, , and no significant valvular abnormalities.   From a cardiac standpoint he has been stable over the past several months. Occasionally experiences a vague "emptiness" in his chest which occurs with strenuous activity, though this is a rare occurrence and has not happened in the past month. He is fairly active at baseline, playing golf and hunting. No recent  anginal complaints.   Hospital course: BP initially mildly elevated, now improved; otherwise VSS. Labs notable for K 3.3, Cr 1.1, WBC 14.4, Hgb 15.1, PLT 223. EKG with sinus rhythm, rate 75 bpm, non-specific T wave abnormalities, baseline artifact without overt STE/D; no significant change from previous. CT A/P showed inflammation of the gallbladder and appendix c/f early appendicitis, as well as right lateral bladder wall thickening (recommend urology follow-up). RUQ [place] was not suggestive of acute cholecystitis. Patient was admitted to surgery for conservative management of his appendicitis. Cardiology asked to evaluate for preop assessment in the even conservative management is unsuccessful.   Past Medical History:  Diagnosis Date  . Acute myocardial infarction of other inferior wall, subsequent episode of care   . Cancer ([place])    melenoma  back , left arm   4-5 yrs ago  . Coronary atherosclerosis of native coronary artery   . GERD (gastroesophageal reflux disease)   . Gout   . History of hiatal hernia   . Hypercholesteremia   . Hypertension   . Old MI (myocardial infarction) 09/10/2013   2004, stent to RCA  . Osteoarthritis   . Other disorder of calcium metabolism   . Tick bite 05/25/2016   states has had 5 in past month    Past Surgical History:  Procedure Laterality Date  . CARDIAC CATHETERIZATION    . CORONARY ANGIOPLASTY     stent x 1    12-15 yrs ago  . CORONARY STENT INTERVENTION N/A 10/28/2019   Procedure: CORONARY STENT  INTERVENTION;  Surgeon: Leonie Man, MD;  Location: [place] [facility];  Service: Cardiovascular;  Laterality: N/A;  . EYE SURGERY Bilateral    cataracts  . INSERTION OF MESH N/A 05/30/2016   Procedure: INSERTION OF MESH;  Surgeon: Jackolyn Confer, MD;  Location: [facility];  Service: General;  Laterality: N/A;  . LEFT HEART CATH AND CORONARY ANGIOGRAPHY N/A 10/28/2019   Procedure: LEFT HEART CATH AND CORONARY ANGIOGRAPHY;  Surgeon: Leonie Man, MD;   Location: [place] [facility];  Service: Cardiovascular;  Laterality: N/A;  . MELANOMA EXCISION     back, left arm  . SHOULDER ARTHROSCOPY WITH ROTATOR CUFF REPAIR Left   . UMBILICAL HERNIA REPAIR N/A 05/30/2016   Procedure: UMBILICAL HERNIA REPAIR;  Surgeon: Jackolyn Confer, MD;  Location: [facility];  Service: General;  Laterality: N/A;     Home Medications:  Prior to Admission medications   Medication Sig Start Date End Date Taking? Authorizing Provider  acetaminophen (TYLENOL) 500 MG tablet Take 500 mg by mouth every 6 (six) hours as needed for moderate pain or headache.   Yes [provider]  allopurinol (ZYLOPRIM) 100 MG tablet Take 100 mg by mouth 2 (two) times daily.  02/13/15  Yes [provider]  amLODipine (NORVASC) 5 MG tablet TAKE 1 TABLET BY MOUTH DAILY. Patient taking differently: Take 5 mg by mouth daily. 08/02/20  Yes Burtis Junes, NP  Ascorbic Acid (VITAMIN C) 100 MG tablet Take 100 mg by mouth daily.   Yes [provider]  aspirin EC 81 MG tablet Take 81 mg by mouth daily.   Yes [provider]  colchicine 0.6 MG tablet Take 0.6 mg by mouth every other day.    Yes [provider]  EPINEPHrine 0.3 mg/0.3 mL IJ SOAJ injection Inject 0.3 mg into the skin as needed.   Yes [provider]  Flaxseed, Linseed, (FLAX SEED OIL) 1000 MG CAPS Take 1,000 mg by mouth 2 (two) times daily.    Yes [provider]  hydrochlorothiazide (HYDRODIURIL) 25 MG tablet TAKE 1 TABLET BY MOUTH DAILY. Patient taking differently: Take 25 mg by mouth daily. 02/03/21  Yes Jerline Pain, MD  loratadine (CLARITIN) 10 MG tablet Take 10 mg by mouth daily as needed for itching.   Yes [provider]  metoprolol succinate (TOPROL-XL) 25 MG 24 hr tablet Take 1 tablet (25 mg total) by mouth daily. Take with or immediately following a meal. 02/18/20  Yes Gerhardt, Marlane Hatcher, NP  Multiple Vitamins-Minerals (AIRBORNE GUMMIES PO) Take 2 tablets by mouth  daily.   Yes [provider]  nitroGLYCERIN (NITROSTAT) 0.4 MG SL tablet Place 1 tablet (0.4 mg total) under the tongue every 5 (five) minutes as needed for chest pain. 02/13/18  Yes Burtis Junes, NP  omeprazole (PRILOSEC) 20 MG capsule TAKE 1 CAPSULE BY MOUTH 2 TIMES DAILY BEFORE A MEAL. Patient taking differently: Take 20 mg by mouth 2 (two) times daily before a meal. 02/03/21  Yes Jerline Pain, MD  ramipril (ALTACE) 10 MG capsule Take 1 capsule (10 mg total) by mouth daily. 03/24/20  Yes Burtis Junes, NP  rosuvastatin (CRESTOR) 20 MG tablet TAKE 1 TABLET BY MOUTH AT BEDTIME. Patient taking differently: Take 20 mg by mouth at bedtime. 07/06/20  Yes Burtis Junes, NP  solifenacin (VESICARE) 5 MG tablet Take 5 mg by mouth daily.   Yes [provider]  ticagrelor (BRILINTA) 90 MG TABS tablet Take 1 tablet (  90 mg total) by mouth 2 (two) times daily. 08/03/20  Yes Jerline Pain, MD  Vitamin D, Cholecalciferol, 25 MCG (1000 UT) CAPS Take 1,000 Units by mouth daily.   Yes [provider]  zinc gluconate 50 MG tablet Take 50 mg by mouth daily.   Yes [provider]    Inpatient Medications: Scheduled Meds: . allopurinol  100 mg Oral BID  . amLODipine  5 mg Oral Daily  . colchicine  0.6 mg Oral QODAY  . darifenacin  7.5 mg Oral Daily  . docusate sodium  100 mg Oral BID  . [START ON 02/27/2021] enoxaparin (LOVENOX) injection  40 mg Subcutaneous Q24H  . hydrochlorothiazide  25 mg Oral Daily  . metoprolol succinate  25 mg Oral Daily  . pantoprazole  40 mg Oral Daily  . ramipril  10 mg Oral Daily  . rosuvastatin  20 mg Oral QHS  . zinc gluconate  50 mg Oral Daily   Continuous Infusions: . 0.9 % NaCl with KCl 20 mEq / L    . cefTRIAXone (ROCEPHIN)  IV     And  . metronidazole     PRN Meds: diphenhydrAMINE **OR** diphenhydrAMINE, morphine injection, nitroGLYCERIN, ondansetron **OR** ondansetron (ZOFRAN) IV  Allergies:    Allergies  Allergen Reactions   . Tetracyclines & Related Rash  . Other Other (See Comments)    No meat: Came from a tick bite     Social History:   Social History   Socioeconomic History  . Marital status: Married    Spouse name: Not on file  . Number of children: Not on file  . Years of education: 2  . Highest education level: Master's degree (e.g., MA, MS, MEng, MEd, MSW, MBA)  Occupational History  . Not on file  Tobacco Use  . Smoking status: Former Smoker    Types: Cigarettes    Quit date: 05/25/2001    Years since quitting: 19.7  . Smokeless tobacco: Former Network engineer and Sexual Activity  . Alcohol use: Yes    Comment: 2-3 drinks week- gin  . Drug use: No  . Sexual activity: Not on file  Other Topics Concern  . Not on file  Social History Narrative  . Not on file   Social Determinants of Health   Financial Resource Strain: Not on file  Food Insecurity: Not on file  Transportation Needs: Not on file  Physical Activity: Not on file  Stress: Not on file  Social Connections: Not on file  Intimate Partner Violence: Not on file    Family History:    Family History  Problem Relation Age of Onset  . Stroke Mother   . Cancer Father      ROS:  Please see the history of present illness.   All other ROS reviewed and negative.     Physical Exam/Data:   Vitals:   02/26/21 0733 02/26/21 0800 02/26/21 0815 02/26/21 0830  BP:  107/64  101/80  Pulse:  64 62 62  Resp:  18 12 15   Temp: 98.1 F (36.7 C)     TempSrc: Oral     SpO2:   91% 94%  Weight:      Height:        Intake/Output Summary (Last 24 hours) at 02/26/2021 0846 Last data filed at 02/26/2021 0730 Gross per 24 hour  Intake 1100 ml  Output --  Net 1100 ml   Last 3 Weights 02/26/2021 06/30/2020 03/03/2020  Weight (lbs) 182 lb  178 lb 9.6 oz 178 lb  Weight (kg) 82.555 kg 81.012 kg 80.74 kg     Body mass index is 27.67 kg/m.  Physical exam per MD below   EKG:  The EKG was personally reviewed and demonstrates:  sinus  rhythm, rate 75 bpm, non-specific T wave abnormalities, baseline artifact without overt STE/D; no significant change from previous.   Relevant CV Studies: Cath: 57/84/69   LV end diastolic pressure is mildly elevated.  The left ventricular systolic function is normal.  There is no aortic valve stenosis.  CULPRIT LESION: Mid RCA lesion is 90% stenosed.  A drug-eluting stent was successfully placed using a STENT RESOLUTE ONYX 3.0X12. - post-dilated to 3.35 mm  Post intervention, there is a 0% residual stenosis.  Previously placed Mid RCA to Dist RCA stent (DES) is widely patent.  Separate [place] for LAD & LCx  SUMMARY  Severe Single Vessel CAD - mRCA 90% focal lesion upstream from m-dRCA stent  Successful DES PCI of mRCA (Resolute Onyx DES 3.0 x 12 -> post-dilated to 3.35 mm)  No significant disease in LAD & LCx systems - each wtih separate ostia.  Likely preserved LVEF (inadequate LV Gram to assess wall motion) with mildly elevated LVEDP   RECOMMENDATIONS  Overnight admission post PCI  TR Band removal per protocol  Continue to titrate IV NTG overnight for BP control  ASA/Brilinta DAPT x min 6 months uninterrupted (ok to interrupt after 6 months)  Ensure statin & Beta Blocker (pending HR monitoring overnight)   [name], MD, MS    TTE: 10/28/19 IMPRESSIONS  1. Left ventricular ejection fraction, by visual estimation, is 55 to 60%. The left ventricle has normal function. Left ventricular septal wall thickness was mildly increased. Mildly increased left ventricular posterior wall thickness. There is mildly  increased left ventricular hypertrophy. 2. Left ventricular diastolic parameters are consistent with Grade I diastolic dysfunction (impaired relaxation). 3. The left ventricle has no regional wall motion abnormalities. 4. Global right ventricle has normal systolic function.The right ventricular size is normal. No increase in right ventricular  wall thickness. 5. Left atrial size was normal. 6. Right atrial size was normal. 7. The mitral valve is normal in structure. Trivial mitral valve regurgitation. No evidence of mitral stenosis. 8. The tricuspid valve is normal in structure. 9. The aortic valve is tricuspid. Aortic valve regurgitation is not visualized. No evidence of aortic valve sclerosis or stenosis. 10. The pulmonic valve was normal in structure. Pulmonic valve regurgitation is not visualized. 11. TR signal is inadequate for assessing pulmonary artery systolic pressure. 12. The inferior vena cava is normal in size with <50% respiratory variability, suggesting right atrial pressure of 8 mmHg  Laboratory Data:  High Sensitivity Troponin:  No results for input(s): TROPONINIHS in the last 720 hours.   Chemistry Recent Labs  Lab 02/26/21 0047  NA 138  K 3.3*  CL 104  CO2 24  GLUCOSE 116*  BUN 23  CREATININE 1.10  CALCIUM 9.1  GFRNONAA >60  ANIONGAP 10    Recent Labs  Lab 02/26/21 0047  PROT 7.1  ALBUMIN 3.7  AST 20  ALT 19  ALKPHOS 56  BILITOT 0.7   Hematology Recent Labs  Lab 02/26/21 0047  WBC 14.4*  RBC 4.62  HGB 15.1  HCT 44.5  MCV 96.3  MCH 32.7  MCHC 33.9  RDW 14.8  PLT 223   BNPNo results for input(s): BNP, PROBNP in the last 168 hours.  DDimer No results for input(s):  DDIMER in the last 168 hours.   Radiology/Studies:  CT Angio Abd/Pel W and/or Wo Contrast  Result Date: 02/26/2021 CLINICAL DATA:  80 year old male with lower abdominal pain. Diarrhea. EXAM: CTA ABDOMEN AND PELVIS WITHOUT AND WITH CONTRAST TECHNIQUE: Multidetector CT imaging of the abdomen and pelvis was performed using the standard protocol during bolus administration of intravenous contrast. Multiplanar reconstructed images and MIPs were obtained and reviewed to evaluate the vascular anatomy. CONTRAST:  168mL OMNIPAQUE IOHEXOL 350 MG/ML SOLN COMPARISON:  [facility] CT Abdomen and Pelvis 10/21/2012.  FINDINGS: VASCULAR Aortoiliac calcified atherosclerosis. Major arterial structures including the SMA and IMA remain patent. No celiac or SMA origin stenosis. Mild infrarenal abdominal aortic aneurysm (33 mm diameter and 1.5 x the proximal aorta caliber. Mural plaque or thrombus on series 5, image 107. This is proximal to the IMA. Negative for aortic dissection. Iliac and proximal femoral arteries remain patent. Review of the MIP images confirms the above findings. NON-VASCULAR Lower chest: Centrilobular and paraseptal emphysema appears progressed since 2013 along with subpleural lung scarring. No pleural effusion or lung base consolidation. Calcified coronary artery atherosclerosis on series 6, image 7. Cardiac size at the upper limits of normal. No pericardial effusion. Hepatobiliary: Abnormal gallbladder wall with wall thickening or small volume pericholecystic fluid (series 8, image 43). No cholelithiasis evident by CT. No bile duct enlargement. Liver enhancement remains within normal limits. Pancreas: Partially atrophied, otherwise negative. Spleen: Negative. Adrenals/Urinary Tract: Normal adrenal glands. Nonobstructed kidneys with symmetric renal enhancement. No nephrolithiasis. Decompressed ureters. There is abnormal indistinct bladder wall thickening and hyperenhancement up to 15 mm along the right lateral wall of the bladder in an area of about 5 cm. See series 5, image 203. Otherwise unremarkable urinary bladder. Stomach/Bowel: Extensive sigmoid diverticulosis. No active inflammation. Moderate diverticula in the distal descending colon. Mildly redundant transverse colon. Partially fluid-filled cecum. The appendix arises from the cecum on series 5, image 155 and is abnormally enlarged, airless and demonstrates mild surrounding inflammation. Appendix: Location: Caudal to the cecum series 5, image 155 and coronal image 71 Diameter: 11-12 mm Appendicolith: Negative Mucosal hyper-enhancement: Not well evaluated  on this early contrast timed exam Extraluminal gas: Negative Periappendiceal collection: Negative, early peri appendiceal inflammatory stranding. Fluid-filled terminal ileum and other distal small bowel loops appear within normal limits. Negative stomach and duodenum. No free air. No free fluid. Lymphatic: No lymphadenopathy. Reproductive: Negative. Other: No pelvic free fluid. Musculoskeletal: No acute osseous abnormality identified. IMPRESSION: 1. Both the Gallbladder and the Appendix appear inflamed. Right Upper Quadrant Ultrasound would be valuable for further evaluation of the gallbladder. But mildly dilated and inflamed appearance of the appendix is compatible with Early Appendicitis. Recommend Surgery consultation. 2. Suspicious localized right lateral bladder wall thickening and enhancement. Recommend Urology follow-up to evaluate for possible Bladder Carcinoma. No obstructive uropathy. 3. Aortic Atherosclerosis (ICD10-I70.0) with mild Infrarenal Abdominal Aortic Aneurysm. Recommend follow-up ultrasound every 3 years. This recommendation follows ACR consensus guidelines: White Paper of the ACR Incidental Findings Committee II on Vascular Findings. J Am Coll Radiol 2013; 10:789-794. 4. Celiac, SMA and IMA are patent, with no SMA or celiac origin stenosis. 5. Calcified coronary artery atherosclerosis. Emphysema (ICD10-J43.9). Electronically Signed   By: Genevie Ann M.D.   On: 02/26/2021 06:14   US Abdomen Limited RUQ (LIVER/GB)  Result Date: 02/26/2021 CLINICAL DATA:  80 year old male with abdominal pain for 1 day. Abnormal gallbladder on earlier CT. EXAM: ULTRASOUND ABDOMEN LIMITED RIGHT UPPER QUADRANT COMPARISON:  CT Abdomen and Pelvis 0342 hours.  FINDINGS: Gallbladder: Irregular gallbladder wall thickening up to 4 mm. But no echogenic sludge or stones identified within the gallbladder lumen. No pericholecystic fluid. No sonographic Murphy sign elicited. Common bile duct: Diameter: 2 mm, normal. Liver:  Suboptimal visualization of the left hepatic lobe due to bowel gas. Liver echogenicity within normal limits. No discrete liver lesion. No intrahepatic biliary ductal dilatation. Portal vein is patent on color Doppler imaging with normal direction of blood flow towards the liver. Other: Negative visible right kidney. IMPRESSION: 1. Abnormal gallbladder wall thickening, but no gallstones identified, and no sonographic Murphy sign to suggest acute cholecystitis. 2. No evidence of bile duct obstruction. Electronically Signed   By: Genevie Ann M.D.   On: 02/26/2021 07:59     Assessment and Plan:   1. Preoperative assessment: patient presented with RLQ abdominal pain and imaging c/f early appendicitis. General surgery admitted with plans for conservative management. Cardiology asked to evaluate for preop risk in the event conservative management fails. Patient has been doing well from a cardiac standpoint without recent chest pain or DOE. He can complete 4 METs without anginal complaints. He is >1 yr out from last PCI/DES to RCA 09/2019.  - Based on ACC/AHA guidelines, Shoji Dize Levee would be at acceptable risk for the planned procedure without further cardiovascular testing.  - Okay to hold aspirin and brilinta x5 days with plans to restart when cleared to do so from a surgical standpoint.  - Continue BBlocker in the perioperative setting  2. Early appendicitis: patient presented with progressive RLQ abdominal pain. Imaging c/f early appendicitis. Surgery following with plans for conservative management. He is on IV antibiotics. - Continue management per primary team  3. CAD s/p PCI/DES to RCA in 2004 and 09/2019: no recent anginal complaints. Aspirin and brilinta on hold given #2.  - Anticipate restarting aspirin and brilinta when cleared by surgery - Continue BBlocker - Continue statin  4. HTN: BP stable - Continue amlodipine, metoprolol succinate, and HCTZ  5. HLD: LDL 56 06/2020 - Continue  crestor   Risk Assessment/Risk Scores:   None  For questions or updates, please contact [place] Please consult [email or web address] for contact info under    Signed, Abigail Butts, PA-C  02/26/2021 8:46 AM   I have seen and examined the patient along with Abigail Butts, PA-C .  I have reviewed the chart, notes and new data.  I agree with PA/NP's note.  Key new complaints: no angina-like complaints in > 1 month, despite being very active (golf, gardening, etc). Does describe some possible angina when on long hunting trips, always brief and spontaneously resolved. Key examination changes: normal CV exam. Tender RLQ. Key new findings / data: ECG- NSR, inferior Q waves, PRWP, no acute ischemic changes. LDL 56, HDL 35.  Small AAA (3.3 cm) and atherosclerosis in abdominal arteries but without mesenteric stenosis.  PLAN: Elderly gentleman with history of single vessel CAD, PCI- DES RCA 16 months ago still on DAPT, normal LV function, excellent functional status, here with acute appendicitis. OK to permanently DC Brilinta at this time with low risk for major CV complications with appendectomy. Important to continue uninterrupted metoprolol.  Sanda Klein, MD, [place] [phone] 02/26/2021, 9:32 AM

## 2021-02-26 NOTE — ED Notes (Signed)
Pt is pending to go to surgery today. Strict NPO order in place. Scheduled home meds not given at this time.

## 2021-02-26 NOTE — Progress Notes (Signed)
Paged [facility] for diet orders verification. Awaiting callback.

## 2021-02-26 NOTE — Consult Note (Addendum)
[facility] Medical Consultation  Robert Reilly OHY:[id number] DOB: 1941-02-25 DOA: 02/26/2021 PCP: Robert Reilly   Requesting physician: Robert Reilly Date of consultation: 02/26/2021 Reason for consultation: Medical management  Impression/Recommendations   1. Acute appendicitis: Patient presented with complaints of abdominal pain.  CT imaging significant for early appendicitis.  General surgery was formally consulted and plan on conservative measures at this time with IV antibiotics as no signs of perforation. -IV metronidazole and Rocephin  -Morphine IV as needed for pain -Per general surgery  2. Leukocytosis: WBC elevated up to 14.4.  Suspect secondary to above. -Recheck CBC in a.m.  3. Coronary artery disease: Patient with prior history of PCI to RCA in 2004 and 09/2019. -Holding Brilinta and aspirin -Continue statin  4. Hypokalemia: Acute.  Initial potassium 3.3.  Patient was placed on normal saline IV fluids with 20 mEq of potassium chloride IV running at 100 mL/h. -Continue to monitor potassium levels and adjust IV fluids as needed  5. Essential hypertension: Blood pressures currently stable. -Continue metoprolol, amlodipine, HCTZ, and ramipril  6. Gout: Patient follows with Dr. Milly Reilly rheumatology in the outpatient setting. -Continue allopurinol and colchicine  7. GERD -Continue pharmacy substitution of Protonix  8. Infrarenal abdominal aortic aneurysm: Seen on CT noted to be 33 mm diameter. -Recommend follow-up ultrasound every 3 years  9. Bladder wall thickening: Localized right lateral bladder wall thickening has not spent seen on CT. Urinalysis did not show any significant signs of infection. -Consider recommending outpatient urology follow-up to evaluate for the possibility of bladder carcinoma  I will followup again tomorrow. Please contact me if I can be of assistance in the meanwhile. Thank you for this consultation.  Chief Complaint: Abdominal  pain  HPI:  Robert Reilly is a 80 y.o. male with medical history significant of HTN, HLD, CAD s/p PCI, and gout who presents with complaints of lower abdominal pain that began yesterday evening.  Prior to onset of symptoms he had loose stool earlier that day.  After the onset abdominal pain was very nauseous, but did not vomit.  Denies any fever, shortness of breath, chest pain, or dysuria.  Labs were significant for WBC 14.4, potassium 3.3, and all other labs relatively within normal limits.  CT angiogram of the chest, abdomen, and pelvis which revealed concern for possible early appendicitis, right-sided bladder wall thickening, infrarenal AAA.  Right upper quadrant abdominal ultrasound significant for gallbladder wall thickening with no gallstones or sonographic Murphy sign to suggest acute cholecystitis.  Review of Systems: Review of Systems  Constitutional: Negative for fever.  Eyes: Negative for double vision and photophobia.  Gastrointestinal: Positive for abdominal pain and nausea. Negative for vomiting.  Otherwise a complete 10 point review of systems was performed and negative except for as noted above in HPI  Past Medical History:  Diagnosis Date  . Acute myocardial infarction of other inferior wall, subsequent episode of care   . Cancer ([place])    melenoma  back , left arm   4-5 yrs ago  . Coronary atherosclerosis of native coronary artery   . GERD (gastroesophageal reflux disease)   . Gout   . History of hiatal hernia   . Hypercholesteremia   . Hypertension   . Old MI (myocardial infarction) 09/10/2013   2004, stent to RCA  . Osteoarthritis   . Other disorder of calcium metabolism   . Tick bite 05/25/2016   states has had 5 in past month   Past Surgical History:  Procedure Laterality Date  . CARDIAC CATHETERIZATION    . CORONARY ANGIOPLASTY     stent x 1    12-15 yrs ago  . CORONARY STENT INTERVENTION N/A 10/28/2019   Procedure: CORONARY STENT INTERVENTION;  Surgeon:  Robert Man, Reilly;  Location: [facility] [place];  Service: Cardiovascular;  Laterality: N/A;  . EYE SURGERY Bilateral    cataracts  . INSERTION OF MESH N/A 05/30/2016   Procedure: INSERTION OF MESH;  Surgeon: Robert Confer, Reilly;  Location: [facility];  Service: General;  Laterality: N/A;  . LEFT HEART CATH AND CORONARY ANGIOGRAPHY N/A 10/28/2019   Procedure: LEFT HEART CATH AND CORONARY ANGIOGRAPHY;  Surgeon: Robert Man, Reilly;  Location: [place] [facility];  Service: Cardiovascular;  Laterality: N/A;  . MELANOMA EXCISION     back, left arm  . SHOULDER ARTHROSCOPY WITH ROTATOR CUFF REPAIR Left   . UMBILICAL HERNIA REPAIR N/A 05/30/2016   Procedure: UMBILICAL HERNIA REPAIR;  Surgeon: Robert Confer, Reilly;  Location: [facility];  Service: General;  Laterality: N/A;   Social History:  reports that he quit smoking about 19 years ago. His smoking use included cigarettes. He has quit using smokeless tobacco. He reports current alcohol use. He reports that he does not use drugs.  Allergies  Allergen Reactions  . Tetracyclines & Related Rash  . Other Other (See Comments)    No meat: Came from a tick bite    Family History  Problem Relation Age of Onset  . Stroke Mother   . Cancer Father     Prior to Admission medications   Medication Sig Start Date End Date Taking? Authorizing Provider  acetaminophen (TYLENOL) 500 MG tablet Take 500 mg by mouth every 6 (six) hours as needed for moderate pain or headache.   Yes Provider, Historical, Reilly  allopurinol (ZYLOPRIM) 100 MG tablet Take 100 mg by mouth 2 (two) times daily.  02/13/15  Yes Provider, Historical, Reilly  amLODipine (NORVASC) 5 MG tablet TAKE 1 TABLET BY MOUTH DAILY. Patient taking differently: Take 5 mg by mouth daily. 08/02/20  Yes Burtis Junes, NP  Ascorbic Acid (VITAMIN C) 100 MG tablet Take 100 mg by mouth daily.   Yes Provider, Historical, Reilly  aspirin EC 81 MG tablet Take 81 mg by mouth daily.   Yes Provider, Historical, Reilly  colchicine  0.6 MG tablet Take 0.6 mg by mouth every other day.    Yes Provider, Historical, Reilly  EPINEPHrine 0.3 mg/0.3 mL IJ SOAJ injection Inject 0.3 mg into the skin as needed.   Yes Provider, Historical, Reilly  Flaxseed, Linseed, (FLAX SEED OIL) 1000 MG CAPS Take 1,000 mg by mouth 2 (two) times daily.    Yes Provider, Historical, Reilly  hydrochlorothiazide (HYDRODIURIL) 25 MG tablet TAKE 1 TABLET BY MOUTH DAILY. Patient taking differently: Take 25 mg by mouth daily. 02/03/21  Yes Jerline Pain, Reilly  loratadine (CLARITIN) 10 MG tablet Take 10 mg by mouth daily as needed for itching.   Yes Provider, Historical, Reilly  metoprolol succinate (TOPROL-XL) 25 MG 24 hr tablet Take 1 tablet (25 mg total) by mouth daily. Take with or immediately following a meal. 02/18/20  Yes Gerhardt, Marlane Hatcher, NP  Multiple Vitamins-Minerals (AIRBORNE GUMMIES PO) Take 2 tablets by mouth daily.   Yes Provider, Historical, Reilly  nitroGLYCERIN (NITROSTAT) 0.4 MG SL tablet Place 1 tablet (0.4 mg total) under the tongue every 5 (five) minutes as needed for chest pain. 02/13/18  Yes Burtis Junes,  NP  omeprazole (PRILOSEC) 20 MG capsule TAKE 1 CAPSULE BY MOUTH 2 TIMES DAILY BEFORE A MEAL. Patient taking differently: Take 20 mg by mouth 2 (two) times daily before a meal. 02/03/21  Yes Jerline Pain, Reilly  ramipril (ALTACE) 10 MG capsule Take 1 capsule (10 mg total) by mouth daily. 03/24/20  Yes Burtis Junes, NP  rosuvastatin (CRESTOR) 20 MG tablet TAKE 1 TABLET BY MOUTH AT BEDTIME. Patient taking differently: Take 20 mg by mouth at bedtime. 07/06/20  Yes Burtis Junes, NP  solifenacin (VESICARE) 5 MG tablet Take 5 mg by mouth daily.   Yes Provider, Historical, Reilly  ticagrelor (BRILINTA) 90 MG TABS tablet Take 1 tablet (90 mg total) by mouth 2 (two) times daily. 08/03/20  Yes Jerline Pain, Reilly  Vitamin D, Cholecalciferol, 25 MCG (1000 UT) CAPS Take 1,000 Units by mouth daily.   Yes Provider, Historical, Reilly  zinc gluconate 50 MG tablet Take 50 mg by mouth  daily.   Yes Provider, Historical, Reilly   Physical Exam:  Constitutional: Elderly male currently in NAD, calm, comfortable Vitals:   02/26/21 0733 02/26/21 0800 02/26/21 0815 02/26/21 0830  BP:  107/64  101/80  Pulse:  64 62 62  Resp:  18 12 15   Temp: 98.1 F (36.7 C)     TempSrc: Oral     SpO2:   91% 94%  Weight:      Height:       Eyes: PERRL, lids and conjunctivae normal ENMT: Mucous membranes are moist. Posterior pharynx clear of any exudate or lesions.Normal dentition.  Neck: normal, supple, no masses, no thyromegaly Respiratory: clear to auscultation bilaterally, no wheezing, no crackles. Normal respiratory effort. No accessory muscle use.  Cardiovascular: Regular rate and rhythm, no murmurs / rubs / gallops. No extremity edema. 2+ pedal pulses. No carotid bruits.  Abdomen: Tenderness to palpation of the right lower abdomen.  On no masses palpated. No hepatosplenomegaly. Bowel sounds positive.  Musculoskeletal: no clubbing / cyanosis. No joint deformity upper and lower extremities. Good ROM, no contractures. Normal muscle tone.  Skin: no rashes, lesions, ulcers. No induration Neurologic: CN 2-12 grossly intact. Sensation intact, DTR normal. Strength 5/5 in all 4.  Psychiatric: Normal judgment and insight. Alert and oriented x 3. Normal mood.   Labs on Admission:  Basic Metabolic Panel: Recent Labs  Lab 02/26/21 0047  NA 138  K 3.3*  CL 104  CO2 24  GLUCOSE 116*  BUN 23  CREATININE 1.10  CALCIUM 9.1   Liver Function Tests: Recent Labs  Lab 02/26/21 0047  AST 20  ALT 19  ALKPHOS 56  BILITOT 0.7  PROT 7.1  ALBUMIN 3.7   Recent Labs  Lab 02/26/21 0047  LIPASE 38   No results for input(s): AMMONIA in the last 168 hours. CBC: Recent Labs  Lab 02/26/21 0047  WBC 14.4*  HGB 15.1  HCT 44.5  MCV 96.3  PLT 223   Cardiac Enzymes: No results for input(s): CKTOTAL, CKMB, CKMBINDEX, TROPONINI in the last 168 hours. BNP: Invalid input(s): POCBNP CBG: No  results for input(s): GLUCAP in the last 168 hours.  Radiological Exams on Admission: CT Angio Abd/Pel W and/or Wo Contrast  Result Date: 02/26/2021 CLINICAL DATA:  80 year old male with lower abdominal pain. Diarrhea. EXAM: CTA ABDOMEN AND PELVIS WITHOUT AND WITH CONTRAST TECHNIQUE: Multidetector CT imaging of the abdomen and pelvis was performed using the standard protocol during bolus administration of intravenous contrast. Multiplanar reconstructed images and MIPs  were obtained and reviewed to evaluate the vascular anatomy. CONTRAST:  115mL OMNIPAQUE IOHEXOL 350 MG/ML SOLN COMPARISON:  [facility] CT Abdomen and Pelvis 10/21/2012. FINDINGS: VASCULAR Aortoiliac calcified atherosclerosis. Major arterial structures including the SMA and IMA remain patent. No celiac or SMA origin stenosis. Mild infrarenal abdominal aortic aneurysm (33 mm diameter and 1.5 x the proximal aorta caliber. Mural plaque or thrombus on series 5, image 107. This is proximal to the IMA. Negative for aortic dissection. Iliac and proximal femoral arteries remain patent. Review of the MIP images confirms the above findings. NON-VASCULAR Lower chest: Centrilobular and paraseptal emphysema appears progressed since 2013 along with subpleural lung scarring. No pleural effusion or lung base consolidation. Calcified coronary artery atherosclerosis on series 6, image 7. Cardiac size at the upper limits of normal. No pericardial effusion. Hepatobiliary: Abnormal gallbladder wall with wall thickening or small volume pericholecystic fluid (series 8, image 43). No cholelithiasis evident by CT. No bile duct enlargement. Liver enhancement remains within normal limits. Pancreas: Partially atrophied, otherwise negative. Spleen: Negative. Adrenals/Urinary Tract: Normal adrenal glands. Nonobstructed kidneys with symmetric renal enhancement. No nephrolithiasis. Decompressed ureters. There is abnormal indistinct bladder wall thickening and  hyperenhancement up to 15 mm along the right lateral wall of the bladder in an area of about 5 cm. See series 5, image 203. Otherwise unremarkable urinary bladder. Stomach/Bowel: Extensive sigmoid diverticulosis. No active inflammation. Moderate diverticula in the distal descending colon. Mildly redundant transverse colon. Partially fluid-filled cecum. The appendix arises from the cecum on series 5, image 155 and is abnormally enlarged, airless and demonstrates mild surrounding inflammation. Appendix: Location: Caudal to the cecum series 5, image 155 and coronal image 71 Diameter: 11-12 mm Appendicolith: Negative Mucosal hyper-enhancement: Not well evaluated on this early contrast timed exam Extraluminal gas: Negative Periappendiceal collection: Negative, early peri appendiceal inflammatory stranding. Fluid-filled terminal ileum and other distal small bowel loops appear within normal limits. Negative stomach and duodenum. No free air. No free fluid. Lymphatic: No lymphadenopathy. Reproductive: Negative. Other: No pelvic free fluid. Musculoskeletal: No acute osseous abnormality identified. IMPRESSION: 1. Both the Gallbladder and the Appendix appear inflamed. Right Upper Quadrant Ultrasound would be valuable for further evaluation of the gallbladder. But mildly dilated and inflamed appearance of the appendix is compatible with Early Appendicitis. Recommend Surgery consultation. 2. Suspicious localized right lateral bladder wall thickening and enhancement. Recommend Urology follow-up to evaluate for possible Bladder Carcinoma. No obstructive uropathy. 3. Aortic Atherosclerosis (ICD10-I70.0) with mild Infrarenal Abdominal Aortic Aneurysm. Recommend follow-up ultrasound every 3 years. This recommendation follows ACR consensus guidelines: White Paper of the ACR Incidental Findings Committee II on Vascular Findings. J Am Coll Radiol 2013; 10:789-794. 4. Celiac, SMA and IMA are patent, with no SMA or celiac origin stenosis.  5. Calcified coronary artery atherosclerosis. Emphysema (ICD10-J43.9). Electronically Signed   By: Genevie Ann M.D.   On: 02/26/2021 06:14   US Abdomen Limited RUQ (LIVER/GB)  Result Date: 02/26/2021 CLINICAL DATA:  80 year old male with abdominal pain for 1 day. Abnormal gallbladder on earlier CT. EXAM: ULTRASOUND ABDOMEN LIMITED RIGHT UPPER QUADRANT COMPARISON:  CT Abdomen and Pelvis 0342 hours. FINDINGS: Gallbladder: Irregular gallbladder wall thickening up to 4 mm. But no echogenic sludge or stones identified within the gallbladder lumen. No pericholecystic fluid. No sonographic Murphy sign elicited. Common bile duct: Diameter: 2 mm, normal. Liver: Suboptimal visualization of the left hepatic lobe due to bowel gas. Liver echogenicity within normal limits. No discrete liver lesion. No intrahepatic biliary ductal dilatation. Portal vein is patent  on color Doppler imaging with normal direction of blood flow towards the liver. Other: Negative visible right kidney. IMPRESSION: 1. Abnormal gallbladder wall thickening, but no gallstones identified, and no sonographic Murphy sign to suggest acute cholecystitis. 2. No evidence of bile duct obstruction. Electronically Signed   By: Genevie Ann M.D.   On: 02/26/2021 07:59    EKG: Independently reviewed.  Sinus rhythm at 75 bpm with  Time spent: >45 minutes  [facility]   If 7PM-7AM, please contact night-coverage

## 2021-02-26 NOTE — ED Notes (Signed)
Pt given urinal for urination.

## 2021-02-26 NOTE — H&P (Signed)
Robert Reilly is an 80 y.o. male.   Chief Complaint: RLQ abdominal pain HPI: This is a 80 year old male with history of acute MI s/p coronary stents anticoagulated on Brilenta also s/p umbilical hernia repair with mesh who presents with acute onset of lower abdominal pain, mostly in the RLQ.  He played a round of golf yesterday morning, then had lunch of fried fish around 1:30.  He felt fine until around 8 PM 02/25/21, when he had sudden onset of RLQ abdominal pain, associated with some nausea.  No vomiting.  He did had a soft bowel movement yesterday morning.  No previous similar episodes.  He took his last dose of Brilenta 4/29 around 9 PM.  His cardiologist is Dr. Marlou Porch.  Past Medical History:  Diagnosis Date  . Acute myocardial infarction of other inferior wall, subsequent episode of care   . Cancer ([place])    melenoma  back , left arm   4-5 yrs ago  . Coronary atherosclerosis of native coronary artery   . GERD (gastroesophageal reflux disease)   . Gout   . History of hiatal hernia   . Hypercholesteremia   . Hypertension   . Old MI (myocardial infarction) 09/10/2013   2004, stent to RCA  . Osteoarthritis   . Other disorder of calcium metabolism   . Tick bite 05/25/2016   states has had 5 in past month    Past Surgical History:  Procedure Laterality Date  . CARDIAC CATHETERIZATION    . CORONARY ANGIOPLASTY     stent x 1    12-15 yrs ago  . CORONARY STENT INTERVENTION N/A 10/28/2019   Procedure: CORONARY STENT INTERVENTION;  Surgeon: Leonie Man, MD;  Location: [facility];  Service: Cardiovascular;  Laterality: N/A;  . EYE SURGERY Bilateral    cataracts  . INSERTION OF MESH N/A 05/30/2016   Procedure: INSERTION OF MESH;  Surgeon: Jackolyn Confer, MD;  Location: [facility];  Service: General;  Laterality: N/A;  . LEFT HEART CATH AND CORONARY ANGIOGRAPHY N/A 10/28/2019   Procedure: LEFT HEART CATH AND CORONARY ANGIOGRAPHY;  Surgeon: Leonie Man, MD;  Location: [facility];  Service: Cardiovascular;  Laterality: N/A;  . MELANOMA EXCISION     back, left arm  . SHOULDER ARTHROSCOPY WITH ROTATOR CUFF REPAIR Left   . UMBILICAL HERNIA REPAIR N/A 05/30/2016   Procedure: UMBILICAL HERNIA REPAIR;  Surgeon: Jackolyn Confer, MD;  Location: [facility];  Service: General;  Laterality: N/A;    Family History  Problem Relation Age of Onset  . Stroke Mother   . Cancer Father    Social History:  reports that he quit smoking about 19 years ago. His smoking use included cigarettes. He has quit using smokeless tobacco. He reports current alcohol use. He reports that he does not use drugs.  Allergies:  Allergies  Allergen Reactions  . Tetracyclines & Related Rash  . Other Other (See Comments)    No meat: Came from a tick bite     Prior to Admission medications   Medication Sig Start Date End Date Taking? Authorizing Provider  acetaminophen (TYLENOL) 500 MG tablet Take 500 mg by mouth every 6 (six) hours as needed for moderate pain or headache.   Yes [provider]  allopurinol (ZYLOPRIM) 100 MG tablet Take 100 mg by mouth 2 (two) times daily.  02/13/15  Yes [provider]  amLODipine (NORVASC) 5 MG tablet TAKE 1 TABLET BY MOUTH DAILY. Patient  taking differently: Take 5 mg by mouth daily. 08/02/20  Yes Burtis Junes, NP  Ascorbic Acid (VITAMIN C) 100 MG tablet Take 100 mg by mouth daily.   Yes [provider]  aspirin EC 81 MG tablet Take 81 mg by mouth daily.   Yes [provider]  colchicine 0.6 MG tablet Take 0.6 mg by mouth every other day.    Yes [provider]  EPINEPHrine 0.3 mg/0.3 mL IJ SOAJ injection Inject 0.3 mg into the skin as needed.   Yes [provider]  Flaxseed, Linseed, (FLAX SEED OIL) 1000 MG CAPS Take 1,000 mg by mouth 2 (two) times daily.    Yes [provider]  hydrochlorothiazide (HYDRODIURIL) 25 MG tablet TAKE 1 TABLET BY MOUTH DAILY. Patient taking differently: Take 25  mg by mouth daily. 02/03/21  Yes Jerline Pain, MD  loratadine (CLARITIN) 10 MG tablet Take 10 mg by mouth daily as needed for itching.   Yes [provider]  metoprolol succinate (TOPROL-XL) 25 MG 24 hr tablet Take 1 tablet (25 mg total) by mouth daily. Take with or immediately following a meal. 02/18/20  Yes Gerhardt, Marlane Hatcher, NP  Multiple Vitamins-Minerals (AIRBORNE GUMMIES PO) Take 2 tablets by mouth daily.   Yes [provider]  nitroGLYCERIN (NITROSTAT) 0.4 MG SL tablet Place 1 tablet (0.4 mg total) under the tongue every 5 (five) minutes as needed for chest pain. 02/13/18  Yes Burtis Junes, NP  omeprazole (PRILOSEC) 20 MG capsule TAKE 1 CAPSULE BY MOUTH 2 TIMES DAILY BEFORE A MEAL. Patient taking differently: Take 20 mg by mouth 2 (two) times daily before a meal. 02/03/21  Yes Jerline Pain, MD  ramipril (ALTACE) 10 MG capsule Take 1 capsule (10 mg total) by mouth daily. 03/24/20  Yes Burtis Junes, NP  rosuvastatin (CRESTOR) 20 MG tablet TAKE 1 TABLET BY MOUTH AT BEDTIME. Patient taking differently: Take 20 mg by mouth at bedtime. 07/06/20  Yes Burtis Junes, NP  solifenacin (VESICARE) 5 MG tablet Take 5 mg by mouth daily.   Yes [provider]  ticagrelor (BRILINTA) 90 MG TABS tablet Take 1 tablet (90 mg total) by mouth 2 (two) times daily. 08/03/20  Yes Jerline Pain, MD  Vitamin D, Cholecalciferol, 25 MCG (1000 UT) CAPS Take 1,000 Units by mouth daily.   Yes [provider]  zinc gluconate 50 MG tablet Take 50 mg by mouth daily.   Yes [provider]     Results for orders placed or performed during the hospital encounter of 02/26/21 (from the past 48 hour(s))  Lipase, blood     Status: None   Collection Time: 02/26/21 12:47 AM  Result Value Ref Range   Lipase 38 11 - 51 U/L    Comment: Performed at [facility], [place] [address] [place], [place] [address]  Comprehensive metabolic panel     Status: Abnormal   Collection Time:  02/26/21 12:47 AM  Result Value Ref Range   Sodium 138 135 - 145 mmol/L   Potassium 3.3 (L) 3.5 - 5.1 mmol/L   Chloride 104 98 - 111 mmol/L   CO2 24 22 - 32 mmol/L   Glucose, Bld 116 (H) 70 - 99 mg/dL    Comment: Glucose reference range applies only to samples taken after fasting for at least 8 hours.   BUN 23 8 - 23 mg/dL   Creatinine, Ser 1.10 0.61 - 1.24 mg/dL   Calcium 9.1  8.9 - 10.3 mg/dL   Total Protein 7.1 6.5 - 8.1 g/dL   Albumin 3.7 3.5 - 5.0 g/dL   AST 20 15 - 41 U/L   ALT 19 0 - 44 U/L   Alkaline Phosphatase 56 38 - 126 U/L   Total Bilirubin 0.7 0.3 - 1.2 mg/dL   GFR, Estimated >60 >60 mL/min    Comment: (NOTE) Calculated using the CKD-EPI Creatinine Equation (2021)    Anion gap 10 5 - 15    Comment: Performed at [facility] [address] [place], [place] [address]  CBC     Status: Abnormal   Collection Time: 02/26/21 12:47 AM  Result Value Ref Range   WBC 14.4 (H) 4.0 - 10.5 K/uL   RBC 4.62 4.22 - 5.81 MIL/uL   Hemoglobin 15.1 13.0 - 17.0 g/dL   HCT 44.5 39.0 - 52.0 %   MCV 96.3 80.0 - 100.0 fL   MCH 32.7 26.0 - 34.0 pg   MCHC 33.9 30.0 - 36.0 g/dL   RDW 14.8 11.5 - 15.5 %   Platelets 223 150 - 400 K/uL   nRBC 0.0 0.0 - 0.2 %    Comment: Performed at [facility], [place] [address] [place], [place] [address]  ABO/Rh     Status: None   Collection Time: 02/26/21 12:47 AM  Result Value Ref Range   ABO/RH(D)      O POS Performed at [facility] [address] [place], [place] [address]   Urinalysis, Routine w reflex microscopic Urine, Clean Catch     Status: Abnormal   Collection Time: 02/26/21 12:54 AM  Result Value Ref Range   Color, Urine STRAW (A) YELLOW   APPearance CLEAR CLEAR   Specific Gravity, Urine 1.009 1.005 - 1.030   pH 8.0 5.0 - 8.0   Glucose, UA NEGATIVE NEGATIVE mg/dL   Hgb urine dipstick SMALL (A) NEGATIVE   Bilirubin Urine NEGATIVE NEGATIVE   Ketones, ur NEGATIVE NEGATIVE mg/dL   Protein, ur 30 (A) NEGATIVE mg/dL    Nitrite NEGATIVE NEGATIVE   Leukocytes,Ua NEGATIVE NEGATIVE   RBC / HPF 0-5 0 - 5 RBC/hpf   WBC, UA 0-5 0 - 5 WBC/hpf   Bacteria, UA NONE SEEN NONE SEEN    Comment: Performed at [facility], [place] [address] [place], [place] [address]  Lactic acid, plasma     Status: None   Collection Time: 02/26/21  3:15 AM  Result Value Ref Range   Lactic Acid, Venous 1.6 0.5 - 1.9 mmol/L    Comment: Performed at [facility] [address], [place], [place] [address]  Protime-INR     Status: None   Collection Time: 02/26/21  6:54 AM  Result Value Ref Range   Prothrombin Time 13.3 11.4 - 15.2 seconds   INR 1.0 0.8 - 1.2    Comment: (NOTE) INR goal varies based on device and disease states. Performed at [facility], [place] [address], [place], [place] [address]   APTT     Status: None   Collection Time: 02/26/21  6:54 AM  Result Value Ref Range   aPTT 27 24 - 36 seconds    Comment: Performed at [facility] [address], [place], [place] [address]  Type and screen [place]     Status: None   Collection Time: 02/26/21  6:55 AM  Result Value Ref Range   ABO/RH(D) Jenetta Downer  POS    Antibody Screen NEG    Sample Expiration      03/01/2021,2359 Performed at [facility], [place] [address] [place], [place] [address]    CT Angio Abd/Pel W and/or Wo Contrast  Result Date: 02/26/2021 CLINICAL DATA:  80 year old male with lower abdominal pain. Diarrhea. EXAM: CTA ABDOMEN AND PELVIS WITHOUT AND WITH CONTRAST TECHNIQUE: Multidetector CT imaging of the abdomen and pelvis was performed using the standard protocol during bolus administration of intravenous contrast. Multiplanar reconstructed images and MIPs were obtained and reviewed to evaluate the vascular anatomy. CONTRAST:  17mL OMNIPAQUE IOHEXOL 350 MG/ML SOLN COMPARISON:  [facility] CT Abdomen and Pelvis 10/21/2012. FINDINGS: VASCULAR Aortoiliac calcified atherosclerosis. Major arterial  structures including the SMA and IMA remain patent. No celiac or SMA origin stenosis. Mild infrarenal abdominal aortic aneurysm (33 mm diameter and 1.5 x the proximal aorta caliber. Mural plaque or thrombus on series 5, image 107. This is proximal to the IMA. Negative for aortic dissection. Iliac and proximal femoral arteries remain patent. Review of the MIP images confirms the above findings. NON-VASCULAR Lower chest: Centrilobular and paraseptal emphysema appears progressed since 2013 along with subpleural lung scarring. No pleural effusion or lung base consolidation. Calcified coronary artery atherosclerosis on series 6, image 7. Cardiac size at the upper limits of normal. No pericardial effusion. Hepatobiliary: Abnormal gallbladder wall with wall thickening or small volume pericholecystic fluid (series 8, image 43). No cholelithiasis evident by CT. No bile duct enlargement. Liver enhancement remains within normal limits. Pancreas: Partially atrophied, otherwise negative. Spleen: Negative. Adrenals/Urinary Tract: Normal adrenal glands. Nonobstructed kidneys with symmetric renal enhancement. No nephrolithiasis. Decompressed ureters. There is abnormal indistinct bladder wall thickening and hyperenhancement up to 15 mm along the right lateral wall of the bladder in an area of about 5 cm. See series 5, image 203. Otherwise unremarkable urinary bladder. Stomach/Bowel: Extensive sigmoid diverticulosis. No active inflammation. Moderate diverticula in the distal descending colon. Mildly redundant transverse colon. Partially fluid-filled cecum. The appendix arises from the cecum on series 5, image 155 and is abnormally enlarged, airless and demonstrates mild surrounding inflammation. Appendix: Location: Caudal to the cecum series 5, image 155 and coronal image 71 Diameter: 11-12 mm Appendicolith: Negative Mucosal hyper-enhancement: Not well evaluated on this early contrast timed exam Extraluminal gas: Negative  Periappendiceal collection: Negative, early peri appendiceal inflammatory stranding. Fluid-filled terminal ileum and other distal small bowel loops appear within normal limits. Negative stomach and duodenum. No free air. No free fluid. Lymphatic: No lymphadenopathy. Reproductive: Negative. Other: No pelvic free fluid. Musculoskeletal: No acute osseous abnormality identified. IMPRESSION: 1. Both the Gallbladder and the Appendix appear inflamed. Right Upper Quadrant Ultrasound would be valuable for further evaluation of the gallbladder. But mildly dilated and inflamed appearance of the appendix is compatible with Early Appendicitis. Recommend Surgery consultation. 2. Suspicious localized right lateral bladder wall thickening and enhancement. Recommend Urology follow-up to evaluate for possible Bladder Carcinoma. No obstructive uropathy. 3. Aortic Atherosclerosis (ICD10-I70.0) with mild Infrarenal Abdominal Aortic Aneurysm. Recommend follow-up ultrasound every 3 years. This recommendation follows ACR consensus guidelines: White Paper of the ACR Incidental Findings Committee II on Vascular Findings. J Am Coll Radiol 2013; 10:789-794. 4. Celiac, SMA and IMA are patent, with no SMA or celiac origin stenosis. 5. Calcified coronary artery atherosclerosis. Emphysema (ICD10-J43.9). Electronically Signed   By: Genevie Ann M.D.   On: 02/26/2021 06:14   US Abdomen Limited RUQ (LIVER/GB)  Result Date: 02/26/2021 CLINICAL DATA:  80 year old male with abdominal  pain for 1 day. Abnormal gallbladder on earlier CT. EXAM: ULTRASOUND ABDOMEN LIMITED RIGHT UPPER QUADRANT COMPARISON:  CT Abdomen and Pelvis 0342 hours. FINDINGS: Gallbladder: Irregular gallbladder wall thickening up to 4 mm. But no echogenic sludge or stones identified within the gallbladder lumen. No pericholecystic fluid. No sonographic Murphy sign elicited. Common bile duct: Diameter: 2 mm, normal. Liver: Suboptimal visualization of the left hepatic lobe due to bowel  gas. Liver echogenicity within normal limits. No discrete liver lesion. No intrahepatic biliary ductal dilatation. Portal vein is patent on color Doppler imaging with normal direction of blood flow towards the liver. Other: Negative visible right kidney. IMPRESSION: 1. Abnormal gallbladder wall thickening, but no gallstones identified, and no sonographic Murphy sign to suggest acute cholecystitis. 2. No evidence of bile duct obstruction. Electronically Signed   By: Genevie Ann M.D.   On: 02/26/2021 07:59    Review of Systems  HENT: Negative for ear discharge, ear pain, hearing loss and tinnitus.   Eyes: Negative for photophobia and pain.  Respiratory: Negative for cough and shortness of breath.   Cardiovascular: Negative for chest pain.  Gastrointestinal: Positive for abdominal pain and nausea. Negative for vomiting.  Genitourinary: Negative for dysuria, flank pain, frequency and urgency.  Musculoskeletal: Negative for back pain, myalgias and neck pain.  Neurological: Negative for dizziness and headaches.  Hematological: Does not bruise/bleed easily.  Psychiatric/Behavioral: The patient is not nervous/anxious.     Blood pressure 109/73, pulse 68, temperature 98.1 F (36.7 C), temperature source Oral, resp. rate 16, height 5\' 8"  (1.727 m), weight 82.6 kg, SpO2 99 %. Physical Exam  Constitutional:  WDWN in NAD, conversant, no obvious deformities; lying in bed comfortably Eyes:  Pupils equal, round; sclera anicteric; moist conjunctiva; no lid lag HENT:  Oral mucosa moist; good dentition  Neck:  No masses palpated, trachea midline; no thyromegaly Lungs:  CTA bilaterally; normal respiratory effort CV:  Regular rate and rhythm; no murmurs; extremities well-perfused with no edema Abd:  +bowel sounds, soft, mildly distended, tender in lower abdomen, mostly on the right; no rebound tenderness; no palpable organomegaly; healed umbilical incision Musc:  Unable to assess gait; no apparent clubbing or  cyanosis in extremities Lymphatic:  No palpable cervical or axillary lymphadenopathy Skin:  Warm, dry; no sign of jaundice Psychiatric - alert and oriented x 4; calm mood and affect   Assessment/Plan Acute appendicitis -no sign of perforation Coronary artery disease - on Brilenta Rheumatoid arthritis Hypertension Hyperlipidemia  Will attempt non-operative management of acute appendicitis due to anticoagulation Hold ASA/ Brilenta in case non-op management is unsuccessful and he needs to have surgery Consult [facility] and Cardiology to manage his non-surgical issues and to clear him for possible laparoscopic appendectomy.  Admit to Telemetry   Maia Petties, MD 02/26/2021, 8:25 AM

## 2021-02-26 NOTE — Plan of Care (Signed)
Patient remains NPO. Problem: Education: Goal: Knowledge of General Education information will improve Description: Including pain rating scale, medication(s)/side effects and non-pharmacologic comfort measures Outcome: Progressing   Problem: Health Behavior/Discharge Planning: Goal: Ability to manage health-related needs will improve Outcome: Progressing   Problem: Clinical Measurements: Goal: Ability to maintain clinical measurements within normal limits will improve Outcome: Progressing Goal: Will remain free from infection Outcome: Progressing Goal: Diagnostic test results will improve Outcome: Progressing Goal: Respiratory complications will improve Outcome: Progressing Goal: Cardiovascular complication will be avoided Outcome: Progressing   Problem: Activity: Goal: Risk for activity intolerance will decrease Outcome: Progressing   Problem: Nutrition: Goal: Adequate nutrition will be maintained Outcome: Not Progressing   Problem: Coping: Goal: Level of anxiety will decrease Outcome: Progressing   Problem: Elimination: Goal: Will not experience complications related to bowel motility Outcome: Progressing Goal: Will not experience complications related to urinary retention Outcome: Progressing   Problem: Pain Managment: Goal: General experience of comfort will improve Outcome: Progressing   Problem: Safety: Goal: Ability to remain free from injury will improve Outcome: Progressing   Problem: Skin Integrity: Goal: Risk for impaired skin integrity will decrease Outcome: Progressing

## 2021-02-26 NOTE — Plan of Care (Signed)

## 2021-02-26 NOTE — ED Provider Notes (Signed)
[facility] Emergency Department Provider Note MRN:  [id number]  Arrival date & time: 02/26/21     Chief Complaint   Abdominal Pain   History of Present Illness   Robert Reilly is a 80 y.o. year-old male with a history of CAD presenting to the ED with chief complaint of abdominal.  Severe abdominal pain beginning at 8 PM, progressively worsening and constant.  Lower abdomen.  Associated with nausea but no vomiting.  No exacerbating or alleviating factors.  No chest pain or shortness of breath.  No numbness or weakness to the arms or legs.  Review of Systems  A complete 10 system review of systems was obtained and all systems are negative except as noted in the HPI and PMH.   Patient's Health History    Past Medical History:  Diagnosis Date  . Acute myocardial infarction of other inferior wall, subsequent episode of care   . Cancer ([place])    melenoma  back , left arm   4-5 yrs ago  . Coronary atherosclerosis of native coronary artery   . GERD (gastroesophageal reflux disease)   . Gout   . History of hiatal hernia   . Hypercholesteremia   . Hypertension   . Old MI (myocardial infarction) 09/10/2013   2004, stent to RCA  . Osteoarthritis   . Other disorder of calcium metabolism   . Tick bite 05/25/2016   states has had 5 in past month    Past Surgical History:  Procedure Laterality Date  . CARDIAC CATHETERIZATION    . CORONARY ANGIOPLASTY     stent x 1    12-15 yrs ago  . CORONARY STENT INTERVENTION N/A 10/28/2019   Procedure: CORONARY STENT INTERVENTION;  Surgeon: Leonie Man, MD;  Location: [facility];  Service: Cardiovascular;  Laterality: N/A;  . EYE SURGERY Bilateral    cataracts  . INSERTION OF MESH N/A 05/30/2016   Procedure: INSERTION OF MESH;  Surgeon: Jackolyn Confer, MD;  Location: [facility];  Service: General;  Laterality: N/A;  . LEFT HEART CATH AND CORONARY ANGIOGRAPHY N/A 10/28/2019   Procedure: LEFT HEART CATH AND CORONARY  ANGIOGRAPHY;  Surgeon: Leonie Man, MD;  Location: [facility];  Service: Cardiovascular;  Laterality: N/A;  . MELANOMA EXCISION     back, left arm  . SHOULDER ARTHROSCOPY WITH ROTATOR CUFF REPAIR Left   . UMBILICAL HERNIA REPAIR N/A 05/30/2016   Procedure: UMBILICAL HERNIA REPAIR;  Surgeon: Jackolyn Confer, MD;  Location: [facility];  Service: General;  Laterality: N/A;    Family History  Problem Relation Age of Onset  . Stroke Mother   . Cancer Father     Social History   Socioeconomic History  . Marital status: Married    Spouse name: Not on file  . Number of children: Not on file  . Years of education: 64  . Highest education level: Master's degree (e.g., MA, MS, MEng, MEd, MSW, MBA)  Occupational History  . Not on file  Tobacco Use  . Smoking status: Former Smoker    Types: Cigarettes    Quit date: 05/25/2001    Years since quitting: 19.7  . Smokeless tobacco: Former Network engineer and Sexual Activity  . Alcohol use: Yes    Comment: 2-3 drinks week- gin  . Drug use: No  . Sexual activity: Not on file  Other Topics Concern  . Not on file  Social History Narrative  . Not on file  Social Determinants of Health   Financial Resource Strain: Not on file  Food Insecurity: Not on file  Transportation Needs: Not on file  Physical Activity: Not on file  Stress: Not on file  Social Connections: Not on file  Intimate Partner Violence: Not on file     Physical Exam   Vitals:   02/26/21 0600 02/26/21 0630  BP: 125/67 120/69  Pulse: 67 69  Resp: 18 18  Temp:    SpO2: 93% 96%    CONSTITUTIONAL: Well-appearing, NAD NEURO:  Alert and oriented x 3, no focal deficits EYES:  eyes equal and reactive ENT/NECK:  no LAD, no JVD CARDIO: Regular rate, well-perfused, normal S1 and S2 PULM:  CTAB no wheezing or rhonchi GI/GU:  normal bowel sounds, non-distended, non-tender MSK/SPINE:  No gross deformities, no edema SKIN:  no rash, atraumatic PSYCH:  Appropriate  speech and behavior  *Additional and/or pertinent findings included in MDM below  Diagnostic and Interventional Summary    EKG Interpretation  Date/Time:  Saturday February 26 2021 00:45:56 EDT Ventricular Rate:  75 PR Interval:  160 QRS Duration: 84 QT Interval:  398 QTC Calculation: 444 R Axis:   104 Text Interpretation: Normal sinus rhythm Rightward axis Possible Anterior infarct , age undetermined Abnormal ECG Confirmed by Gerlene Fee [id number]) on 02/26/2021 3:05:16 AM      Labs Reviewed  COMPREHENSIVE METABOLIC PANEL - Abnormal; Notable for the following components:      Result Value   Potassium 3.3 (*)    Glucose, Bld 116 (*)    All other components within normal limits  CBC - Abnormal; Notable for the following components:   WBC 14.4 (*)    All other components within normal limits  URINALYSIS, ROUTINE W REFLEX MICROSCOPIC - Abnormal; Notable for the following components:   Color, Urine STRAW (*)    Hgb urine dipstick SMALL (*)    Protein, ur 30 (*)    All other components within normal limits  RESP PANEL BY RT-PCR (FLU A&B, COVID) ARPGX2  LIPASE, BLOOD  LACTIC ACID, PLASMA    CT Angio Abd/Pel W and/or Wo Contrast  Final Result    US Abdomen Limited RUQ (LIVER/GB)    (Results Pending)    Medications  piperacillin-tazobactam (ZOSYN) IVPB 3.375 g (has no administration in time range)  sodium chloride 0.9 % bolus 1,000 mL (0 mLs Intravenous Stopped 02/26/21 0459)  HYDROmorphone (DILAUDID) injection 1 mg (1 mg Intravenous Given 02/26/21 0315)  iohexol (OMNIPAQUE) 350 MG/ML injection 100 mL (100 mLs Intravenous Contrast Given 02/26/21 0351)     Procedures  /  Critical Care .Critical Care Performed by: Maudie Flakes, MD Authorized by: Maudie Flakes, MD   Critical care provider statement:    Critical care time (minutes):  35   Critical care was necessary to treat or prevent imminent or life-threatening deterioration of the following conditions: Acute  appendicitis.   Critical care was time spent personally by me on the following activities:  Discussions with consultants, evaluation of patient's response to treatment, examination of patient, ordering and performing treatments and interventions, ordering and review of laboratory studies, ordering and review of radiographic studies, pulse oximetry, re-evaluation of patient's condition, obtaining history from patient or surrogate and review of old charts    ED Course and Medical Decision Making  I have reviewed the triage vital signs, the nursing notes, and pertinent available records from the EMR.  Listed above are laboratory and imaging tests that I personally ordered, reviewed,  and interpreted and then considered in my medical decision making (see below for details).  Pain out of proportion, patient having a lot of pain but really no tenderness on exam.  History of atherosclerotic disease, will CTA abdomen to exclude mesenteric ischemia.     CT imaging reveals likely early appendicitis.  There is also some inflammation of the gallbladder, will follow up with right upper quadrant ultrasound.  General surgery is consulted for admission, Dr. Donne Hazel is the oncoming surgeon and will evaluate the patient for admission.  Barth Kirks. Sedonia Small, [place] [email or web address]  Final Clinical Impressions(s) / ED Diagnoses     ICD-10-CM   1. Acute appendicitis, unspecified acute appendicitis type  K35.80   2. Abdominal pain  R10.9 US Abdomen Limited RUQ (LIVER/GB)    US Abdomen Limited RUQ (LIVER/GB)    ED Discharge Orders    None       Discharge Instructions Discussed with and Provided to Patient:   Discharge Instructions   None       Maudie Flakes, MD 02/26/21 [phone]

## 2021-02-26 NOTE — ED Notes (Signed)
Patient transported to CT 

## 2021-02-26 NOTE — ED Triage Notes (Addendum)
Pt c/o severe lower abd pain that began 3-4 hours ago. C/o nausea, no vomiting. Last BM was 22:00 02/25/2021. States it was like liquid.

## 2021-02-27 DIAGNOSIS — I2510 Atherosclerotic heart disease of native coronary artery without angina pectoris: Secondary | ICD-10-CM

## 2021-02-27 DIAGNOSIS — E876 Hypokalemia: Secondary | ICD-10-CM

## 2021-02-27 DIAGNOSIS — K219 Gastro-esophageal reflux disease without esophagitis: Secondary | ICD-10-CM

## 2021-02-27 LAB — CBC
HCT: 41.2 % (ref 39.0–52.0)
Hemoglobin: 13.6 g/dL (ref 13.0–17.0)
MCH: 32.5 pg (ref 26.0–34.0)
MCHC: 33 g/dL (ref 30.0–36.0)
MCV: 98.3 fL (ref 80.0–100.0)
Platelets: 196 10*3/uL (ref 150–400)
RBC: 4.19 MIL/uL — ABNORMAL LOW (ref 4.22–5.81)
RDW: 15.1 % (ref 11.5–15.5)
WBC: 10.8 10*3/uL — ABNORMAL HIGH (ref 4.0–10.5)
nRBC: 0 % (ref 0.0–0.2)

## 2021-02-27 LAB — BASIC METABOLIC PANEL
Anion gap: 10 (ref 5–15)
BUN: 15 mg/dL (ref 8–23)
CO2: 25 mmol/L (ref 22–32)
Calcium: 8.5 mg/dL — ABNORMAL LOW (ref 8.9–10.3)
Chloride: 105 mmol/L (ref 98–111)
Creatinine, Ser: 1.2 mg/dL (ref 0.61–1.24)
GFR, Estimated: >60 mL/min (ref >60)
Glucose, Bld: 89 mg/dL (ref 70–99)
Potassium: 3.3 mmol/L — ABNORMAL LOW (ref 3.5–5.1)
Sodium: 140 mmol/L (ref 135–145)

## 2021-02-27 LAB — MAGNESIUM: Magnesium: 1.9 mg/dL (ref 1.7–2.4)

## 2021-02-27 LAB — SURGICAL PCR SCREEN
MRSA, PCR: NEGATIVE
Staphylococcus aureus: POSITIVE — AB

## 2021-02-27 LAB — PHOSPHORUS: Phosphorus: 2.8 mg/dL (ref 2.5–4.6)

## 2021-02-27 MED ORDER — POTASSIUM CHLORIDE 10 MEQ/100ML IV SOLN
10.0000 meq | INTRAVENOUS | Status: AC
  Administered 2021-02-27 (×2): 10 meq via INTRAVENOUS
  Filled 2021-02-27 (×2): qty 100

## 2021-02-27 NOTE — Progress Notes (Signed)
PROGRESS NOTE    Robert Reilly  WUJ:[id number] DOB: 28-Jun-1941 DOA: 02/26/2021 PCP: Alroy Dust, L.Marlou Sa, MD    Brief Narrative:  Robert Reilly is a 80 y.o. male with medical history significant of HTN, HLD, CAD s/p PCI, and gout who presents with complaints of lower abdominal pain that began yesterday evening.  Prior to onset of symptoms he had loose stool earlier that day.  After the onset abdominal pain was very nauseous, but did not vomit.  Denies any fever, shortness of breath, chest pain, or dysuria.  Labs were significant for WBC 14.4, potassium 3.3, and all other labs relatively within normal limits.  CT angiogram of the chest, abdomen, and pelvis which revealed concern for possible early appendicitis, right-sided bladder wall thickening, infrarenal AAA.  Right upper quadrant abdominal ultrasound significant for gallbladder wall thickening with no gallstones or sonographic Murphy sign to suggest acute cholecystitis.  TRH consulted for medical management      Subjective: No complaints this am. Pain is little better. Reports rather have surgery . No n/v  Objective: Vitals:   02/26/21 1134 02/26/21 1201 02/26/21 2008 02/27/21 0407  BP:  119/68 131/74 113/70  Pulse:  (!) 59 67 64  Resp:  18 17 16   Temp: 98.2 F (36.8 C) 97.8 F (36.6 C) 98.3 F (36.8 C) 98 F (36.7 C)  TempSrc: Oral Oral Oral Oral  SpO2:  99% 94% 96%  Weight:      Height:        Intake/Output Summary (Last 24 hours) at 02/27/2021 0936 Last data filed at 02/27/2021 0700 Gross per 24 hour  Intake 945.04 ml  Output 2250 ml  Net -1304.96 ml   Filed Weights   02/26/21 0038  Weight: 82.6 kg    Examination:  General exam: Appears calm and comfortable  Respiratory system: Clear to auscultation. Respiratory effort normal. Cardiovascular system: S1 & S2 heard, RRR.  No gallops  Gastrointestinal system: Abdomen is nondistended, soft and mild tenderness at right lower quadrant normal bowel sounds heard. Central  nervous system: Alert and oriented.  Grossly intact Extremities: No edema Skin: Warm dry Psychiatry: Judgement and insight appear normal. Mood & affect appropriate.     Data Reviewed: I have personally reviewed following labs and imaging studies  CBC: Recent Labs  Lab 02/26/21 0047 02/27/21 0130  WBC 14.4* 10.8*  HGB 15.1 13.6  HCT 44.5 41.2  MCV 96.3 98.3  PLT 223 956   Basic Metabolic Panel: Recent Labs  Lab 02/26/21 0047 02/27/21 0130  NA 138 140  K 3.3* 3.3*  CL 104 105  CO2 24 25  GLUCOSE 116* 89  BUN 23 15  CREATININE 1.10 1.20  CALCIUM 9.1 8.5*  MG  --  1.9  PHOS  --  2.8   GFR: Estimated Creatinine Clearance: 53.2 mL/min (by C-G formula based on SCr of 1.2 mg/dL). Liver Function Tests: Recent Labs  Lab 02/26/21 0047  AST 20  ALT 19  ALKPHOS 56  BILITOT 0.7  PROT 7.1  ALBUMIN 3.7   Recent Labs  Lab 02/26/21 0047  LIPASE 38   No results for input(s): AMMONIA in the last 168 hours. Coagulation Profile: Recent Labs  Lab 02/26/21 0654  INR 1.0   Cardiac Enzymes: No results for input(s): CKTOTAL, CKMB, CKMBINDEX, TROPONINI in the last 168 hours. BNP (last 3 results) No results for input(s): PROBNP in the last 8760 hours. HbA1C: No results for input(s): HGBA1C in the last 72 hours. CBG: No results for  input(s): GLUCAP in the last 168 hours. Lipid Profile: No results for input(s): CHOL, HDL, LDLCALC, TRIG, CHOLHDL, LDLDIRECT in the last 72 hours. Thyroid Function Tests: No results for input(s): TSH, T4TOTAL, FREET4, T3FREE, THYROIDAB in the last 72 hours. Anemia Panel: No results for input(s): VITAMINB12, FOLATE, FERRITIN, TIBC, IRON, RETICCTPCT in the last 72 hours. Sepsis Labs: Recent Labs  Lab 02/26/21 0315  LATICACIDVEN 1.6    Recent Results (from the past 240 hour(s))  Resp Panel by RT-PCR (Flu A&B, Covid) Nasopharyngeal Swab     Status: None   Collection Time: 02/26/21  6:48 AM   Specimen: Nasopharyngeal Swab;  Nasopharyngeal(NP) swabs in vial transport medium  Result Value Ref Range Status   SARS Coronavirus 2 by RT PCR NEGATIVE NEGATIVE Final    Comment: (NOTE) SARS-CoV-2 target nucleic acids are NOT DETECTED.  The SARS-CoV-2 RNA is generally detectable in upper respiratory specimens during the acute phase of infection. The lowest concentration of SARS-CoV-2 viral copies this assay can detect is 138 copies/mL. A negative result does not preclude SARS-Cov-2 infection and should not be used as the sole basis for treatment or other patient management decisions. A negative result may occur with  improper specimen collection/handling, submission of specimen other than nasopharyngeal swab, presence of viral mutation(s) within the areas targeted by this assay, and inadequate number of viral copies(<138 copies/mL). A negative result must be combined with clinical observations, patient history, and epidemiological information. The expected result is Negative.  Fact Sheet for Patients:  EntrepreneurPulse.com.au  Fact Sheet for Healthcare Providers:  IncredibleEmployment.be  This test is no t yet approved or cleared by the [place] FDA and  has been authorized for detection and/or diagnosis of SARS-CoV-2 by FDA under an Emergency Use Authorization (EUA). This EUA will remain  in effect (meaning this test can be used) for the duration of the COVID-19 declaration under Section 564(b)(1) of the Act, 21 U.S.C.section 360bbb-3(b)(1), unless the authorization is terminated  or revoked sooner.       Influenza A by PCR NEGATIVE NEGATIVE Final   Influenza B by PCR NEGATIVE NEGATIVE Final    Comment: (NOTE) The Xpert Xpress SARS-CoV-2/FLU/RSV plus assay is intended as an aid in the diagnosis of influenza from Nasopharyngeal swab specimens and should not be used as a sole basis for treatment. Nasal washings and aspirates are unacceptable for Xpert Xpress  SARS-CoV-2/FLU/RSV testing.  Fact Sheet for Patients: EntrepreneurPulse.com.au  Fact Sheet for Healthcare Providers: IncredibleEmployment.be  This test is not yet approved or cleared by the [place] FDA and has been authorized for detection and/or diagnosis of SARS-CoV-2 by FDA under an Emergency Use Authorization (EUA). This EUA will remain in effect (meaning this test can be used) for the duration of the COVID-19 declaration under Section 564(b)(1) of the Act, 21 U.S.C. section 360bbb-3(b)(1), unless the authorization is terminated or revoked.  Performed at [facility], [place] [address] [place], [place] [address]          Radiology Studies: CT Angio Abd/Pel W and/or Wo Contrast  Result Date: 02/26/2021 CLINICAL DATA:  80 year old male with lower abdominal pain. Diarrhea. EXAM: CTA ABDOMEN AND PELVIS WITHOUT AND WITH CONTRAST TECHNIQUE: Multidetector CT imaging of the abdomen and pelvis was performed using the standard protocol during bolus administration of intravenous contrast. Multiplanar reconstructed images and MIPs were obtained and reviewed to evaluate the vascular anatomy. CONTRAST:  146mL OMNIPAQUE IOHEXOL 350 MG/ML SOLN COMPARISON:  [facility] CT Abdomen and Pelvis 10/21/2012. FINDINGS:  VASCULAR Aortoiliac calcified atherosclerosis. Major arterial structures including the SMA and IMA remain patent. No celiac or SMA origin stenosis. Mild infrarenal abdominal aortic aneurysm (33 mm diameter and 1.5 x the proximal aorta caliber. Mural plaque or thrombus on series 5, image 107. This is proximal to the IMA. Negative for aortic dissection. Iliac and proximal femoral arteries remain patent. Review of the MIP images confirms the above findings. NON-VASCULAR Lower chest: Centrilobular and paraseptal emphysema appears progressed since 2013 along with subpleural lung scarring. No pleural effusion or lung base consolidation.  Calcified coronary artery atherosclerosis on series 6, image 7. Cardiac size at the upper limits of normal. No pericardial effusion. Hepatobiliary: Abnormal gallbladder wall with wall thickening or small volume pericholecystic fluid (series 8, image 43). No cholelithiasis evident by CT. No bile duct enlargement. Liver enhancement remains within normal limits. Pancreas: Partially atrophied, otherwise negative. Spleen: Negative. Adrenals/Urinary Tract: Normal adrenal glands. Nonobstructed kidneys with symmetric renal enhancement. No nephrolithiasis. Decompressed ureters. There is abnormal indistinct bladder wall thickening and hyperenhancement up to 15 mm along the right lateral wall of the bladder in an area of about 5 cm. See series 5, image 203. Otherwise unremarkable urinary bladder. Stomach/Bowel: Extensive sigmoid diverticulosis. No active inflammation. Moderate diverticula in the distal descending colon. Mildly redundant transverse colon. Partially fluid-filled cecum. The appendix arises from the cecum on series 5, image 155 and is abnormally enlarged, airless and demonstrates mild surrounding inflammation. Appendix: Location: Caudal to the cecum series 5, image 155 and coronal image 71 Diameter: 11-12 mm Appendicolith: Negative Mucosal hyper-enhancement: Not well evaluated on this early contrast timed exam Extraluminal gas: Negative Periappendiceal collection: Negative, early peri appendiceal inflammatory stranding. Fluid-filled terminal ileum and other distal small bowel loops appear within normal limits. Negative stomach and duodenum. No free air. No free fluid. Lymphatic: No lymphadenopathy. Reproductive: Negative. Other: No pelvic free fluid. Musculoskeletal: No acute osseous abnormality identified. IMPRESSION: 1. Both the Gallbladder and the Appendix appear inflamed. Right Upper Quadrant Ultrasound would be valuable for further evaluation of the gallbladder. But mildly dilated and inflamed appearance of  the appendix is compatible with Early Appendicitis. Recommend Surgery consultation. 2. Suspicious localized right lateral bladder wall thickening and enhancement. Recommend Urology follow-up to evaluate for possible Bladder Carcinoma. No obstructive uropathy. 3. Aortic Atherosclerosis (ICD10-I70.0) with mild Infrarenal Abdominal Aortic Aneurysm. Recommend follow-up ultrasound every 3 years. This recommendation follows ACR consensus guidelines: White Paper of the ACR Incidental Findings Committee II on Vascular Findings. J Am Coll Radiol 2013; 10:789-794. 4. Celiac, SMA and IMA are patent, with no SMA or celiac origin stenosis. 5. Calcified coronary artery atherosclerosis. Emphysema (ICD10-J43.9). Electronically Signed   By: Genevie Ann M.D.   On: 02/26/2021 06:14   US Abdomen Limited RUQ (LIVER/GB)  Result Date: 02/26/2021 CLINICAL DATA:  80 year old male with abdominal pain for 1 day. Abnormal gallbladder on earlier CT. EXAM: ULTRASOUND ABDOMEN LIMITED RIGHT UPPER QUADRANT COMPARISON:  CT Abdomen and Pelvis 0342 hours. FINDINGS: Gallbladder: Irregular gallbladder wall thickening up to 4 mm. But no echogenic sludge or stones identified within the gallbladder lumen. No pericholecystic fluid. No sonographic Murphy sign elicited. Common bile duct: Diameter: 2 mm, normal. Liver: Suboptimal visualization of the left hepatic lobe due to bowel gas. Liver echogenicity within normal limits. No discrete liver lesion. No intrahepatic biliary ductal dilatation. Portal vein is patent on color Doppler imaging with normal direction of blood flow towards the liver. Other: Negative visible right kidney. IMPRESSION: 1. Abnormal gallbladder wall thickening, but no gallstones identified,  and no sonographic Murphy sign to suggest acute cholecystitis. 2. No evidence of bile duct obstruction. Electronically Signed   By: Genevie Ann M.D.   On: 02/26/2021 07:59        Scheduled Meds: . allopurinol  100 mg Oral BID  . amLODipine  5 mg  Oral Daily  . colchicine  0.6 mg Oral QODAY  . darifenacin  7.5 mg Oral Daily  . docusate sodium  100 mg Oral BID  . enoxaparin (LOVENOX) injection  40 mg Subcutaneous Q24H  . hydrochlorothiazide  25 mg Oral Daily  . metoprolol succinate  25 mg Oral Daily  . pantoprazole  40 mg Oral Daily  . ramipril  10 mg Oral Daily  . rosuvastatin  20 mg Oral QHS  . zinc sulfate  220 mg Oral Daily   Continuous Infusions: . 0.9 % NaCl with KCl 20 mEq / L 100 mL/hr (02/27/21 0117)  . cefTRIAXone (ROCEPHIN)  IV Stopped (02/26/21 3419)   And  . metronidazole 500 mg (02/27/21 0120)    Assessment & Plan:   Active Problems:   Acute appendicitis  1.Acute appendicitis: General surgery following Continue IV metronidazole and Rocephin Plan for possible lab appendectomy in a.m. N.p.o. at midnight tonight  2. Leukocytosis: Likely due to #1  Trending down  Continue to monitor     3. Coronary artery disease: Patient with prior history of PCI to RCA in 2004 and 09/2019. Cardiology following No recent anginal complaints Aspirin and Brilinta on hold Continue beta-blockers and statins Anticipate restarting aspirin and Brilinta when cleared by surgery   4. Hypokalemia: Acute. Replaced  Monitor levels     5. Essential hypertension: Blood pressures currently stable. -Continue metoprolol, amlodipine, HCTZ, and ramipril  6. Gout: Patient follows with Dr. Milly Jakob rheumatology in the outpatient setting. -Continue allopurinol and colchicine  7. GERD -Continue pharmacy substitution of Protonix  8. Infrarenal abdominal aortic aneurysm: Seen on CT noted to be 33 mm diameter. -Recommend follow-up ultrasound every 3 years  9. Bladder wall thickening: Localized right lateral bladder wall thickening has not spent seen on CT. Urinalysis did not show any significant signs of infection. -Consider recommending outpatient urology follow-up to evaluate for the possibility of bladder carcinoma            LOS: 1 day   Time spent: 35 minutes    Nolberto Hanlon, MD [facility] Pager [phone]  If 7PM-7AM, please contact night-coverage 02/27/2021, 9:36 AM

## 2021-02-27 NOTE — Progress Notes (Signed)
TR[facility] night shift.  The nursing staff reported that the patient's potassium level this morning is 3.3 mmol/L.  He is currently NPO.  He is currently on 0.9% sodium chloride with KCl 20 mEq at 100 mL/h.  We will continue this infusion.  Magnesium and phosphorus levels added to the morning labs.  KCl 10 mEq IVPB x2 ordered.  Tennis Must, MD.

## 2021-02-27 NOTE — Plan of Care (Signed)

## 2021-02-27 NOTE — Progress Notes (Signed)
Subjective/Chief Complaint: Patient feels much better - still with some RLQ tenderness Hungry WBC improved   Objective: Vital signs in last 24 hours: Temp:  [97.8 F (36.6 C)-98.5 F (36.9 C)] 98 F (36.7 C) (05/01 0407) Pulse Rate:  [59-67] 64 (05/01 0407) Resp:  [15-18] 16 (05/01 0407) BP: (105-131)/(68-74) 113/70 (05/01 0407) SpO2:  [92 %-99 %] 96 % (05/01 0407) Last BM Date: 02/25/21  Intake/Output from previous day: 04/30 0701 - 05/01 0700 In: 1045 [P.O.:290; I.V.:355.7; IV Piggyback:399.4] Out: 2250 [Urine:2250] Intake/Output this shift: No intake/output data recorded.  General appearance: alert, cooperative and no distress GI: soft, tender in RLQ, but less tender than yesterday.  No RUQ tenderness  Lab Results:  Recent Labs    02/26/21 0047 02/27/21 0130  WBC 14.4* 10.8*  HGB 15.1 13.6  HCT 44.5 41.2  PLT 223 196   BMET Recent Labs    02/26/21 0047 02/27/21 0130  NA 138 140  K 3.3* 3.3*  CL 104 105  CO2 24 25  GLUCOSE 116* 89  BUN 23 15  CREATININE 1.10 1.20  CALCIUM 9.1 8.5*   PT/INR Recent Labs    02/26/21 0654  LABPROT 13.3  INR 1.0   ABG No results for input(s): PHART, HCO3 in the last 72 hours.  Invalid input(s): PCO2, PO2  Studies/Results: CT Angio Abd/Pel W and/or Wo Contrast  Result Date: 02/26/2021 CLINICAL DATA:  80 year old male with lower abdominal pain. Diarrhea. EXAM: CTA ABDOMEN AND PELVIS WITHOUT AND WITH CONTRAST TECHNIQUE: Multidetector CT imaging of the abdomen and pelvis was performed using the standard protocol during bolus administration of intravenous contrast. Multiplanar reconstructed images and MIPs were obtained and reviewed to evaluate the vascular anatomy. CONTRAST:  178mL OMNIPAQUE IOHEXOL 350 MG/ML SOLN COMPARISON:  [facility] CT Abdomen and Pelvis 10/21/2012. FINDINGS: VASCULAR Aortoiliac calcified atherosclerosis. Major arterial structures including the SMA and IMA remain patent. No  celiac or SMA origin stenosis. Mild infrarenal abdominal aortic aneurysm (33 mm diameter and 1.5 x the proximal aorta caliber. Mural plaque or thrombus on series 5, image 107. This is proximal to the IMA. Negative for aortic dissection. Iliac and proximal femoral arteries remain patent. Review of the MIP images confirms the above findings. NON-VASCULAR Lower chest: Centrilobular and paraseptal emphysema appears progressed since 2013 along with subpleural lung scarring. No pleural effusion or lung base consolidation. Calcified coronary artery atherosclerosis on series 6, image 7. Cardiac size at the upper limits of normal. No pericardial effusion. Hepatobiliary: Abnormal gallbladder wall with wall thickening or small volume pericholecystic fluid (series 8, image 43). No cholelithiasis evident by CT. No bile duct enlargement. Liver enhancement remains within normal limits. Pancreas: Partially atrophied, otherwise negative. Spleen: Negative. Adrenals/Urinary Tract: Normal adrenal glands. Nonobstructed kidneys with symmetric renal enhancement. No nephrolithiasis. Decompressed ureters. There is abnormal indistinct bladder wall thickening and hyperenhancement up to 15 mm along the right lateral wall of the bladder in an area of about 5 cm. See series 5, image 203. Otherwise unremarkable urinary bladder. Stomach/Bowel: Extensive sigmoid diverticulosis. No active inflammation. Moderate diverticula in the distal descending colon. Mildly redundant transverse colon. Partially fluid-filled cecum. The appendix arises from the cecum on series 5, image 155 and is abnormally enlarged, airless and demonstrates mild surrounding inflammation. Appendix: Location: Caudal to the cecum series 5, image 155 and coronal image 71 Diameter: 11-12 mm Appendicolith: Negative Mucosal hyper-enhancement: Not well evaluated on this early contrast timed exam Extraluminal gas: Negative Periappendiceal collection: Negative, early peri appendiceal  inflammatory  stranding. Fluid-filled terminal ileum and other distal small bowel loops appear within normal limits. Negative stomach and duodenum. No free air. No free fluid. Lymphatic: No lymphadenopathy. Reproductive: Negative. Other: No pelvic free fluid. Musculoskeletal: No acute osseous abnormality identified. IMPRESSION: 1. Both the Gallbladder and the Appendix appear inflamed. Right Upper Quadrant Ultrasound would be valuable for further evaluation of the gallbladder. But mildly dilated and inflamed appearance of the appendix is compatible with Early Appendicitis. Recommend Surgery consultation. 2. Suspicious localized right lateral bladder wall thickening and enhancement. Recommend Urology follow-up to evaluate for possible Bladder Carcinoma. No obstructive uropathy. 3. Aortic Atherosclerosis (ICD10-I70.0) with mild Infrarenal Abdominal Aortic Aneurysm. Recommend follow-up ultrasound every 3 years. This recommendation follows ACR consensus guidelines: White Paper of the ACR Incidental Findings Committee II on Vascular Findings. J Am Coll Radiol 2013; 10:789-794. 4. Celiac, SMA and IMA are patent, with no SMA or celiac origin stenosis. 5. Calcified coronary artery atherosclerosis. Emphysema (ICD10-J43.9). Electronically Signed   By: Genevie Ann M.D.   On: 02/26/2021 06:14   US Abdomen Limited RUQ (LIVER/GB)  Result Date: 02/26/2021 CLINICAL DATA:  80 year old male with abdominal pain for 1 day. Abnormal gallbladder on earlier CT. EXAM: ULTRASOUND ABDOMEN LIMITED RIGHT UPPER QUADRANT COMPARISON:  CT Abdomen and Pelvis 0342 hours. FINDINGS: Gallbladder: Irregular gallbladder wall thickening up to 4 mm. But no echogenic sludge or stones identified within the gallbladder lumen. No pericholecystic fluid. No sonographic Murphy sign elicited. Common bile duct: Diameter: 2 mm, normal. Liver: Suboptimal visualization of the left hepatic lobe due to bowel gas. Liver echogenicity within normal limits. No discrete liver  lesion. No intrahepatic biliary ductal dilatation. Portal vein is patent on color Doppler imaging with normal direction of blood flow towards the liver. Other: Negative visible right kidney. IMPRESSION: 1. Abnormal gallbladder wall thickening, but no gallstones identified, and no sonographic Murphy sign to suggest acute cholecystitis. 2. No evidence of bile duct obstruction. Electronically Signed   By: Genevie Ann M.D.   On: 02/26/2021 07:59    Anti-infectives: Anti-infectives (From admission, onward)   Start     Dose/Rate Route Frequency Ordered Stop   02/26/21 0900  cefTRIAXone (ROCEPHIN) 2 g in sodium chloride 0.9 % 100 mL IVPB       "And" Linked Group Details   2 g 200 mL/hr over 30 Minutes Intravenous Every 24 hours 02/26/21 0845     02/26/21 0900  metroNIDAZOLE (FLAGYL) IVPB 500 mg       "And" Linked Group Details   500 mg 100 mL/hr over 60 Minutes Intravenous Every 8 hours 02/26/21 0845     02/26/21 0645  piperacillin-tazobactam (ZOSYN) IVPB 3.375 g        3.375 g 100 mL/hr over 30 Minutes Intravenous  Once 02/26/21 0637 02/26/21 0730      Assessment/Plan: Acute appendicitis - clinically improved on abx.  Since he will be on chronic anticoagulation after discharge, he would like to proceed with lap appy during this admission to avoid the possibility of having recurrence of his appendicitis.   CAD with stents - Appreciate cardiology consult.   Other medical issues - appreciate TRH assistance  Clear liquids today NPO p MN for probable lap appendectomy tomorrow.  LOS: 1 day    Maia Petties 02/27/2021

## 2021-02-28 ENCOUNTER — Encounter (HOSPITAL_COMMUNITY): Payer: Self-pay

## 2021-02-28 ENCOUNTER — Inpatient Hospital Stay (HOSPITAL_COMMUNITY): Payer: BC Managed Care – PPO | Admitting: Anesthesiology

## 2021-02-28 ENCOUNTER — Encounter (HOSPITAL_COMMUNITY): Admission: EM | Disposition: A | Discharge status: Home / Self Care | Payer: Self-pay | Source: Home / Self Care

## 2021-02-28 DIAGNOSIS — K3580 Unspecified acute appendicitis: Principal | ICD-10-CM

## 2021-02-28 HISTORY — PX: LAPAROSCOPIC APPENDECTOMY: SHX408

## 2021-02-28 LAB — CBC
HCT: 41.6 % (ref 39.0–52.0)
Hemoglobin: 13.4 g/dL (ref 13.0–17.0)
MCH: 32 pg (ref 26.0–34.0)
MCHC: 32.2 g/dL (ref 30.0–36.0)
MCV: 99.3 fL (ref 80.0–100.0)
Platelets: 191 K/uL (ref 150–400)
RBC: 4.19 MIL/uL — ABNORMAL LOW (ref 4.22–5.81)
RDW: 15 % (ref 11.5–15.5)
WBC: 8 K/uL (ref 4.0–10.5)
nRBC: 0 % (ref 0.0–0.2)

## 2021-02-28 LAB — BASIC METABOLIC PANEL
Anion gap: 7 (ref 5–15)
BUN: 14 mg/dL (ref 8–23)
CO2: 28 mmol/L (ref 22–32)
Calcium: 8.6 mg/dL — ABNORMAL LOW (ref 8.9–10.3)
Chloride: 106 mmol/L (ref 98–111)
Creatinine, Ser: 1.24 mg/dL (ref 0.61–1.24)
GFR, Estimated: 60 mL/min — ABNORMAL LOW (ref >60)
Glucose, Bld: 91 mg/dL (ref 70–99)
Potassium: 3.9 mmol/L (ref 3.5–5.1)
Sodium: 141 mmol/L (ref 135–145)

## 2021-02-28 SURGERY — APPENDECTOMY, LAPAROSCOPIC
Anesthesia: General | Site: Abdomen

## 2021-02-28 MED ORDER — FENTANYL CITRATE (PF) 100 MCG/2ML IJ SOLN
25.0000 ug | INTRAMUSCULAR | Status: DC | PRN
  Administered 2021-02-28 (×2): 50 ug via INTRAVENOUS

## 2021-02-28 MED ORDER — CHLORHEXIDINE GLUCONATE CLOTH 2 % EX PADS
6.0000 | MEDICATED_PAD | Freq: Every day | CUTANEOUS | Status: DC
  Administered 2021-02-28 – 2021-03-01 (×2): 6 via TOPICAL

## 2021-02-28 MED ORDER — PHENYLEPHRINE HCL-NACL 10-0.9 MG/250ML-% IV SOLN
INTRAVENOUS | Status: DC | PRN
  Administered 2021-02-28: 40 ug/min via INTRAVENOUS

## 2021-02-28 MED ORDER — FENTANYL CITRATE (PF) 250 MCG/5ML IJ SOLN
INTRAMUSCULAR | Status: DC | PRN
  Administered 2021-02-28 (×3): 50 ug via INTRAVENOUS
  Administered 2021-02-28: 100 ug via INTRAVENOUS

## 2021-02-28 MED ORDER — CHLORHEXIDINE GLUCONATE 0.12 % MT SOLN
OROMUCOSAL | Status: AC
  Administered 2021-02-28: 15 mL via OROMUCOSAL
  Filled 2021-02-28: qty 15

## 2021-02-28 MED ORDER — ROCURONIUM BROMIDE 10 MG/ML (PF) SYRINGE
PREFILLED_SYRINGE | INTRAVENOUS | Status: DC | PRN
  Administered 2021-02-28: 50 mg via INTRAVENOUS

## 2021-02-28 MED ORDER — OXYCODONE HCL 5 MG/5ML PO SOLN: 5.0000 mg | Freq: Once | ORAL | Status: DC | PRN

## 2021-02-28 MED ORDER — DEXAMETHASONE SODIUM PHOSPHATE 10 MG/ML IJ SOLN
INTRAMUSCULAR | Status: AC
  Filled 2021-02-28: qty 1

## 2021-02-28 MED ORDER — PROPOFOL 10 MG/ML IV BOLUS
INTRAVENOUS | Status: DC | PRN
  Administered 2021-02-28: 130 mg via INTRAVENOUS

## 2021-02-28 MED ORDER — FENTANYL CITRATE (PF) 100 MCG/2ML IJ SOLN
INTRAMUSCULAR | Status: AC
  Filled 2021-02-28: qty 2

## 2021-02-28 MED ORDER — LIDOCAINE 2% (20 MG/ML) 5 ML SYRINGE
INTRAMUSCULAR | Status: DC | PRN
  Administered 2021-02-28: 100 mg via INTRAVENOUS

## 2021-02-28 MED ORDER — PROPOFOL 10 MG/ML IV BOLUS
INTRAVENOUS | Status: AC
  Filled 2021-02-28: qty 20

## 2021-02-28 MED ORDER — ONDANSETRON HCL 4 MG/2ML IJ SOLN
INTRAMUSCULAR | Status: AC
  Filled 2021-02-28: qty 2

## 2021-02-28 MED ORDER — ROCURONIUM BROMIDE 10 MG/ML (PF) SYRINGE
PREFILLED_SYRINGE | INTRAVENOUS | Status: AC
  Filled 2021-02-28: qty 10

## 2021-02-28 MED ORDER — LIDOCAINE 2% (20 MG/ML) 5 ML SYRINGE
INTRAMUSCULAR | Status: AC
  Filled 2021-02-28: qty 5

## 2021-02-28 MED ORDER — CHLORHEXIDINE GLUCONATE 0.12 % MT SOLN: 15.0000 mL | Freq: Once | OROMUCOSAL | Status: AC

## 2021-02-28 MED ORDER — ACETAMINOPHEN 500 MG PO TABS: 1000.0000 mg | ORAL_TABLET | Freq: Once | ORAL | Status: DC

## 2021-02-28 MED ORDER — BUPIVACAINE-EPINEPHRINE 0.25% -1:200000 IJ SOLN
INTRAMUSCULAR | Status: DC | PRN
  Administered 2021-02-28: 20 mL

## 2021-02-28 MED ORDER — 0.9 % SODIUM CHLORIDE (POUR BTL) OPTIME
TOPICAL | Status: DC | PRN
  Administered 2021-02-28: 1000 mL

## 2021-02-28 MED ORDER — DEXAMETHASONE SODIUM PHOSPHATE 10 MG/ML IJ SOLN
INTRAMUSCULAR | Status: DC | PRN
  Administered 2021-02-28: 10 mg via INTRAVENOUS

## 2021-02-28 MED ORDER — OXYCODONE HCL 5 MG PO TABS
5.0000 mg | ORAL_TABLET | Freq: Once | ORAL | Status: DC | PRN
Start: 2021-02-28 — End: 2021-02-28

## 2021-02-28 MED ORDER — ORAL CARE MOUTH RINSE: 15.0000 mL | Freq: Once | OROMUCOSAL | Status: AC

## 2021-02-28 MED ORDER — FENTANYL CITRATE (PF) 250 MCG/5ML IJ SOLN
INTRAMUSCULAR | Status: AC
  Filled 2021-02-28: qty 5

## 2021-02-28 MED ORDER — LACTATED RINGERS IV SOLN: INTRAVENOUS | Status: DC

## 2021-02-28 MED ORDER — PROMETHAZINE HCL 25 MG/ML IJ SOLN: 6.2500 mg | INTRAMUSCULAR | Status: DC | PRN

## 2021-02-28 MED ORDER — SUGAMMADEX SODIUM 200 MG/2ML IV SOLN
INTRAVENOUS | Status: DC | PRN
  Administered 2021-02-28: 200 mg via INTRAVENOUS

## 2021-02-28 MED ORDER — MUPIROCIN 2 % EX OINT
1.0000 "application " | TOPICAL_OINTMENT | Freq: Two times a day (BID) | CUTANEOUS | Status: DC
  Administered 2021-02-28 – 2021-03-01 (×4): 1 via NASAL
  Filled 2021-02-28 (×3): qty 22

## 2021-02-28 MED ORDER — BUPIVACAINE-EPINEPHRINE (PF) 0.25% -1:200000 IJ SOLN
INTRAMUSCULAR | Status: AC
  Filled 2021-02-28: qty 30

## 2021-02-28 SURGICAL SUPPLY — 55 items
ADH SKN CLS APL DERMABOND .7 (GAUZE/BANDAGES/DRESSINGS) ×1
ADH SKN CLS LQ APL DERMABOND (GAUZE/BANDAGES/DRESSINGS) ×1
APL PRP STRL LF DISP 70% ISPRP (MISCELLANEOUS) ×1
APPLIER CLIP ROT 10 11.4 M/L (STAPLE)
APR CLP MED LRG 11.4X10 (STAPLE)
BAG SPEC RTRVL LRG 6X4 10 (ENDOMECHANICALS) ×1
BLADE CLIPPER SURG (BLADE) IMPLANT
CANISTER SUCT 3000ML PPV (MISCELLANEOUS) IMPLANT
CHLORAPREP W/TINT 26 (MISCELLANEOUS) ×2 IMPLANT
CLIP APPLIE ROT 10 11.4 M/L (STAPLE) IMPLANT
COVER SURGICAL LIGHT HANDLE (MISCELLANEOUS) ×2 IMPLANT
COVER WAND RF STERILE (DRAPES) ×1 IMPLANT
CUTTER FLEX LINEAR 45M (STAPLE) ×2 IMPLANT
DERMABOND ADHESIVE PROPEN (GAUZE/BANDAGES/DRESSINGS) ×1
DERMABOND ADVANCED (GAUZE/BANDAGES/DRESSINGS) ×1
DERMABOND ADVANCED .7 DNX12 (GAUZE/BANDAGES/DRESSINGS) IMPLANT
DERMABOND ADVANCED .7 DNX6 (GAUZE/BANDAGES/DRESSINGS) ×1 IMPLANT
ELECT CAUTERY BLADE 6.4 (BLADE) ×2 IMPLANT
ELECT REM PT RETURN 9FT ADLT (ELECTROSURGICAL) ×2
ELECTRODE REM PT RTRN 9FT ADLT (ELECTROSURGICAL) ×1 IMPLANT
ENDOLOOP SUT PDS II  0 18 (SUTURE)
ENDOLOOP SUT PDS II 0 18 (SUTURE) IMPLANT
GLOVE BIO SURGEON STRL SZ 6.5 (GLOVE) ×2 IMPLANT
GLOVE BIOGEL PI IND STRL 6 (GLOVE) ×1 IMPLANT
GLOVE BIOGEL PI INDICATOR 6 (GLOVE) ×1
GOWN STRL REUS W/ TWL LRG LVL3 (GOWN DISPOSABLE) ×3 IMPLANT
GOWN STRL REUS W/TWL LRG LVL3 (GOWN DISPOSABLE) ×6
GRASPER SUT TROCAR 14GX15 (MISCELLANEOUS) ×1 IMPLANT
KIT BASIN OR (CUSTOM PROCEDURE TRAY) ×2 IMPLANT
KIT TURNOVER KIT B (KITS) ×2 IMPLANT
NDL INSUFFLATION 14GA 120MM (NEEDLE) IMPLANT
NEEDLE INSUFFLATION 14GA 120MM (NEEDLE) ×2 IMPLANT
NS IRRIG 1000ML POUR BTL (IV SOLUTION) ×2 IMPLANT
PAD ARMBOARD 7.5X6 YLW CONV (MISCELLANEOUS) ×4 IMPLANT
PENCIL BUTTON HOLSTER BLD 10FT (ELECTRODE) ×2 IMPLANT
POUCH SPECIMEN RETRIEVAL 10MM (ENDOMECHANICALS) ×2 IMPLANT
RELOAD 45 VASCULAR/THIN (ENDOMECHANICALS) IMPLANT
RELOAD STAPLE 45 2.5 WHT GRN (ENDOMECHANICALS) IMPLANT
RELOAD STAPLE 45 3.5 BLU ETS (ENDOMECHANICALS) IMPLANT
RELOAD STAPLE TA45 3.5 REG BLU (ENDOMECHANICALS) ×4 IMPLANT
SCISSORS LAP 5X35 DISP (ENDOMECHANICALS) IMPLANT
SET IRRIG TUBING LAPAROSCOPIC (IRRIGATION / IRRIGATOR) IMPLANT
SET TUBE SMOKE EVAC HIGH FLOW (TUBING) ×2 IMPLANT
SHEARS HARMONIC ACE PLUS 36CM (ENDOMECHANICALS) ×1 IMPLANT
SLEEVE ENDOPATH XCEL 5M (ENDOMECHANICALS) ×2 IMPLANT
SPECIMEN JAR SMALL (MISCELLANEOUS) ×2 IMPLANT
SUT MNCRL AB 4-0 PS2 18 (SUTURE) ×2 IMPLANT
SUT VICRYL 0 UR6 27IN ABS (SUTURE) ×1 IMPLANT
TOWEL GREEN STERILE (TOWEL DISPOSABLE) ×2 IMPLANT
TOWEL GREEN STERILE FF (TOWEL DISPOSABLE) ×2 IMPLANT
TRAY LAPAROSCOPIC MC (CUSTOM PROCEDURE TRAY) ×2 IMPLANT
TROCAR XCEL 12X100 BLDLESS (ENDOMECHANICALS) ×1 IMPLANT
TROCAR XCEL BLUNT TIP 100MML (ENDOMECHANICALS) IMPLANT
TROCAR XCEL NON-BLD 5MMX100MML (ENDOMECHANICALS) ×2 IMPLANT
WATER STERILE IRR 1000ML POUR (IV SOLUTION) ×2 IMPLANT

## 2021-02-28 NOTE — Anesthesia Preprocedure Evaluation (Addendum)
Anesthesia Evaluation  Patient identified by MRN, date of birth, ID band Patient awake    Reviewed: Allergy & Precautions, NPO status , Patient's Chart, lab work & pertinent test results  History of Anesthesia Complications Negative for: history of anesthetic complications  Airway Mallampati: II  TM Distance: >3 FB Neck ROM: Full    Dental no notable dental hx.    Pulmonary former smoker,    Pulmonary exam normal        Cardiovascular hypertension, + CAD, + Past MI and + Cardiac Stents (2004, 2020 (to RCA))  Normal cardiovascular exam  TTE 2020: EF 55-60%, mild LVH, grade 1 DD, valves ok   Neuro/Psych negative neurological ROS  negative psych ROS   GI/Hepatic Neg liver ROS, hiatal hernia, GERD  ,  Endo/Other  negative endocrine ROS  Renal/GU negative Renal ROS  negative genitourinary   Musculoskeletal  (+) Arthritis , Rheumatoid disorders,    Abdominal   Peds  Hematology negative hematology ROS (+)   Anesthesia Other Findings Day of surgery medications reviewed with patient.  Reproductive/Obstetrics negative OB ROS                            Anesthesia Physical Anesthesia Plan  ASA: III  Anesthesia Plan: General   Post-op Pain Management:    Induction: Intravenous  PONV Risk Score and Plan: 3 and Treatment may vary due to age or medical condition, Ondansetron and Dexamethasone  Airway Management Planned: Oral ETT  Additional Equipment: None  Intra-op Plan:   Post-operative Plan: Extubation in OR  Informed Consent: I have reviewed the patients History and Physical, chart, labs and discussed the procedure including the risks, benefits and alternatives for the proposed anesthesia with the patient or authorized representative who has indicated his/her understanding and acceptance.     Dental advisory given  Plan Discussed with: CRNA  Anesthesia Plan Comments:         Anesthesia Quick Evaluation

## 2021-02-28 NOTE — Transfer of Care (Signed)
Immediate Anesthesia Transfer of Care Note  Patient: Robert Reilly  Procedure(s) Performed: APPENDECTOMY LAPAROSCOPIC (N/A Abdomen)  Patient Location: PACU  Anesthesia Type:General  Level of Consciousness: awake, alert  and oriented  Airway & Oxygen Therapy: Patient Spontanous Breathing and Patient connected to nasal cannula oxygen  Post-op Assessment: Report given to RN, Post -op Vital signs reviewed and stable and Patient moving all extremities X 4  Post vital signs: Reviewed and stable  Last Vitals:  Vitals Value Taken Time  BP 129/68 02/28/21 1630  Temp 36.4 C 02/28/21 1630  Pulse 68 02/28/21 1634  Resp 15 02/28/21 1634  SpO2 97 % 02/28/21 1634  Vitals shown include unvalidated device data.  Last Pain:  Vitals:   02/28/21 1415  TempSrc:   PainSc: 0-No pain      Patients Stated Pain Goal: 3 (93/79/02 4097)  Complications: No complications documented.

## 2021-02-28 NOTE — Progress Notes (Signed)
PROGRESS NOTE    Robert Reilly  JJH:[id number] DOB: 1941/01/11 DOA: 02/26/2021 PCP: Alroy Dust, L.Marlou Sa, MD    Brief Narrative:  Robert Reilly is a 80 y.o. male with medical history significant of HTN, HLD, CAD s/p PCI, and gout who presents with complaints of lower abdominal pain that began yesterday evening.  Prior to onset of symptoms he had loose stool earlier that day.  After the onset abdominal pain was very nauseous, but did not vomit.  Denies any fever, shortness of breath, chest pain, or dysuria.  Labs were significant for WBC 14.4, potassium 3.3, and all other labs relatively within normal limits.  CT angiogram of the chest, abdomen, and pelvis which revealed concern for possible early appendicitis, right-sided bladder wall thickening, infrarenal AAA.  Right upper quadrant abdominal ultrasound significant for gallbladder wall thickening with no gallstones or sonographic Murphy sign to suggest acute cholecystitis.  TRH consulted for medical management      Subjective: Feels pain is improving. Has pain if he presses on abdomen. No nausea vomiting  Objective: Vitals:   02/27/21 1111 02/27/21 1339 02/27/21 2034 02/28/21 0619  BP: 133/78 104/78 126/77 123/76  Pulse:  81 66 (!) 58  Resp:  16 17 19   Temp:  98 F (36.7 C) 98 F (36.7 C) 97.8 F (36.6 C)  TempSrc:  Oral    SpO2:  96% 94% 96%  Weight:      Height:        Intake/Output Summary (Last 24 hours) at 02/28/2021 0849 Last data filed at 02/28/2021 0803 Gross per 24 hour  Intake 580 ml  Output 3625 ml  Net -3045 ml   Filed Weights   02/26/21 0038  Weight: 82.6 kg    Examination: Calm, comfortable CTA no wheezing Regular S1-S2 Soft TTP of RLQ+bs No edema Grossly intact Mood and affect appropriate in current setting    Data Reviewed: I have personally reviewed following labs and imaging studies  CBC: Recent Labs  Lab 02/26/21 0047 02/27/21 0130 02/28/21 0037  WBC 14.4* 10.8* 8.0  HGB 15.1 13.6 13.4  HCT  44.5 41.2 41.6  MCV 96.3 98.3 99.3  PLT 223 196 818   Basic Metabolic Panel: Recent Labs  Lab 02/26/21 0047 02/27/21 0130 02/28/21 0037  NA 138 140 141  K 3.3* 3.3* 3.9  CL 104 105 106  CO2 24 25 28   GLUCOSE 116* 89 91  BUN 23 15 14   CREATININE 1.10 1.20 1.24  CALCIUM 9.1 8.5* 8.6*  MG  --  1.9  --   PHOS  --  2.8  --    GFR: Estimated Creatinine Clearance: 51.5 mL/min (by C-G formula based on SCr of 1.24 mg/dL). Liver Function Tests: Recent Labs  Lab 02/26/21 0047  AST 20  ALT 19  ALKPHOS 56  BILITOT 0.7  PROT 7.1  ALBUMIN 3.7   Recent Labs  Lab 02/26/21 0047  LIPASE 38   No results for input(s): AMMONIA in the last 168 hours. Coagulation Profile: Recent Labs  Lab 02/26/21 0654  INR 1.0   Cardiac Enzymes: No results for input(s): CKTOTAL, CKMB, CKMBINDEX, TROPONINI in the last 168 hours. BNP (last 3 results) No results for input(s): PROBNP in the last 8760 hours. HbA1C: No results for input(s): HGBA1C in the last 72 hours. CBG: No results for input(s): GLUCAP in the last 168 hours. Lipid Profile: No results for input(s): CHOL, HDL, LDLCALC, TRIG, CHOLHDL, LDLDIRECT in the last 72 hours. Thyroid Function Tests: No results  for input(s): TSH, T4TOTAL, FREET4, T3FREE, THYROIDAB in the last 72 hours. Anemia Panel: No results for input(s): VITAMINB12, FOLATE, FERRITIN, TIBC, IRON, RETICCTPCT in the last 72 hours. Sepsis Labs: Recent Labs  Lab 02/26/21 0315  LATICACIDVEN 1.6    Recent Results (from the past 240 hour(s))  Resp Panel by RT-PCR (Flu A&B, Covid) Nasopharyngeal Swab     Status: None   Collection Time: 02/26/21  6:48 AM   Specimen: Nasopharyngeal Swab; Nasopharyngeal(NP) swabs in vial transport medium  Result Value Ref Range Status   SARS Coronavirus 2 by RT PCR NEGATIVE NEGATIVE Final    Comment: (NOTE) SARS-CoV-2 target nucleic acids are NOT DETECTED.  The SARS-CoV-2 RNA is generally detectable in upper respiratory specimens during  the acute phase of infection. The lowest concentration of SARS-CoV-2 viral copies this assay can detect is 138 copies/mL. A negative result does not preclude SARS-Cov-2 infection and should not be used as the sole basis for treatment or other patient management decisions. A negative result may occur with  improper specimen collection/handling, submission of specimen other than nasopharyngeal swab, presence of viral mutation(s) within the areas targeted by this assay, and inadequate number of viral copies(<138 copies/mL). A negative result must be combined with clinical observations, patient history, and epidemiological information. The expected result is Negative.  Fact Sheet for Patients:  EntrepreneurPulse.com.au  Fact Sheet for Healthcare Providers:  IncredibleEmployment.be  This test is no t yet approved or cleared by the [place] FDA and  has been authorized for detection and/or diagnosis of SARS-CoV-2 by FDA under an Emergency Use Authorization (EUA). This EUA will remain  in effect (meaning this test can be used) for the duration of the COVID-19 declaration under Section 564(b)(1) of the Act, 21 U.S.C.section 360bbb-3(b)(1), unless the authorization is terminated  or revoked sooner.       Influenza A by PCR NEGATIVE NEGATIVE Final   Influenza B by PCR NEGATIVE NEGATIVE Final    Comment: (NOTE) The Xpert Xpress SARS-CoV-2/FLU/RSV plus assay is intended as an aid in the diagnosis of influenza from Nasopharyngeal swab specimens and should not be used as a sole basis for treatment. Nasal washings and aspirates are unacceptable for Xpert Xpress SARS-CoV-2/FLU/RSV testing.  Fact Sheet for Patients: EntrepreneurPulse.com.au  Fact Sheet for Healthcare Providers: IncredibleEmployment.be  This test is not yet approved or cleared by the [place] FDA and has been authorized for detection and/or  diagnosis of SARS-CoV-2 by FDA under an Emergency Use Authorization (EUA). This EUA will remain in effect (meaning this test can be used) for the duration of the COVID-19 declaration under Section 564(b)(1) of the Act, 21 U.S.C. section 360bbb-3(b)(1), unless the authorization is terminated or revoked.  Performed at [facility], [place] [address] [place], [place] [address]   Surgical pcr screen     Status: Abnormal   Collection Time: 02/27/21  9:05 PM   Specimen: Nasal Mucosa; Nasal Swab  Result Value Ref Range Status   MRSA, PCR NEGATIVE NEGATIVE Final   Staphylococcus aureus POSITIVE (A) NEGATIVE Final    Comment: (NOTE) The Xpert SA Assay (FDA approved for NASAL specimens in patients 39 years of age and older), is one component of a comprehensive surveillance program. It is not intended to diagnose infection nor to guide or monitor treatment. Performed at [facility], [place] [address] [place], [place] [address]          Radiology Studies: No results found.      Scheduled Meds: .  allopurinol  100 mg Oral BID  . amLODipine  5 mg Oral Daily  . Chlorhexidine Gluconate Cloth  6 each Topical Daily  . colchicine  0.6 mg Oral QODAY  . darifenacin  7.5 mg Oral Daily  . docusate sodium  100 mg Oral BID  . enoxaparin (LOVENOX) injection  40 mg Subcutaneous Q24H  . hydrochlorothiazide  25 mg Oral Daily  . metoprolol succinate  25 mg Oral Daily  . mupirocin ointment  1 application Nasal BID  . pantoprazole  40 mg Oral Daily  . ramipril  10 mg Oral Daily  . rosuvastatin  20 mg Oral QHS  . zinc sulfate  220 mg Oral Daily   Continuous Infusions: . 0.9 % NaCl with KCl 20 mEq / L 100 mL/hr (02/27/21 0117)  . cefTRIAXone (ROCEPHIN)  IV 2 g (02/28/21 0826)   And  . metronidazole 500 mg (02/28/21 0826)    Assessment & Plan:   Active Problems:   Acute appendicitis  1.Acute appendicitis: General surgery following Continue IV metronidazole and Rocephin 5/2  plan for lap appendectomy today    2. Leukocytosis:  Likely due to #1 Resolved Continue to monitor    3. Coronary artery disease: Patient with prior history of PCI to RCA in 2004 and 09/2019. Cardiology following No recent anginal complaints 5/2 aspirin and Brilinta on hold, resume when cleared by surgery  Continue beta-blockers and statins     4. Hypokalemia: Replaced and resolved K3.9 today     5. Essential hypertension: Blood pressures currently stable. -Continue metoprolol, amlodipine, HCTZ, and ramipril  6. Gout: Patient follows with Dr. Milly Jakob rheumatology in the outpatient setting. -Continue allopurinol and colchicine  7. GERD -Continue pharmacy substitution of Protonix  8. Infrarenal abdominal aortic aneurysm: Seen on CT noted to be 33 mm diameter. -Recommend follow-up ultrasound every 3 years  9. Bladder wall thickening: Localized right lateral bladder wall thickening has not spent seen on CT. Urinalysis did not show any significant signs of infection. -Consider recommending outpatient urology follow-up to evaluate for the possibility of bladder carcinoma           LOS: 2 days   Time spent: 35 minutes    Nolberto Hanlon, MD [facility] Pager [phone]  If 7PM-7AM, please contact night-coverage 02/28/2021, 8:49 AM

## 2021-02-28 NOTE — Op Note (Signed)
   Operative Note   Date: 02/28/2021  Procedure: laparoscopic appendectomy  Pre-op diagnosis: acute appendicitis Post-op diagnosis: Grade 1b appendicitis  Indication and clinical history: The patient is a 80 y.o. year old male with acute appendicitis     Surgeon: Jesusita Oka, MD  Anesthesiologist: Daiva Huge, MD Anesthesia: General  Findings:  . Specimen: appendix . EBL: <5cc . Drains/Implants: none  Disposition: PACU - hemodynamically stable.  Description of procedure: The patient was positioned supine on the operating room table. Time-out was performed verifying correct patient, procedure, signature of informed consent, and administration of pre-operative antibiotics. General anesthetic induction and intubation were uneventful. Foley catheter insertion was performed and was atraumatic . The abdomen was prepped and draped in the usual sterile fashion. A Veress needle followed by insufflation and a 48mm port placed using an optiview technique in the left lower quadrant. The abdomen was inspected and no injury identified. A supra-umbilical incision was made and a 64mm port placed under direct visualization. An additional 57mm port was placed under direct visualization in the suprapubic region just left of midline. The patient was repositioned to Trendelenburg with the left side down. Further inspection of the right lower quadrant revealed no abscess or purulent fluid and grade 1b appendicitis. Adhesiolysis was performed for approximately 15  minutes during dissection, mobilization, and identification of the appendix. The appendix was dissected away from its mesoappendix and a Harmonic scalpel used to divide the mesoappendix. A bowel load of the endoscopic stapler was used to staple across the appendix at its base. The staple line was inspected and found to be intact and without bleeding. The appendix was placed in an endoscopic specimen retrieval bag, removed via the umbilical port site, and sent  to pathology as a specimen. The right lower quadrant was again inspected and hemostasis confirmed. Any remaining fluid identified was suctioned. The fascia of the supra-umbillical incision was closed with zero vicryl suture using a suture passer. The suprapubic and left lower quadrant ports were removed under direct visualization and hemostasis confirmed. The abdomen was desufflated and and additional local anesthetic was administered at the umbilical incision site. The skin of all port sites was closed with 4-0 monocryl. Sterile dressings were applied. All sponge and instrument counts were correct at the conclusion of the procedure. The patient was awakened from anesthesia, extubated uneventfully, and transported to the PACU in good condition. There were no complications.    Jesusita Oka, MD General [facility]

## 2021-02-28 NOTE — Anesthesia Postprocedure Evaluation (Signed)
Anesthesia Post Note  Patient: Robert Reilly  Procedure(s) Performed: APPENDECTOMY LAPAROSCOPIC (N/A Abdomen)     Patient location during evaluation: PACU Anesthesia Type: General Level of consciousness: awake and alert, patient cooperative and oriented Pain management: pain level controlled Vital Signs Assessment: post-procedure vital signs reviewed and stable Respiratory status: spontaneous breathing, nonlabored ventilation and respiratory function stable Cardiovascular status: blood pressure returned to baseline and stable Postop Assessment: no apparent nausea or vomiting Anesthetic complications: no   No complications documented.  Last Vitals:  Vitals:   02/28/21 1700 02/28/21 1715  BP: 125/68 118/67  Pulse: 69 68  Resp: 11 12  Temp:  36.4 C  SpO2: 92% 93%    Last Pain:  Vitals:   02/28/21 1726  TempSrc:   PainSc: 6                  Cloee Dunwoody,E. Tyriq Moragne

## 2021-02-28 NOTE — Progress Notes (Signed)
General Surgery Follow Up Note  Subjective:    Overnight Issues:   Objective:  Vital signs for last 24 hours: Temp:  [97.8 F (36.6 C)-98 F (36.7 C)] 97.8 F (36.6 C) (05/02 0619) Pulse Rate:  [58-66] 58 (05/02 0619) Resp:  [17-19] 19 (05/02 0619) BP: (123-126)/(76-77) 123/76 (05/02 0619) SpO2:  [94 %-96 %] 96 % (05/02 0619)  Hemodynamic parameters for last 24 hours:    Intake/Output from previous day: 05/01 0701 - 05/02 0700 In: 42 [P.O.:580] Out: 3325 [Urine:3325]  Intake/Output this shift: Total I/O In: 0  Out: 540 [Urine:540]  Vent settings for last 24 hours:    Physical Exam:  Gen: comfortable, no distress Neuro: non-focal exam HEENT: PERRL Neck: supple CV: RRR Pulm: unlabored breathing Abd: soft, NT, umbilical hernia repair well healed GU: clear yellow urine Extr: wwp, no edema   Results for orders placed or performed during the hospital encounter of 02/26/21 (from the past 24 hour(s))  Surgical pcr screen     Status: Abnormal   Collection Time: 02/27/21  9:05 PM   Specimen: Nasal Mucosa; Nasal Swab  Result Value Ref Range   MRSA, PCR NEGATIVE NEGATIVE   Staphylococcus aureus POSITIVE (A) NEGATIVE  CBC     Status: Abnormal   Collection Time: 02/28/21 12:37 AM  Result Value Ref Range   WBC 8.0 4.0 - 10.5 K/uL   RBC 4.19 (L) 4.22 - 5.81 MIL/uL   Hemoglobin 13.4 13.0 - 17.0 g/dL   HCT 41.6 39.0 - 52.0 %   MCV 99.3 80.0 - 100.0 fL   MCH 32.0 26.0 - 34.0 pg   MCHC 32.2 30.0 - 36.0 g/dL   RDW 15.0 11.5 - 15.5 %   Platelets 191 150 - 400 K/uL   nRBC 0.0 0.0 - 0.2 %  Basic metabolic panel     Status: Abnormal   Collection Time: 02/28/21 12:37 AM  Result Value Ref Range   Sodium 141 135 - 145 mmol/L   Potassium 3.9 3.5 - 5.1 mmol/L   Chloride 106 98 - 111 mmol/L   CO2 28 22 - 32 mmol/L   Glucose, Bld 91 70 - 99 mg/dL   BUN 14 8 - 23 mg/dL   Creatinine, Ser 1.24 0.61 - 1.24 mg/dL   Calcium 8.6 (L) 8.9 - 10.3 mg/dL   GFR, Estimated 60 (L)  >60 mL/min   Anion gap 7 5 - 15  Prepare Pheresed Platelets     Status: None (Preliminary result)   Collection Time: 02/28/21  2:07 PM  Result Value Ref Range   Unit Number [id number]    Blood Component Type PLTPHER LR2    Unit division 00    Status of Unit ALLOCATED    Transfusion Status OK TO TRANSFUSE     Assessment & Plan: The plan of care was discussed with the bedside nurse for the day, who is in agreement with this plan and no additional concerns were raised.   Present on Admission: . Acute appendicitis    LOS: 2 days   Additional comments:I reviewed the patient's new clinical lab test results.   and I reviewed the patients new imaging test results.    Acute appendicitis - no-op mgmt initially, but desires surgery this admission. Plan for appy today. Last dose of Brilinta 4/30. Plan for txf 1u plt intra-op. Follows with Dr. Marlou Porch as o/p FEN - reg diet post-op Dispo - medsurg    Robert Oka, MD Trauma & General Surgery Please  use [email or web address] to contact on call provider  02/28/2021  *Care during the described time interval was provided by me. I have reviewed this patient's available data, including medical history, events of note, physical examination and test results as part of my evaluation.

## 2021-02-28 NOTE — Anesthesia Procedure Notes (Signed)
Procedure Name: Intubation Date/Time: 02/28/2021 3:13 PM Performed by: Mariea Clonts, CRNA Pre-anesthesia Checklist: Patient identified, Emergency Drugs available, Suction available and Patient being monitored Patient Re-evaluated:Patient Re-evaluated prior to induction Oxygen Delivery Method: Circle System Utilized Preoxygenation: Pre-oxygenation with 100% oxygen Induction Type: IV induction Ventilation: Mask ventilation without difficulty and Oral airway inserted - appropriate to patient size Laryngoscope Size: Sabra Heck and 2 Grade View: Grade I Tube type: Oral Tube size: 7.5 mm Number of attempts: 1 Airway Equipment and Method: Stylet and Oral airway Placement Confirmation: ETT inserted through vocal cords under direct vision,  positive ETCO2 and breath sounds checked- equal and bilateral Tube secured with: Tape Dental Injury: Teeth and Oropharynx as per pre-operative assessment

## 2021-03-01 ENCOUNTER — Encounter (HOSPITAL_COMMUNITY): Payer: Self-pay | Admitting: Surgery

## 2021-03-01 LAB — CBC
HCT: 41.7 % (ref 39.0–52.0)
Hemoglobin: 13.7 g/dL (ref 13.0–17.0)
MCH: 31.9 pg (ref 26.0–34.0)
MCHC: 32.9 g/dL (ref 30.0–36.0)
MCV: 97.2 fL (ref 80.0–100.0)
Platelets: 257 10*3/uL (ref 150–400)
RBC: 4.29 MIL/uL (ref 4.22–5.81)
RDW: 14.6 % (ref 11.5–15.5)
WBC: 9.8 10*3/uL (ref 4.0–10.5)
nRBC: 0 % (ref 0.0–0.2)

## 2021-03-01 LAB — PREPARE PLATELET PHERESIS: Unit division: 0

## 2021-03-01 LAB — BPAM PLATELET PHERESIS
Blood Product Expiration Date: 202205032359
ISSUE DATE / TIME: 202205021447
Unit Type and Rh: 5100

## 2021-03-01 MED ORDER — DOCUSATE SODIUM 100 MG PO CAPS: 100.0000 mg | ORAL_CAPSULE | Freq: Two times a day (BID) | ORAL | 0 refills | Status: AC

## 2021-03-01 MED ORDER — IBUPROFEN 800 MG PO TABS: 800.0000 mg | ORAL_TABLET | Freq: Three times a day (TID) | ORAL | 0 refills | Status: AC | PRN

## 2021-03-01 MED ORDER — OXYCODONE HCL 5 MG PO TABS: 5.0000 mg | ORAL_TABLET | Freq: Four times a day (QID) | ORAL | 0 refills | Status: AC | PRN

## 2021-03-01 NOTE — Progress Notes (Addendum)
Progress Note  Patient Name: Robert Reilly Date of Encounter: 03/01/2021  Primary Cardiologist: Candee Furbish, MD  Subjective   Feeling well, no complaints. Denies CP. Surgery went well. He reflects fondly on the care provided to him by [name] prior to her retirement.  Inpatient Medications    Scheduled Meds: . allopurinol  100 mg Oral BID  . amLODipine  5 mg Oral Daily  . Chlorhexidine Gluconate Cloth  6 each Topical Daily  . colchicine  0.6 mg Oral QODAY  . darifenacin  7.5 mg Oral Daily  . docusate sodium  100 mg Oral BID  . enoxaparin (LOVENOX) injection  40 mg Subcutaneous Q24H  . hydrochlorothiazide  25 mg Oral Daily  . metoprolol succinate  25 mg Oral Daily  . mupirocin ointment  1 application Nasal BID  . pantoprazole  40 mg Oral Daily  . ramipril  10 mg Oral Daily  . rosuvastatin  20 mg Oral QHS  . zinc sulfate  220 mg Oral Daily   Continuous Infusions: . 0.9 % NaCl with KCl 20 mEq / L 100 mL/hr at 03/01/21 0541  . cefTRIAXone (ROCEPHIN)  IV 2 g (02/28/21 0826)   And  . metronidazole 500 mg (03/01/21 0120)   PRN Meds: diphenhydrAMINE **OR** diphenhydrAMINE, morphine injection, nitroGLYCERIN, ondansetron **OR** ondansetron (ZOFRAN) IV   Vital Signs    Vitals:   02/28/21 1753 02/28/21 1958 03/01/21 0231 03/01/21 0543  BP: 125/64 119/71 111/70 119/79  Pulse: 65 71 78 74  Resp: 15 16 14 16   Temp: (!) 97.5 F (36.4 C) 97.6 F (36.4 C) 97.9 F (36.6 C) 97.8 F (36.6 C)  TempSrc: Oral Oral Oral Oral  SpO2: 95% 95% 95% 93%  Weight:      Height:        Intake/Output Summary (Last 24 hours) at 03/01/2021 0829 Last data filed at 03/01/2021 0300 Gross per 24 hour  Intake 795 ml  Output 890 ml  Net -95 ml   Last 3 Weights 02/26/2021 06/30/2020 03/03/2020  Weight (lbs) 182 lb 178 lb 9.6 oz 178 lb  Weight (kg) 82.555 kg 81.012 kg 80.74 kg     Telemetry    NSR - Personally Reviewed  Physical Exam   GEN: No acute distress.  HEENT: Normocephalic,  atraumatic, sclera non-icteric. Neck: No JVD or bruits. Cardiac: RRR no murmurs, rubs, or gallops.  Respiratory: Clear to auscultation bilaterally. Breathing is unlabored. GI: Soft, nontender, non-distended, BS +x 4. MS: no deformity. Extremities: No clubbing or cyanosis. No edema. Distal pedal pulses are 2+ and equal bilaterally. Neuro:  AAOx3. Follows commands. Psych:  Responds to questions appropriately with a normal affect.  Labs    High Sensitivity Troponin:  No results for input(s): TROPONINIHS in the last 720 hours.    Cardiac EnzymesNo results for input(s): TROPONINI in the last 168 hours. No results for input(s): TROPIPOC in the last 168 hours.   Chemistry Recent Labs  Lab 02/26/21 0047 02/27/21 0130 02/28/21 0037  NA 138 140 141  K 3.3* 3.3* 3.9  CL 104 105 106  CO2 24 25 28   GLUCOSE 116* 89 91  BUN 23 15 14   CREATININE 1.10 1.20 1.24  CALCIUM 9.1 8.5* 8.6*  PROT 7.1  --   --   ALBUMIN 3.7  --   --   AST 20  --   --   ALT 19  --   --   ALKPHOS 56  --   --  BILITOT 0.7  --   --   GFRNONAA >60 >60 60*  ANIONGAP 10 10 7      Hematology Recent Labs  Lab 02/26/21 0047 02/27/21 0130 02/28/21 0037  WBC 14.4* 10.8* 8.0  RBC 4.62 4.19* 4.19*  HGB 15.1 13.6 13.4  HCT 44.5 41.2 41.6  MCV 96.3 98.3 99.3  MCH 32.7 32.5 32.0  MCHC 33.9 33.0 32.2  RDW 14.8 15.1 15.0  PLT 223 196 191    BNPNo results for input(s): BNP, PROBNP in the last 168 hours.   DDimer No results for input(s): DDIMER in the last 168 hours.   Radiology    No results found.  Cardiac Studies   Cath: 47/82/95   LV end diastolic pressure is mildly elevated.  The left ventricular systolic function is normal.  There is no aortic valve stenosis.  CULPRIT LESION: Mid RCA lesion is 90% stenosed.  A drug-eluting stent was successfully placed using a STENT RESOLUTE ONYX 3.0X12. - post-dilated to 3.35 mm  Post intervention, there is a 0% residual stenosis.  Previously placed Mid  RCA to Dist RCA stent (DES) is widely patent.  Separate Ostia for LAD & LCx  SUMMARY  Severe Single Vessel CAD - mRCA 90% focal lesion upstream from m-dRCA stent  Successful DES PCI of mRCA (Resolute Onyx DES 3.0 x 12 -> post-dilated to 3.35 mm)  No significant disease in LAD & LCx systems - each wtih separate ostia.  Likely preserved LVEF (inadequate LV Gram to assess wall motion) with mildly elevated LVEDP   RECOMMENDATIONS  Overnight admission post PCI  TR Band removal per protocol  Continue to titrate IV NTG overnight for BP control  ASA/Brilinta DAPT x min 6 months uninterrupted (ok to interrupt after 6 months)  Ensure statin & Beta Blocker (pending HR monitoring overnight)   [name], MD, MS    TTE: 10/28/19 IMPRESSIONS  1. Left ventricular ejection fraction, by visual estimation, is 55 to 60%. The left ventricle has normal function. Left ventricular septal wall thickness was mildly increased. Mildly increased left ventricular posterior wall thickness. There is mildly  increased left ventricular hypertrophy. 2. Left ventricular diastolic parameters are consistent with Grade I diastolic dysfunction (impaired relaxation). 3. The left ventricle has no regional wall motion abnormalities. 4. Global right ventricle has normal systolic function.The right ventricular size is normal. No increase in right ventricular wall thickness. 5. Left atrial size was normal. 6. Right atrial size was normal. 7. The mitral valve is normal in structure. Trivial mitral valve regurgitation. No evidence of mitral stenosis. 8. The tricuspid valve is normal in structure. 9. The aortic valve is tricuspid. Aortic valve regurgitation is not visualized. No evidence of aortic valve sclerosis or stenosis. 10. The pulmonic valve was normal in structure. Pulmonic valve regurgitation is not visualized. 11. TR signal is inadequate for assessing pulmonary artery systolic  pressure. 12. The inferior vena cava is normal in size with <50% respiratory variability, suggesting right atrial pressure of 8 mmHg  Patient Profile     80 y.o. male with history of CAD s/p PCI to RCA in 2004 with subsequent PCI/DES to RCA in 2020, HTN, HLD, and RA who presented with appendicitis. Cardiology saw for pre-op clearance.  Assessment & Plan    1. Appendicitis - was cleared for surgery from our standpoint, went to OR 5/2 and did well  2. CAD s/p prior PCI as above - aspirin and Brilinta held due to need for surgery  - per Dr. Victorino December  consult note 4/30, "OK to permanently DC Brilinta at this time" so will clarify with Dr. Margaretann Loveless if she is in agreement - I.e. not to resume at discharge - resume ASA when OK with surgery - continue BB, statin  3. HTN  - BP stable, continue home regimen of amlodipine, HCTZ, Toprol and ramipril  4. HLD - LDL 56 06/2020 - Continue crestor  Has f/u appt with Dr. Marlou Porch 03/08/21 which we will keep - has not seen Dr. Marlou Porch in quite some time, only Cecille Rubin; this will help them get re-acquainted.  For questions or updates, please contact [place] Please consult [email or web address] for contact info under Cardiology/STEMI.  Signed, Charlie Pitter, PA-C 03/01/2021, 8:29 AM

## 2021-03-01 NOTE — Progress Notes (Signed)
Progress Note  1 Day Post-Op  Subjective: CC: feeling well though with expected abdominal soreness. Ambulating and passing flatus. No respiratory complaints   Objective: Vital signs in last 24 hours: Temp:  [97.5 F (36.4 C)-97.9 F (36.6 C)] 97.8 F (36.6 C) (05/03 0543) Pulse Rate:  [65-78] 74 (05/03 0543) Resp:  [11-20] 16 (05/03 0543) BP: (111-129)/(64-79) 119/79 (05/03 0543) SpO2:  [92 %-100 %] 93 % (05/03 0543) Last BM Date: 02/27/21  Intake/Output from previous day: 05/02 0701 - 05/03 0700 In: 795 [I.V.:500; Blood:295] Out: 1190 [Urine:1165; Blood:25] Intake/Output this shift: No intake/output data recorded.  PE: General: pleasant, WD, male who is sitting up in bed in NAD HEENT: head is normocephalic, atraumatic.  Sclera are noninjected.  Ears and nose without any masses or lesions.  Mouth is pink and moist Heart: regular, rate, and rhythm.  Normal s1,s2. No obvious murmurs, gallops, or rubs noted.  Palpable radial and pedal pulses bilaterally Lungs: CTAB, no wheezes, rhonchi, or rales noted.  Respiratory effort nonlabored Abd: soft, NT, mild expected tenderness to palpation, +BS, incision sites with glue intact - no erythema or discharge MS: all 4 extremities are symmetrical with no cyanosis, clubbing, or edema. Skin: warm and dry with no masses, lesions, or rashes Neuro: Cranial nerves 2-12 grossly intact, sensation is normal throughout Psych: A&Ox3 with an appropriate affect.    Lab Results:  Recent Labs    02/27/21 0130 02/28/21 0037  WBC 10.8* 8.0  HGB 13.6 13.4  HCT 41.2 41.6  PLT 196 191   BMET Recent Labs    02/27/21 0130 02/28/21 0037  NA 140 141  K 3.3* 3.9  CL 105 106  CO2 25 28  GLUCOSE 89 91  BUN 15 14  CREATININE 1.20 1.24  CALCIUM 8.5* 8.6*   PT/INR No results for input(s): LABPROT, INR in the last 72 hours. CMP     Component Value Date/Time   NA 141 02/28/2021 0037   NA 140 06/30/2020 0921   K 3.9 02/28/2021 0037   CL  106 02/28/2021 0037   CO2 28 02/28/2021 0037   GLUCOSE 91 02/28/2021 0037   BUN 14 02/28/2021 0037   BUN 20 06/30/2020 0921   CREATININE 1.24 02/28/2021 0037   CREATININE 0.99 09/18/2016 1556   CALCIUM 8.6 (L) 02/28/2021 0037   PROT 7.1 02/26/2021 0047   PROT 7.0 06/30/2020 0921   ALBUMIN 3.7 02/26/2021 0047   ALBUMIN 4.0 06/30/2020 0921   AST 20 02/26/2021 0047   ALT 19 02/26/2021 0047   ALKPHOS 56 02/26/2021 0047   BILITOT 0.7 02/26/2021 0047   BILITOT 0.8 06/30/2020 0921   GFRNONAA 60 (L) 02/28/2021 0037   GFRAA 67 06/30/2020 0921   Lipase     Component Value Date/Time   LIPASE 38 02/26/2021 0047       Studies/Results: No results found.  Anti-infectives: Anti-infectives (From admission, onward)   Start     Dose/Rate Route Frequency Ordered Stop   02/26/21 0900  cefTRIAXone (ROCEPHIN) 2 g in sodium chloride 0.9 % 100 mL IVPB       "And" Linked Group Details   2 g 200 mL/hr over 30 Minutes Intravenous Every 24 hours 02/26/21 0845     02/26/21 0900  metroNIDAZOLE (FLAGYL) IVPB 500 mg       "And" Linked Group Details   500 mg 100 mL/hr over 60 Minutes Intravenous Every 8 hours 02/26/21 0845     02/26/21 0645  piperacillin-tazobactam (ZOSYN) IVPB 3.375 g  3.375 g 100 mL/hr over 30 Minutes Intravenous  Once 02/26/21 1601 02/26/21 0730       Assessment/Plan  Acute appendicitis - no-op mgmt initially, but desires surgery this admission. Last dose of Brilinta 4/30. Plan for txf 1u plt intra-op. Follows with Dr. Marlou Porch as o/p - lap appendectomy 5/2 with AL - follow pending CBC today - if Hgb stable resume brilinta  FEN - reg diet post-op Dispo - home today    LOS: 3 days    Winferd Humphrey, [facility] 03/01/2021, 8:08 AM Please see Amion for pager number during day hours 7:00am-4:30pm

## 2021-03-01 NOTE — Progress Notes (Signed)
Discharge instructions (including medications) discussed with and copy provided to patient/caregiver 

## 2021-03-01 NOTE — Discharge Summary (Signed)
[facility] Discharge Summary   Patient ID: Robert Reilly MRN: [id number] DOB/AGE: 01-02-1941 80 y.o.  Admit date: 02/26/2021 Discharge date: 03/01/2021  Admitting Diagnosis: Appendicitis  Discharge Diagnosis Patient Active Problem List   Diagnosis Date Noted  . Acute appendicitis 02/26/2021  . Hyperlipidemia 10/29/2019  . Unstable angina ([place]) 10/28/2019  . Old MI (myocardial infarction) 09/10/2013  . Coronary artery disease involving native coronary artery of native heart with unstable angina pectoris ([place]) 09/10/2013  . RA (rheumatoid arthritis) ([place]) 09/10/2013  . HTN (hypertension) 09/10/2013    Consultants Cardiology [facility]  Imaging: No results found.  Procedures Dr. Bobbye Morton (02/28/21) - Laparoscopic Appendectomy  Hospital Course:  80 year old male who [facility] ED with abdominal pain.  Workup showed acute appendicitis without perforation.  Patient has coronary artery disease on brilinta. Non operative management was initially attempted. Cardiology and hospitalist teams were consulted for assistance with medical management. During admission after discussion patient desired operative management. Tolerated procedure well and was transferred to the floor.  Diet was advanced as tolerated.  On POD1, the patient was voiding well, tolerating diet, ambulating well, pain well controlled, vital signs stable, incisions c/d/i and felt stable for discharge home.  Patient will follow up in our office in 2-3 weeks and knows to call with questions or concerns.  He will call to confirm appointment date/time.    I or a member of my team have reviewed this patient in the Controlled Substance Database.   Allergies as of 03/01/2021      Reactions   Tetracyclines & Related Rash   Other Other (See Comments)   No meat: Came from a tick bite       Medication List    TAKE these medications   acetaminophen 500 MG tablet Commonly known as: TYLENOL Take 500  mg by mouth every 6 (six) hours as needed for moderate pain or headache.   AIRBORNE GUMMIES PO Take 2 tablets by mouth daily.   allopurinol 100 MG tablet Commonly known as: ZYLOPRIM Take 100 mg by mouth 2 (two) times daily.   amLODipine 5 MG tablet Commonly known as: NORVASC TAKE 1 TABLET BY MOUTH DAILY.   aspirin EC 81 MG tablet Take 81 mg by mouth daily.   colchicine 0.6 MG tablet Take 0.6 mg by mouth every other day.   docusate sodium 100 MG capsule Commonly known as: COLACE Take 1 capsule (100 mg total) by mouth 2 (two) times daily for 7 days.   EPINEPHrine 0.3 mg/0.3 mL Soaj injection Commonly known as: EPI-PEN Inject 0.3 mg into the skin as needed.   Flax Seed Oil 1000 MG Caps Take 1,000 mg by mouth 2 (two) times daily.   hydrochlorothiazide 25 MG tablet Commonly known as: HYDRODIURIL TAKE 1 TABLET BY MOUTH DAILY.   ibuprofen 800 MG tablet Commonly known as: ADVIL Take 1 tablet (800 mg total) by mouth every 8 (eight) hours as needed for up to 5 days for mild pain or moderate pain.   loratadine 10 MG tablet Commonly known as: CLARITIN Take 10 mg by mouth daily as needed for itching.   metoprolol succinate 25 MG 24 hr tablet Commonly known as: TOPROL-XL Take 1 tablet (25 mg total) by mouth daily. Take with or immediately following a meal.   nitroGLYCERIN 0.4 MG SL tablet Commonly known as: NITROSTAT Place 1 tablet (0.4 mg total) under the tongue every 5 (five) minutes as needed for chest pain.   omeprazole 20  MG capsule Commonly known as: PRILOSEC TAKE 1 CAPSULE BY MOUTH 2 TIMES DAILY BEFORE A MEAL. What changed: See the new instructions.   oxyCODONE 5 MG immediate release tablet Commonly known as: Roxicodone Take 1 tablet (5 mg total) by mouth every 6 (six) hours as needed for up to 5 days for moderate pain or severe pain.   ramipril 10 MG capsule Commonly known as: ALTACE Take 1 capsule (10 mg total) by mouth daily.   rosuvastatin 20 MG  tablet Commonly known as: CRESTOR TAKE 1 TABLET BY MOUTH AT BEDTIME.   solifenacin 5 MG tablet Commonly known as: VESICARE Take 5 mg by mouth daily.   ticagrelor 90 MG Tabs tablet Commonly known as: BRILINTA Take 1 tablet (90 mg total) by mouth 2 (two) times daily.   vitamin C 100 MG tablet Take 100 mg by mouth daily.   Vitamin D (Cholecalciferol) 25 MCG (1000 UT) Caps Take 1,000 Units by mouth daily.   zinc gluconate 50 MG tablet Take 50 mg by mouth daily.         Follow-up [facility] Follow up.   Specialty: General Surgery Why: Call to confirm your appointment date and time. Arrive 30 minutes prior to your appt time and bring insurance card and photo ID Contact information: [place] [phone]       Jerline Pain, MD Follow up.   Specialty: Cardiology Why: [facility] (Cardiology) - Keep follow up as scheduled Tuesday Mar 08, 2021 2:40 PM (Arrive by 2:25 PM). Contact information: [address] [place] [address] [phone]               Signed: Caroll Rancher [facility] 03/01/2021, 11:14 AM Please see Amion for pager number during day hours 7:00am-4:30pm

## 2021-03-01 NOTE — Discharge Instructions (Signed)
May shower beginning after discharge. Do not peel off or scrub skin glue. May allow warm soapy water to run over incision, then rinse and pat dry. Do not soak in any water (tubs, hot tubs, pools, lakes, oceans) for one week.   No lifting greater than 5 pounds for six weeks.   Pain regimen: take over-the-counter tylenol (acetaminophen) 1000mg  every six hours and the prescription ibuprofen (800mg ) every eight hours. You also have a prescription for oxycodone, which should be taken if the tylenol (acetaminophen) and ibuprofen are not enough to control your pain. You may take the oxycodone as frequently as every six hours as needed. You have also been given a prescription for colace (docusate) which is a stool softener. Please take this as prescribed because the oxycodone can cause constipation and the colace (docusate) will minimize or prevent constipation.  Call the office at [phone] for temperature greater than 101.22F, worsening pain, redness or warmth at the incision site.  Please call [phone] to make an appointment for 1 week after surgery for wound check.

## 2021-03-01 NOTE — Plan of Care (Signed)
  Problem: Education: Goal: Knowledge of General Education information will improve Description: Including pain rating scale, medication(s)/side effects and non-pharmacologic comfort measures Outcome: Adequate for Discharge   

## 2021-03-02 ENCOUNTER — Telehealth: Payer: Self-pay | Admitting: Cardiology

## 2021-03-02 LAB — SURGICAL PATHOLOGY

## 2021-03-02 NOTE — Telephone Encounter (Signed)
Called and spoke with pt who is asking when he should restart his Brilinta.  Advised pt generally the surgeon decides when it is safe and appropriate to restart.  He reports his surgeon told him to contact us.  Below is the cardiology consult note stating pt to restart when cleared to do so from a surgical standpoint.  1. Preoperative assessment: patient presented with RLQ abdominal pain and imaging c/f early appendicitis. General surgery admitted with plans for conservative management. Cardiology asked to evaluate for preop risk in the event conservative management fails. Patient has been doing well from a cardiac standpoint without recent chest pain or DOE. He can complete 4 METs without anginal complaints. He is >1 yr out from last PCI/DES to RCA 09/2019.  - Based on ACC/AHA guidelines, Robert Reilly would be at acceptable risk for the planned procedure without further cardiovascular testing.  - Okay to hold aspirin and brilinta x5 days with plans to restart when cleared to do so from a surgical standpoint.  - Continue BBlocker in the perioperative setting  According to pt's d/c medication list, he was advised to restart Brilinta 03/01/2021.  Instructed pt Dr Marlou Porch would want him to go ahead and restart.    Pt's DES was placed 10/28/2019.  The decision of if and when pt can d/c Brilinta will be discussed at pt's f/u visit scheduled 03/08/2021.   Will forward this information to Dr Marlou Porch for his knowledge.

## 2021-03-02 NOTE — Telephone Encounter (Signed)
    Pt c/o medication issue:  1. Name of Medication:   ticagrelor (BRILINTA) 90 MG TABS tablet    2. How are you currently taking this medication (dosage and times per day)? Take 1 tablet (90 mg total) by mouth 2 (two) times daily.  3. Are you having a reaction (difficulty breathing--STAT)?   4. What is your medication issue? Pt said, he just had a surgery and have question about his brilinta, he said he needs to speak with a nurse as soon as possible so he can take his morning meds today

## 2021-03-07 ENCOUNTER — Other Ambulatory Visit: Payer: Self-pay

## 2021-03-07 MED ORDER — METOPROLOL SUCCINATE ER 25 MG PO TB24: 25.0000 mg | ORAL_TABLET | Freq: Every day | ORAL | 1 refills | Status: DC

## 2021-03-08 ENCOUNTER — Ambulatory Visit: Payer: BC Managed Care – PPO | Admitting: Cardiology

## 2021-03-08 ENCOUNTER — Encounter: Payer: Self-pay | Admitting: Cardiology

## 2021-03-08 ENCOUNTER — Other Ambulatory Visit: Payer: Self-pay

## 2021-03-08 VITALS — BP 90/50 | HR 76 | Ht 68.0 in | Wt 182.0 lb

## 2021-03-08 DIAGNOSIS — I25110 Atherosclerotic heart disease of native coronary artery with unstable angina pectoris: Secondary | ICD-10-CM

## 2021-03-08 DIAGNOSIS — Z955 Presence of coronary angioplasty implant and graft: Secondary | ICD-10-CM | POA: Diagnosis not present

## 2021-03-08 DIAGNOSIS — I10 Essential (primary) hypertension: Secondary | ICD-10-CM

## 2021-03-08 NOTE — Patient Instructions (Signed)
Medication Instructions:  Please discontinue your Brilinta and Amlodipine.  Continue all other medications as listed.  *If you need a refill on your cardiac medications before your next appointment, please call your pharmacy*  Follow-Up: At [facility], you and your health needs are our priority.  As part of our continuing mission to provide you with exceptional heart care, we have created designated Provider Care Teams.  These Care Teams include your primary Cardiologist (physician) and Advanced Practice Providers (APPs -  Physician Assistants and Nurse Practitioners) who all work together to provide you with the care you need, when you need it.  We recommend signing up for the patient portal called "MyChart".  Sign up information is provided on this After Visit Summary.  MyChart is used to connect with patients for Virtual Visits (Telemedicine).  Patients are able to view lab/test results, encounter notes, upcoming appointments, etc.  Non-urgent messages can be sent to your provider as well.   To learn more about what you can do with MyChart, go to [email or web address].    Your next appointment:   6 month(s)  The format for your next appointment:   In Person  Provider:   Candee Furbish, MD   Thank you for choosing [facility]!!

## 2021-03-08 NOTE — Progress Notes (Signed)
Cardiology Office Note:    Date:  03/08/2021   ID:  Robert Reilly, DOB June 11, 1941, MRN [id number]  PCP:  Alroy Dust, L.Marlou Sa, MD   [place] Prov[facility] Cardiologist:  Candee Furbish, MD     Referring MD: Alroy Dust, Carlean Jews.Marlou Sa, MD     History of Present Illness:    Robert Reilly is a 80 y.o. male with coronary artery disease status post PCI to RCA in 2020 by Dr. Ellyn Hack here with recent appendectomy.  Doing well.  No chest pain.  Sometimes when he pushes himself he may feel an emptiness in his chest he states.  No pressure.  Past Medical History:  Diagnosis Date  . Acute myocardial infarction of other inferior wall, subsequent episode of care   . Cancer ([place])    melenoma  back , left arm   4-5 yrs ago  . Coronary atherosclerosis of native coronary artery   . GERD (gastroesophageal reflux disease)   . Gout   . History of hiatal hernia   . Hypercholesteremia   . Hypertension   . Old MI (myocardial infarction) 09/10/2013   2004, stent to RCA  . Osteoarthritis   . Other disorder of calcium metabolism   . Tick bite 05/25/2016   states has had 5 in past month    Past Surgical History:  Procedure Laterality Date  . CARDIAC CATHETERIZATION    . CORONARY ANGIOPLASTY     stent x 1    12-15 yrs ago  . CORONARY STENT INTERVENTION N/A 10/28/2019   Procedure: CORONARY STENT INTERVENTION;  Surgeon: Leonie Man, MD;  Location: [facility];  Service: Cardiovascular;  Laterality: N/A;  . EYE SURGERY Bilateral    cataracts  . INSERTION OF MESH N/A 05/30/2016   Procedure: INSERTION OF MESH;  Surgeon: Jackolyn Confer, MD;  Location: [facility];  Service: General;  Laterality: N/A;  . LAPAROSCOPIC APPENDECTOMY N/A 02/28/2021   Procedure: APPENDECTOMY LAPAROSCOPIC;  Surgeon: Jesusita Oka, MD;  Location: [facility];  Service: General;  Laterality: N/A;  . LEFT HEART CATH AND CORONARY ANGIOGRAPHY N/A 10/28/2019   Procedure: LEFT HEART CATH AND CORONARY ANGIOGRAPHY;  Surgeon: Leonie Man, MD;   Location: [facility];  Service: Cardiovascular;  Laterality: N/A;  . MELANOMA EXCISION     back, left arm  . SHOULDER ARTHROSCOPY WITH ROTATOR CUFF REPAIR Left   . UMBILICAL HERNIA REPAIR N/A 05/30/2016   Procedure: UMBILICAL HERNIA REPAIR;  Surgeon: Jackolyn Confer, MD;  Location: [facility];  Service: General;  Laterality: N/A;    Current Medications: Current Meds  Medication Sig  . acetaminophen (TYLENOL) 500 MG tablet Take 500 mg by mouth every 6 (six) hours as needed for moderate pain or headache.  . allopurinol (ZYLOPRIM) 100 MG tablet Take 100 mg by mouth 2 (two) times daily.   . Ascorbic Acid (VITAMIN C) 100 MG tablet Take 100 mg by mouth daily.  Marland Kitchen aspirin EC 81 MG tablet Take 81 mg by mouth daily.  . colchicine 0.6 MG tablet Take 0.6 mg by mouth every other day.   . docusate sodium (COLACE) 100 MG capsule Take 1 capsule (100 mg total) by mouth 2 (two) times daily for 7 days.  Marland Kitchen EPINEPHrine 0.3 mg/0.3 mL IJ SOAJ injection Inject 0.3 mg into the skin as needed.  . Flaxseed, Linseed, (FLAX SEED OIL) 1000 MG CAPS Take 1,000 mg by mouth 2 (two) times daily.   . hydrochlorothiazide (HYDRODIURIL) 25 MG tablet TAKE 1 TABLET BY  MOUTH DAILY.  Marland Kitchen loratadine (CLARITIN) 10 MG tablet Take 10 mg by mouth daily as needed for itching.  . metoprolol succinate (TOPROL-XL) 25 MG 24 hr tablet Take 1 tablet (25 mg total) by mouth daily. Take with or immediately following a meal.  . Multiple Vitamins-Minerals (AIRBORNE GUMMIES PO) Take 2 tablets by mouth daily.  . nitroGLYCERIN (NITROSTAT) 0.4 MG SL tablet Place 1 tablet (0.4 mg total) under the tongue every 5 (five) minutes as needed for chest pain.  Marland Kitchen omeprazole (PRILOSEC) 20 MG capsule TAKE 1 CAPSULE BY MOUTH 2 TIMES DAILY BEFORE A MEAL.  . ramipril (ALTACE) 10 MG capsule Take 1 capsule (10 mg total) by mouth daily.  . rosuvastatin (CRESTOR) 20 MG tablet TAKE 1 TABLET BY MOUTH AT BEDTIME.  Marland Kitchen solifenacin (VESICARE) 5 MG tablet Take 5 mg by mouth  daily.  . Vitamin D, Cholecalciferol, 25 MCG (1000 UT) CAPS Take 1,000 Units by mouth daily.  Marland Kitchen zinc gluconate 50 MG tablet Take 50 mg by mouth daily.  . [DISCONTINUED] amLODipine (NORVASC) 5 MG tablet TAKE 1 TABLET BY MOUTH DAILY.  . [DISCONTINUED] ticagrelor (BRILINTA) 90 MG TABS tablet Take 1 tablet (90 mg total) by mouth 2 (two) times daily.     Allergies:   Tetracyclines & related and Other   Social History   Socioeconomic History  . Marital status: Married    Spouse name: Not on file  . Number of children: Not on file  . Years of education: 68  . Highest education level: Master's degree (e.g., MA, MS, MEng, MEd, MSW, MBA)  Occupational History  . Not on file  Tobacco Use  . Smoking status: Former Smoker    Types: Cigarettes    Quit date: 05/25/2001    Years since quitting: 19.8  . Smokeless tobacco: Former Network engineer  . Vaping Use: Never used  Substance and Sexual Activity  . Alcohol use: Yes    Comment: 2-3 drinks week- gin  . Drug use: No  . Sexual activity: Not on file  Other Topics Concern  . Not on file  Social History Narrative  . Not on file   Social Determinants of Health   Financial Resource Strain: Not on file  Food Insecurity: Not on file  Transportation Needs: Not on file  Physical Activity: Not on file  Stress: Not on file  Social Connections: Not on file     Family History: The patient's family history includes Cancer in his father; Stroke in his mother.  ROS:   Please see the history of present illness.     All other systems reviewed and are negative.  EKGs/Labs/Other Studies Reviewed:    The following studies were reviewed today:   Cath: 52/77/82   LV end diastolic pressure is mildly elevated.  The left ventricular systolic function is normal.  There is no aortic valve stenosis.  CULPRIT LESION: Mid RCA lesion is 90% stenosed.  A drug-eluting stent was successfully placed using a STENT RESOLUTE ONYX 3.0X12. -  post-dilated to 3.35 mm  Post intervention, there is a 0% residual stenosis.  Previously placed Mid RCA to Dist RCA stent (DES) is widely patent.  Separate [place] for LAD & LCx  SUMMARY  Severe Single Vessel CAD - mRCA 90% focal lesion upstream from m-dRCA stent  Successful DES PCI of mRCA (Resolute Onyx DES 3.0 x 12 -> post-dilated to 3.35 mm)  No significant disease in LAD & LCx systems - each wtih separate ostia.  Likely  preserved LVEF (inadequate LV Gram to assess wall motion) with mildly elevated LVEDP   RECOMMENDATIONS  Overnight admission post PCI  TR Band removal per protocol  Continue to titrate IV NTG overnight for BP control  ASA/Brilinta DAPT x min 6 months uninterrupted (ok to interrupt after 6 months)  Ensure statin & Beta Blocker (pending HR monitoring overnight)   [name], MD, MS    TTE: 10/28/19 IMPRESSIONS  1. Left ventricular ejection fraction, by visual estimation, is 55 to 60%. The left ventricle has normal function. Left ventricular septal wall thickness was mildly increased. Mildly increased left ventricular posterior wall thickness. There is mildly  increased left ventricular hypertrophy. 2. Left ventricular diastolic parameters are consistent with Grade I diastolic dysfunction (impaired relaxation). 3. The left ventricle has no regional wall motion abnormalities. 4. Global right ventricle has normal systolic function.The right ventricular size is normal. No increase in right ventricular wall thickness. 5. Left atrial size was normal. 6. Right atrial size was normal. 7. The mitral valve is normal in structure. Trivial mitral valve regurgitation. No evidence of mitral stenosis. 8. The tricuspid valve is normal in structure. 9. The aortic valve is tricuspid. Aortic valve regurgitation is not visualized. No evidence of aortic valve sclerosis or stenosis. 10. The pulmonic valve was normal in structure. Pulmonic valve  regurgitation is not visualized. 11. TR signal is inadequate for assessing pulmonary artery systolic pressure. 12. The inferior vena cava is normal in size with <50% respiratory variability, suggesting right atrial pressure of 8 mmHg   EKG:  EKG is  ordered today.  The ekg ordered today demonstrates sinus rhythm 72 left bundle branch block  Recent Labs: 06/30/2020: TSH 3.260 02/26/2021: ALT 19 02/27/2021: Magnesium 1.9 02/28/2021: BUN 14; Creatinine, Ser 1.24; Potassium 3.9; Sodium 141 03/01/2021: Hemoglobin 13.7; Platelets 257  Recent Lipid Panel    Component Value Date/Time   CHOL 123 06/30/2020 0921   CHOL 142 03/05/2014 0944   TRIG 195 (H) 06/30/2020 0921   TRIG 199 (H) 03/05/2014 0944   HDL 35 (L) 06/30/2020 0921   HDL 38 (L) 03/05/2014 0944   CHOLHDL 3.5 06/30/2020 0921   CHOLHDL 4 03/15/2015 1018   VLDL 43.2 (H) 03/15/2015 1018   LDLCALC 56 06/30/2020 0921   LDLCALC 64 03/05/2014 0944   LDLDIRECT 76.0 03/15/2015 1018     Risk Assessment/Calculations:      Physical Exam:    VS:  BP (!) 90/50 (BP Location: Left Arm, Patient Position: Sitting, Cuff Size: Normal)   Pulse 76   Ht 5\' 8"  (1.727 m)   Wt 182 lb (82.6 kg)   SpO2 96%   BMI 27.67 kg/m     Wt Readings from Last 3 Encounters:  03/08/21 182 lb (82.6 kg)  02/26/21 182 lb (82.6 kg)  06/30/20 178 lb 9.6 oz (81 kg)     GEN:  Well nourished, well developed in no acute distress HEENT: Normal NECK: No JVD; No carotid bruits LYMPHATICS: No lymphadenopathy CARDIAC: RRR, no murmurs, rubs, gallops RESPIRATORY:  Clear to auscultation without rales, wheezing or rhonchi  ABDOMEN: Soft, non-tender, non-distended MUSCULOSKELETAL:  No edema; No deformity  SKIN: Warm and dry NEUROLOGIC:  Alert and oriented x 3 PSYCHIATRIC:  Normal affect   ASSESSMENT:    1. Coronary artery disease involving native coronary artery of native heart with unstable angina pectoris ([place])   2. S/P coronary artery stent placement   3.  Essential hypertension    PLAN:    In order of  problems listed above:  Appendicitis - Recent appendectomy went to the OR on 5/2 and did well.  CAD status post prior PCI as above - Both aspirin and Brilinta were held for surgery.  At this point it is okay to permanently DC the Brilinta.  Resume aspirin.  Essential hypertension - Continue with amlodipine hypertension medication hydrochlorothiazide Toprol and ramipril.  Blood pressure is fairly soft today. Will stop amlodipine 5. Monitor at home.   Hyperlipidemia - On high intensity Crestor, continue with LDL 56.  6 month follow up  Medication Adjustments/Labs and Tests Ordered: Current medicines are reviewed at length with the patient today.  Concerns regarding medicines are outlined above.  No orders of the defined types were placed in this encounter.  No orders of the defined types were placed in this encounter.   Patient Instructions  Medication Instructions:  Please discontinue your Brilinta and Amlodipine.  Continue all other medications as listed.  *If you need a refill on your cardiac medications before your next appointment, please call your pharmacy*  Follow-Up: At [facility], you and your health needs are our priority.  As part of our continuing mission to provide you with exceptional heart care, we have created designated Provider Care Teams.  These Care Teams include your primary Cardiologist (physician) and Advanced Practice Providers (APPs -  Physician Assistants and Nurse Practitioners) who all work together to provide you with the care you need, when you need it.  We recommend signing up for the patient portal called "MyChart".  Sign up information is provided on this After Visit Summary.  MyChart is used to connect with patients for Virtual Visits (Telemedicine).  Patients are able to view lab/test results, encounter notes, upcoming appointments, etc.  Non-urgent messages can be sent to your provider as well.    To learn more about what you can do with MyChart, go to [email or web address].    Your next appointment:   6 month(s)  The format for your next appointment:   In Person  Provider:   Candee Furbish, MD   Thank you for choosing [facility]!!        Signed, Candee Furbish, MD  03/08/2021 3:23 PM    [place]

## 2021-03-09 NOTE — Telephone Encounter (Signed)
Brilinta d/ced at [facility] visit with Dr Candee Furbish 03/08/2021.  Pt is aware.

## 2021-04-05 ENCOUNTER — Telehealth: Payer: Self-pay | Admitting: Cardiology

## 2021-04-05 ENCOUNTER — Other Ambulatory Visit: Payer: Self-pay | Admitting: *Deleted

## 2021-04-05 MED ORDER — RAMIPRIL 10 MG PO CAPS: 10.0000 mg | ORAL_CAPSULE | Freq: Every day | ORAL | 3 refills | Status: DC

## 2021-04-05 MED ORDER — AMLODIPINE BESYLATE 2.5 MG PO TABS
2.5000 mg | ORAL_TABLET | Freq: Every day | ORAL | 3 refills | Status: DC
Start: 2021-04-05 — End: 2022-04-20

## 2021-04-05 NOTE — Telephone Encounter (Signed)
Pt is aware to restart Amlodipine 2.5 mg.  Requests RX go to [facility].  He will continue to monitor his BP and call back if no improvement.

## 2021-04-05 NOTE — Telephone Encounter (Signed)
Start Amlodipine 2.5mg  PO QD Candee Furbish, MD

## 2021-04-05 NOTE — Telephone Encounter (Signed)
Pt calling today with report of BP being elevated for him 159/88 and 151/85 for the last 2 days.  Pt was seen 03/08/21 and Amlodipine 5 mg daily was d/ced d/t BP being soft at 90/50. Will have Dr Marlou Porch to review and call pt back with further instructions.

## 2021-04-05 NOTE — Telephone Encounter (Signed)
Pt c/o BP issue: STAT if pt c/o blurred vision, one-sided weakness or slurred speech  1. What are your last 5 BP readings? 159/88 this morning , yesterday it was 151/85  2. Are you having any other symptoms (ex. Dizziness, headache, blurred vision, passed out)? Headache from sinus , he thinks  3. What is your BP issue? Blood pressure have been high for him

## 2021-04-06 DIAGNOSIS — H02834 Dermatochalasis of left upper eyelid: Secondary | ICD-10-CM | POA: Diagnosis not present

## 2021-04-06 DIAGNOSIS — Z961 Presence of intraocular lens: Secondary | ICD-10-CM | POA: Diagnosis not present

## 2021-04-06 DIAGNOSIS — H26491 Other secondary cataract, right eye: Secondary | ICD-10-CM | POA: Diagnosis not present

## 2021-04-06 DIAGNOSIS — H02831 Dermatochalasis of right upper eyelid: Secondary | ICD-10-CM | POA: Diagnosis not present

## 2021-07-12 ENCOUNTER — Other Ambulatory Visit: Payer: Self-pay | Admitting: *Deleted

## 2021-07-12 MED ORDER — ROSUVASTATIN CALCIUM 20 MG PO TABS
20.0000 mg | ORAL_TABLET | Freq: Every day | ORAL | 11 refills | Status: DC
Start: 2021-07-12 — End: 2022-05-23

## 2021-07-13 DIAGNOSIS — M1120 Other chondrocalcinosis, unspecified site: Secondary | ICD-10-CM | POA: Diagnosis not present

## 2021-07-13 DIAGNOSIS — Z1589 Genetic susceptibility to other disease: Secondary | ICD-10-CM | POA: Diagnosis not present

## 2021-07-13 DIAGNOSIS — M1A09X0 Idiopathic chronic gout, multiple sites, without tophus (tophi): Secondary | ICD-10-CM | POA: Diagnosis not present

## 2021-07-13 DIAGNOSIS — R768 Other specified abnormal immunological findings in serum: Secondary | ICD-10-CM | POA: Diagnosis not present

## 2021-08-09 ENCOUNTER — Ambulatory Visit
Admission: RE | Admit: 2021-08-09 | Discharge: 2021-08-09 | Disposition: A | Discharge status: Home / Self Care | Payer: BC Managed Care – PPO | Source: Ambulatory Visit | Attending: Family Medicine | Admitting: Family Medicine

## 2021-08-09 ENCOUNTER — Other Ambulatory Visit: Payer: Self-pay | Admitting: Family Medicine

## 2021-08-09 DIAGNOSIS — Z23 Encounter for immunization: Secondary | ICD-10-CM | POA: Diagnosis not present

## 2021-08-09 DIAGNOSIS — S76311A Strain of muscle, fascia and tendon of the posterior muscle group at thigh level, right thigh, initial encounter: Secondary | ICD-10-CM

## 2021-08-09 DIAGNOSIS — M25551 Pain in right hip: Secondary | ICD-10-CM | POA: Diagnosis not present

## 2021-08-17 DIAGNOSIS — C44311 Basal cell carcinoma of skin of nose: Secondary | ICD-10-CM | POA: Diagnosis not present

## 2021-08-17 DIAGNOSIS — Z85828 Personal history of other malignant neoplasm of skin: Secondary | ICD-10-CM | POA: Diagnosis not present

## 2021-08-17 DIAGNOSIS — Z85820 Personal history of malignant melanoma of skin: Secondary | ICD-10-CM | POA: Diagnosis not present

## 2021-08-17 DIAGNOSIS — L570 Actinic keratosis: Secondary | ICD-10-CM | POA: Diagnosis not present

## 2021-08-17 DIAGNOSIS — L821 Other seborrheic keratosis: Secondary | ICD-10-CM | POA: Diagnosis not present

## 2021-08-17 DIAGNOSIS — L853 Xerosis cutis: Secondary | ICD-10-CM | POA: Diagnosis not present

## 2021-09-07 ENCOUNTER — Other Ambulatory Visit: Payer: Self-pay | Admitting: Cardiology

## 2021-09-13 ENCOUNTER — Other Ambulatory Visit: Payer: Self-pay | Admitting: Cardiology

## 2021-09-13 DIAGNOSIS — K219 Gastro-esophageal reflux disease without esophagitis: Secondary | ICD-10-CM

## 2021-09-20 DIAGNOSIS — C44311 Basal cell carcinoma of skin of nose: Secondary | ICD-10-CM | POA: Diagnosis not present

## 2021-11-17 DIAGNOSIS — M25571 Pain in right ankle and joints of right foot: Secondary | ICD-10-CM | POA: Diagnosis not present

## 2021-11-17 DIAGNOSIS — S92354A Nondisplaced fracture of fifth metatarsal bone, right foot, initial encounter for closed fracture: Secondary | ICD-10-CM | POA: Diagnosis not present

## 2021-12-01 ENCOUNTER — Ambulatory Visit
Admission: RE | Admit: 2021-12-01 | Discharge: 2021-12-01 | Disposition: A | Discharge status: Home / Self Care | Payer: Medicare Other | Source: Ambulatory Visit | Attending: Family Medicine | Admitting: Family Medicine

## 2021-12-01 ENCOUNTER — Other Ambulatory Visit: Payer: Self-pay | Admitting: Family Medicine

## 2021-12-01 DIAGNOSIS — M549 Dorsalgia, unspecified: Secondary | ICD-10-CM

## 2022-02-28 ENCOUNTER — Other Ambulatory Visit: Payer: Self-pay | Admitting: Cardiology

## 2022-02-28 DIAGNOSIS — I10 Essential (primary) hypertension: Secondary | ICD-10-CM

## 2022-02-28 MED ORDER — HYDROCHLOROTHIAZIDE 25 MG PO TABS: 25.0000 mg | ORAL_TABLET | Freq: Every day | ORAL | 0 refills | Status: DC

## 2022-02-28 NOTE — Addendum Note (Signed)
Addended by: Michelle Nasuti on: 02/28/2022 04:53 PM ? ? Modules accepted: Orders ? ?

## 2022-03-28 ENCOUNTER — Other Ambulatory Visit: Payer: Self-pay | Admitting: Cardiology

## 2022-03-31 ENCOUNTER — Other Ambulatory Visit: Payer: Self-pay | Admitting: Cardiology

## 2022-03-31 DIAGNOSIS — I10 Essential (primary) hypertension: Secondary | ICD-10-CM

## 2022-04-08 ENCOUNTER — Other Ambulatory Visit: Payer: Self-pay | Admitting: Cardiology

## 2022-04-08 DIAGNOSIS — I10 Essential (primary) hypertension: Secondary | ICD-10-CM

## 2022-04-17 ENCOUNTER — Other Ambulatory Visit: Payer: Self-pay | Admitting: Cardiology

## 2022-04-20 ENCOUNTER — Other Ambulatory Visit: Payer: Self-pay | Admitting: Cardiology

## 2022-05-01 ENCOUNTER — Other Ambulatory Visit: Payer: Self-pay | Admitting: Cardiology

## 2022-05-04 ENCOUNTER — Other Ambulatory Visit: Payer: Self-pay | Admitting: Cardiology

## 2022-05-04 DIAGNOSIS — I10 Essential (primary) hypertension: Secondary | ICD-10-CM

## 2022-05-20 ENCOUNTER — Other Ambulatory Visit: Payer: Self-pay | Admitting: Cardiology

## 2022-05-20 DIAGNOSIS — K219 Gastro-esophageal reflux disease without esophagitis: Secondary | ICD-10-CM

## 2022-05-20 DIAGNOSIS — I10 Essential (primary) hypertension: Secondary | ICD-10-CM

## 2022-05-22 NOTE — Progress Notes (Unsigned)
Office Visit    Patient Name: Robert Reilly Date of Encounter: 05/23/2022  PCP:  Alroy Dust, L.Marlou Sa, [place]  Cardiologist:  Candee Furbish, MD  Advanced Practice Provider:  No care team member to display Electrophysiologist:  None  Chief Complaint    Robert Reilly is a 81 y.o. male with a significant.  CAD status post PCI to RCA in 2020 by Dr. Ellyn Hack, hypertension, hyperlipidemia, GERD presents today for annual follow-up visit.  Robert Reilly was seen in May 2022 and was doing well at that time.  Robert Reilly did not have any chest pain.  Sometimes when Robert Reilly pushed himself Robert Reilly may feel an emptiness in his chest but no pain or pressure.  Today, Robert Reilly feels okay without any shortness of breath or chest pain.  His weight has been up a little due to an injury and not being able to walk as often as Robert Reilly would like.  Robert Reilly enjoys duck and [employer], especially with training the dogs.  Robert Reilly was trying to keep up with his grandson while out hunting 1 day who is 51 years old and Robert Reilly ended up twisting his ankle fracturing his toe it is still healing but Robert Reilly has been increasing his walking distance slowly as tolerated.  Robert Reilly also has some indigestion issues where Robert Reilly has had to take Prilosec a couple of times and this helps.  Robert Reilly cannot eat a lot of meat due to being bit by the [employer] tick.  When Robert Reilly does eat meat Robert Reilly usually has to take Benadryl because Robert Reilly gets welts that are a decent size.  Advised that it is best to stay away from red meat and stick to fish or chicken.  Reports no shortness of breath nor dyspnea on exertion. Reports no chest pain, pressure, or tightness. No edema, orthopnea, PND. Reports no palpitations.    Past Medical History    Past Medical History:  Diagnosis Date   Acute myocardial infarction of other inferior wall, subsequent episode of care    Cancer ([place])    melenoma  back , left arm   4-5 yrs ago   Coronary atherosclerosis of native coronary artery    GERD (gastroesophageal  reflux disease)    Gout    History of hiatal hernia    Hypercholesteremia    Hypertension    Old MI (myocardial infarction) 09/10/2013   2004, stent to RCA   Osteoarthritis    Other disorder of calcium metabolism    Tick bite 05/25/2016   states has had 5 in past month   Past Surgical History:  Procedure Laterality Date   CARDIAC CATHETERIZATION     CORONARY ANGIOPLASTY     stent x 1    12-15 yrs ago   CORONARY STENT INTERVENTION N/A 10/28/2019   Procedure: CORONARY STENT INTERVENTION;  Surgeon: Leonie Man, MD;  Location: [facility];  Service: Cardiovascular;  Laterality: N/A;   EYE SURGERY Bilateral    cataracts   INSERTION OF MESH N/A 05/30/2016   Procedure: INSERTION OF MESH;  Surgeon: Jackolyn Confer, MD;  Location: [facility];  Service: General;  Laterality: N/A;   LAPAROSCOPIC APPENDECTOMY N/A 02/28/2021   Procedure: APPENDECTOMY LAPAROSCOPIC;  Surgeon: Jesusita Oka, MD;  Location: [facility];  Service: General;  Laterality: N/A;   LEFT HEART CATH AND CORONARY ANGIOGRAPHY N/A 10/28/2019   Procedure: LEFT HEART CATH AND CORONARY ANGIOGRAPHY;  Surgeon: Leonie Man, MD;  Location: [facility];  Service: Cardiovascular;  Laterality: N/A;   MELANOMA EXCISION     back, left arm   SHOULDER ARTHROSCOPY WITH ROTATOR CUFF REPAIR Left    UMBILICAL HERNIA REPAIR N/A 05/30/2016   Procedure: UMBILICAL HERNIA REPAIR;  Surgeon: Jackolyn Confer, MD;  [facility];  Service: General;  Laterality: N/A;    Allergies  Allergies  Allergen Reactions   Tetracyclines & Related Rash   Other Other (See Comments)    No meat: Came from a tick bite      EKGs/Labs/Other Studies Reviewed:   The following studies were reviewed today:  Cath: 44/96/75   LV end diastolic pressure is mildly elevated. The left ventricular systolic function is normal. There is no aortic valve stenosis. CULPRIT LESION: Mid RCA lesion is 90% stenosed. A drug-eluting stent was successfully placed  using a STENT RESOLUTE ONYX 3.0X12. - post-dilated to 3.35 mm Post intervention, there is a 0% residual stenosis. Previously placed Mid RCA to Dist RCA stent (DES) is widely patent. Separate [place] for LAD & LCx   SUMMARY Severe Single Vessel CAD - mRCA 90% focal lesion upstream from m-dRCA stent Successful DES PCI of mRCA (Resolute Onyx DES 3.0 x 12 -> post-dilated to 3.35 mm) No significant disease in LAD & LCx systems - each wtih separate ostia. Likely preserved LVEF (inadequate LV Gram to assess wall motion) with mildly elevated LVEDP     RECOMMENDATIONS Overnight admission post PCI TR Band removal per protocol Continue to titrate IV NTG overnight for BP control ASA/Brilinta DAPT x min 6 months uninterrupted (ok to interrupt after 6 months) Ensure statin & Beta Blocker (pending HR monitoring overnight)     [name], MD, MS       TTE: 10/28/19 IMPRESSIONS     1. Left ventricular ejection fraction, by visual estimation, is 55 to 60%. The left ventricle has normal function. Left ventricular septal wall thickness was mildly increased. Mildly increased left ventricular posterior wall thickness. There is mildly  increased left ventricular hypertrophy.  2. Left ventricular diastolic parameters are consistent with Grade I diastolic dysfunction (impaired relaxation).  3. The left ventricle has no regional wall motion abnormalities.  4. Global right ventricle has normal systolic function.The right ventricular size is normal. No increase in right ventricular wall thickness.  5. Left atrial size was normal.  6. Right atrial size was normal.  7. The mitral valve is normal in structure. Trivial mitral valve regurgitation. No evidence of mitral stenosis.  8. The tricuspid valve is normal in structure.  9. The aortic valve is tricuspid. Aortic valve regurgitation is not visualized. No evidence of aortic valve sclerosis or stenosis. 10. The pulmonic valve was normal in structure. Pulmonic  valve regurgitation is not visualized. 11. TR signal is inadequate for assessing pulmonary artery systolic pressure. 12. The inferior vena cava is normal in size with <50% respiratory variability, suggesting right atrial pressure of 8 mmHg      EKG:  EKG is  ordered today.  The ekg ordered today demonstrates NSR, rate 78 bpm  Recent Labs: No results found for requested labs within last 365 days.  Recent Lipid Panel    Component Value Date/Time   CHOL 123 06/30/2020 0921   CHOL 142 03/05/2014 0944   TRIG 195 (H) 06/30/2020 0921   TRIG 199 (H) 03/05/2014 0944   HDL 35 (L) 06/30/2020 0921   HDL 38 (L) 03/05/2014 0944   CHOLHDL 3.5 06/30/2020 0921   CHOLHDL 4 03/15/2015 1018  VLDL 43.2 (H) 03/15/2015 1018   LDLCALC 56 06/30/2020 0921   LDLCALC 64 03/05/2014 0944   LDLDIRECT 76.0 03/15/2015 1018   Home Medications   Current Meds  Medication Sig   acetaminophen (TYLENOL) 500 MG tablet Take 500 mg by mouth every 6 (six) hours as needed for moderate pain or headache.   allopurinol (ZYLOPRIM) 100 MG tablet Take 100 mg by mouth 2 (two) times daily.    Ascorbic Acid (VITAMIN C) 100 MG tablet Take 100 mg by mouth daily.   aspirin EC 81 MG tablet Take 81 mg by mouth daily.   colchicine 0.6 MG tablet Take 0.6 mg by mouth every other day.    EPINEPHrine 0.3 mg/0.3 mL IJ SOAJ injection Inject 0.3 mg into the skin as needed.   Flaxseed, Linseed, (FLAX SEED OIL) 1000 MG CAPS Take 1,000 mg by mouth 2 (two) times daily.    loratadine (CLARITIN) 10 MG tablet Take 10 mg by mouth daily as needed for itching.   Multiple Vitamins-Minerals (AIRBORNE GUMMIES PO) Take 2 tablets by mouth daily.   nitroGLYCERIN (NITROSTAT) 0.4 MG SL tablet Place 1 tablet (0.4 mg total) under the tongue every 5 (five) minutes as needed for chest pain.   solifenacin (VESICARE) 5 MG tablet Take 5 mg by mouth daily.   Vitamin D, Cholecalciferol, 25 MCG (1000 UT) CAPS Take 1,000 Units by mouth daily.   zinc gluconate 50 MG  tablet Take 50 mg by mouth daily.   [DISCONTINUED] amLODipine (NORVASC) 2.5 MG tablet Take 1 tablet (2.5 mg total) by mouth daily. Please call [phone] to schedule an overdue appointment for future refills. Thank you. 1st attempt.   [DISCONTINUED] hydrochlorothiazide (HYDRODIURIL) 25 MG tablet TAKE 1 TABLET (25 MG TOTAL) BY MOUTH DAILY. NEED APPOINTMENT FOR REFILLS   [DISCONTINUED] metoprolol succinate (TOPROL-XL) 25 MG 24 hr tablet TAKE 1 TABLET BY MOUTH DAILY. TAKE WITH OR IMMEDIATELY FOLLOWING A MEAL. NEED APPOINTMENT FOR FURTHER REFILLS   [DISCONTINUED] omeprazole (PRILOSEC) 20 MG capsule TAKE 1 CAPSULE BY MOUTH 2 TIMES DAILY BEFORE A MEAL.   [DISCONTINUED] ramipril (ALTACE) 10 MG capsule TAKE 1 CAPSULE BY MOUTH DAILY.   [DISCONTINUED] rosuvastatin (CRESTOR) 20 MG tablet Take 1 tablet (20 mg total) by mouth at bedtime.     Review of Systems      All other systems reviewed and are otherwise negative except as noted above.  Physical Exam    VS:  BP 118/70   Pulse 78   Ht 5' 8.5" (1.74 m)   Wt 191 lb (86.6 kg)   SpO2 96%   BMI 28.62 kg/m  , BMI Body mass index is 28.62 kg/m.  Wt Readings from Last 3 Encounters:  05/23/22 191 lb (86.6 kg)  03/08/21 182 lb (82.6 kg)  02/26/21 182 lb (82.6 kg)     GEN: Well nourished, well developed, in no acute distress. HEENT: normal. Neck: Supple, no JVD, carotid bruits, or masses. Cardiac: RRR, no murmurs, rubs, or gallops. No clubbing, cyanosis, edema.  Radials/PT 2+ and equal bilaterally.  Respiratory:  Respirations regular and unlabored, clear to auscultation bilaterally. GI: Soft, nontender, nondistended. MS: No deformity or atrophy. Skin: Warm and dry, no rash. Neuro:  Strength and sensation are intact. Psych: Normal affect.  Assessment & Plan    CAD status post PCI -No chest pain or SOB today -Continue GDMT: Amlodipine 2.5 mg daily, aspirin 81 mg daily, HCTZ 25 mg daily, metoprolol succinate 25 mg daily, nitroglycerin as  needed, Crestor 20 mg  daily -Encouraged increased activity level.  Robert Reilly does walk a 2 and half miles a day but was walking 5 miles a day.  GERD -Still has indigestion from time to time.  Robert Reilly takes his Prilosec and it goes away.  Usually associated with meals.  Essential hypertension -Well-controlled today in the clinic.  Continue current medication regimen  Hyperlipidemia -Most recent lipid panel 12/2021 which showed LDL 46, HDL 30, total cholesterol 109, and triglycerides 205 -Robert Reilly is on flaxseed oil -Encouraged improvement in diet for lowering triglycerides  Appendicitis -mesh for a hernia  -no further issues         Disposition: Follow up 1 year  with Candee Furbish, MD or APP.  Signed, Elgie Collard, PA-C 05/23/2022, 3:15 PM [facility]

## 2022-05-23 ENCOUNTER — Encounter: Payer: Self-pay | Admitting: Physician Assistant

## 2022-05-23 ENCOUNTER — Ambulatory Visit (INDEPENDENT_AMBULATORY_CARE_PROVIDER_SITE_OTHER): Payer: Medicare Other | Admitting: Physician Assistant

## 2022-05-23 VITALS — BP 118/70 | HR 78 | Ht 68.5 in | Wt 191.0 lb

## 2022-05-23 DIAGNOSIS — I10 Essential (primary) hypertension: Secondary | ICD-10-CM | POA: Diagnosis not present

## 2022-05-23 DIAGNOSIS — Z955 Presence of coronary angioplasty implant and graft: Secondary | ICD-10-CM

## 2022-05-23 DIAGNOSIS — K219 Gastro-esophageal reflux disease without esophagitis: Secondary | ICD-10-CM

## 2022-05-23 DIAGNOSIS — E785 Hyperlipidemia, unspecified: Secondary | ICD-10-CM

## 2022-05-23 DIAGNOSIS — I25110 Atherosclerotic heart disease of native coronary artery with unstable angina pectoris: Secondary | ICD-10-CM | POA: Diagnosis not present

## 2022-05-23 DIAGNOSIS — K353 Acute appendicitis with localized peritonitis, without perforation or gangrene: Secondary | ICD-10-CM

## 2022-05-23 MED ORDER — RAMIPRIL 10 MG PO CAPS: ORAL_CAPSULE | ORAL | 3 refills | Status: DC

## 2022-05-23 MED ORDER — ROSUVASTATIN CALCIUM 20 MG PO TABS: 20.0000 mg | ORAL_TABLET | Freq: Every day | ORAL | 3 refills | Status: DC

## 2022-05-23 MED ORDER — METOPROLOL SUCCINATE ER 25 MG PO TB24: ORAL_TABLET | ORAL | 3 refills | Status: DC

## 2022-05-23 MED ORDER — AMLODIPINE BESYLATE 2.5 MG PO TABS: 2.5000 mg | ORAL_TABLET | Freq: Every day | ORAL | 3 refills | Status: DC

## 2022-05-23 MED ORDER — OMEPRAZOLE 20 MG PO CPDR: DELAYED_RELEASE_CAPSULE | ORAL | 3 refills | Status: DC

## 2022-05-23 MED ORDER — HYDROCHLOROTHIAZIDE 25 MG PO TABS: ORAL_TABLET | ORAL | 3 refills | Status: DC

## 2022-05-23 NOTE — Patient Instructions (Signed)
Medication Instructions:   Your physician recommends that you continue on your current medications as directed. Please refer to the Current Medication list given to you today.   *If you need a refill on your cardiac medications before your next appointment, please call your pharmacy*   Lab Work:  Your physician recommends that you return for a FASTING lipid profile/lft. Monday, July 1st 2024.  You can come in on the day of your appointment anytime between 7:30-4:30 fasting from midnight the night before.     If you have labs (blood work) drawn today and your tests are completely normal, you will receive your results only by: [place] (if you have MyChart) OR A paper copy in the mail If you have any lab test that is abnormal or we need to change your treatment, we will call you to review the results.   Testing/Procedures:  None ordered.   Follow-Up: At [facility], you and your health needs are our priority.  As part of our continuing mission to provide you with exceptional heart care, we have created designated Provider Care Teams.  These Care Teams include your primary Cardiologist (physician) and Advanced Practice Providers (APPs -  Physician Assistants and Nurse Practitioners) who all work together to provide you with the care you need, when you need it.  We recommend signing up for the patient portal called "MyChart".  Sign up information is provided on this After Visit Summary.  MyChart is used to connect with patients for Virtual Visits (Telemedicine).  Patients are able to view lab/test results, encounter notes, upcoming appointments, etc.  Non-urgent messages can be sent to your provider as well.   To learn more about what you can do with MyChart, go to [email or web address].    Your next appointment:   1 year(s)  The format for your next appointment:   In Person  Provider:   Nicholes Rough, PA-C         Other Instructions  Your physician wants you to  follow-up in: 1 year with Nicholes Rough, PA.  You will receive a reminder letter in the mail two months in advance. If you don't receive a letter, please call our office to schedule the follow-up appointment.   Important Information About Sugar

## 2022-08-07 ENCOUNTER — Ambulatory Visit
Admission: EM | Admit: 2022-08-07 | Discharge: 2022-08-07 | Disposition: A | Discharge status: Home / Self Care | Payer: Medicare Other | Attending: Physician Assistant | Admitting: Physician Assistant

## 2022-08-07 DIAGNOSIS — W57XXXA Bitten or stung by nonvenomous insect and other nonvenomous arthropods, initial encounter: Secondary | ICD-10-CM | POA: Diagnosis not present

## 2022-08-07 DIAGNOSIS — R21 Rash and other nonspecific skin eruption: Secondary | ICD-10-CM

## 2022-08-07 MED ORDER — TRIAMCINOLONE ACETONIDE 0.1 % EX CREA: 1.0000 | TOPICAL_CREAM | Freq: Two times a day (BID) | CUTANEOUS | 0 refills | Status: AC

## 2022-08-07 MED ORDER — METHYLPREDNISOLONE SODIUM SUCC 125 MG IJ SOLR
80.0000 mg | Freq: Once | INTRAMUSCULAR | Status: AC
  Administered 2022-08-07: 80 mg via INTRAMUSCULAR

## 2022-08-07 NOTE — ED Triage Notes (Signed)
Pt presents with rash on lower abdomen that is painful and itchy X 2 weeks.

## 2022-08-07 NOTE — ED Provider Notes (Signed)
[facility]    CSN: [id number] Arrival date & time: 08/07/22  1347      History   Chief Complaint Chief Complaint  Patient presents with   Rash    HPI Shaheen Mende Jue is a 81 y.o. male.   Here today for evaluation of rash to lower abdomen, a few scattered lesions on arms, and spread to ankles that has been present for 2 weeks.  He reports that rash is itchy and also somewhat painful.  He notes a burning sensation to lesions at times.  He has tried topical over-the-counter itch cream without significant relief.  He is unsure if he has been bit by anything but states he does spend a significant mount of time outside.  His wife does not have similar rash.    The history is provided by the patient.  Rash Associated symptoms: no fever and no shortness of breath     Past Medical History:  Diagnosis Date   Acute myocardial infarction of other inferior wall, subsequent episode of care    Cancer ([place])    melenoma  back , left arm   4-5 yrs ago   Coronary atherosclerosis of native coronary artery    GERD (gastroesophageal reflux disease)    Gout    History of hiatal hernia    Hypercholesteremia    Hypertension    Old MI (myocardial infarction) 09/10/2013   2004, stent to RCA   Osteoarthritis    Other disorder of calcium metabolism    Tick bite 05/25/2016   states has had 5 in past month    Patient Active Problem List   Diagnosis Date Noted   Acute appendicitis 02/26/2021   Hyperlipidemia 10/29/2019   Unstable angina ([place]) 10/28/2019   Old MI (myocardial infarction) 09/10/2013   Coronary artery disease involving native coronary artery of native heart with unstable angina pectoris ([place]) 09/10/2013   RA (rheumatoid arthritis) ([place]) 09/10/2013   HTN (hypertension) 09/10/2013    Past Surgical History:  Procedure Laterality Date   CARDIAC CATHETERIZATION     CORONARY ANGIOPLASTY     stent x 1    12-15 yrs ago   CORONARY STENT INTERVENTION N/A 10/28/2019    Procedure: CORONARY STENT INTERVENTION;  Surgeon: Leonie Man, MD;  Location: [facility] [place] [facility];  Service: Cardiovascular;  Laterality: N/A;   EYE SURGERY Bilateral    cataracts   INSERTION OF MESH N/A 05/30/2016   Procedure: INSERTION OF MESH;  Surgeon: Jackolyn Confer, MD;  [facility];  Service: General;  Laterality: N/A;   LAPAROSCOPIC APPENDECTOMY N/A 02/28/2021   Procedure: APPENDECTOMY LAPAROSCOPIC;  Surgeon: Jesusita Oka, MD;  Location: [facility];  Service: General;  Laterality: N/A;   LEFT HEART CATH AND CORONARY ANGIOGRAPHY N/A 10/28/2019   Procedure: LEFT HEART CATH AND CORONARY ANGIOGRAPHY;  Surgeon: Leonie Man, MD;  Location: [facility];  Service: Cardiovascular;  Laterality: N/A;   MELANOMA EXCISION     back, left arm   SHOULDER ARTHROSCOPY WITH ROTATOR CUFF REPAIR Left    UMBILICAL HERNIA REPAIR N/A 05/30/2016   Procedure: UMBILICAL HERNIA REPAIR;  Surgeon: Jackolyn Confer, MD;  [facility];  Service: General;  Laterality: N/A;       Home Medications    Prior to Admission medications   Medication Sig Start Date End Date Taking? Authorizing Provider  triamcinolone cream (KENALOG) 0.1 % Apply 1 Application topically 2 (two) times daily. 08/07/22  Yes Francene Finders, PA-C  acetaminophen (TYLENOL) 500 MG tablet Take 500 mg by mouth every 6 (six) hours as needed for moderate pain or headache.    [provider]  allopurinol (ZYLOPRIM) 100 MG tablet Take 100 mg by mouth 2 (two) times daily.  02/13/15   [provider]  amLODipine (NORVASC) 2.5 MG tablet Take 1 tablet (2.5 mg total) by mouth daily. 05/23/22 05/24/23  Elgie Collard, PA-C  Ascorbic Acid (VITAMIN C) 100 MG tablet Take 100 mg by mouth daily.    [provider]  aspirin EC 81 MG tablet Take 81 mg by mouth daily.    [provider]  colchicine 0.6 MG tablet Take 0.6 mg by mouth every other day.     [provider]  EPINEPHrine 0.3 mg/0.3 mL IJ SOAJ  injection Inject 0.3 mg into the skin as needed.    [provider]  Flaxseed, Linseed, (FLAX SEED OIL) 1000 MG CAPS Take 1,000 mg by mouth 2 (two) times daily.     [provider]  hydrochlorothiazide (HYDRODIURIL) 25 MG tablet TAKE 1 TABLET (25 MG TOTAL) BY MOUTH DAILY. 05/23/22   Elgie Collard, PA-C  loratadine (CLARITIN) 10 MG tablet Take 10 mg by mouth daily as needed for itching.    [provider]  metoprolol succinate (TOPROL-XL) 25 MG 24 hr tablet TAKE  (1) TABLET BY MOUTH (25 mg) daily. 05/23/22 05/24/23  Elgie Collard, PA-C  Multiple Vitamins-Minerals (AIRBORNE GUMMIES PO) Take 2 tablets by mouth daily.    [provider]  nitroGLYCERIN (NITROSTAT) 0.4 MG SL tablet Place 1 tablet (0.4 mg total) under the tongue every 5 (five) minutes as needed for chest pain. 02/13/18   Burtis Junes, NP  omeprazole (PRILOSEC) 20 MG capsule TAKE 1 CAPSULE BY MOUTH 2 TIMES DAILY BEFORE A MEAL. 05/23/22 05/24/23  Elgie Collard, PA-C  ramipril (ALTACE) 10 MG capsule Take one (1) tablet by mouth ( 10 mg) daily. 05/23/22   Elgie Collard, PA-C  rosuvastatin (CRESTOR) 20 MG tablet Take 1 tablet (20 mg total) by mouth at bedtime. 05/23/22 05/24/23  Elgie Collard, PA-C  solifenacin (VESICARE) 5 MG tablet Take 5 mg by mouth daily.    [provider]  Vitamin D, Cholecalciferol, 25 MCG (1000 UT) CAPS Take 1,000 Units by mouth daily.    [provider]  zinc gluconate 50 MG tablet Take 50 mg by mouth daily.    [provider]    Family History Family History  Problem Relation Age of Onset   Stroke Mother    Cancer Father     Social History Social History   Tobacco Use   Smoking status: Former    Types: Cigarettes    Quit date: 05/25/2001    Years since quitting: 21.2   Smokeless tobacco: Former  Scientific laboratory technician Use: Never used  Substance Use Topics   Alcohol use: Yes    Comment: 2-3 drinks week- gin   Drug use: No     Allergies    Tetracyclines & related and Other   Review of Systems Review of Systems  Constitutional:  Negative for chills and fever.  Eyes:  Negative for discharge and redness.  Respiratory:  Negative for shortness of breath.   Skin:  Positive for rash.     Physical Exam Triage Vital Signs ED Triage Vitals  Enc Vitals Group     BP 08/07/22 1454 121/77     Pulse Rate 08/07/22 1454  69     Resp 08/07/22 1454 17     Temp 08/07/22 1454 98 F (36.7 C)     Temp Source 08/07/22 1454 Oral     SpO2 08/07/22 1454 94 %     Weight --      Height --      Head Circumference --      Peak Flow --      Pain Score 08/07/22 1453 4     Pain Loc --      Pain Edu? --      Excl. in [place]? --    No data found.  Updated Vital Signs BP 121/77 (BP Location: Left Arm)   Pulse 69   Temp 98 F (36.7 C) (Oral)   Resp 17   SpO2 94%      Physical Exam Vitals and nursing note reviewed.  Constitutional:      General: He is not in acute distress.    Appearance: Normal appearance. He is not ill-appearing.  HENT:     Head: Normocephalic and atraumatic.  Eyes:     Conjunctiva/sclera: Conjunctivae normal.  Cardiovascular:     Rate and Rhythm: Normal rate.  Pulmonary:     Effort: Pulmonary effort is normal. No respiratory distress.  Skin:    Comments: Scattered erythematous papular lesions to lower abdomen, upper inner left arm, bilateral ankles  Neurological:     Mental Status: He is alert.  Psychiatric:        Mood and Affect: Mood normal.        Behavior: Behavior normal.        Thought Content: Thought content normal.      UC Treatments / Results  Labs (all labs ordered are listed, but only abnormal results are displayed) Labs Reviewed - No data to display  EKG   Radiology No results found.  Procedures Procedures (including critical care time)  Medications Ordered in UC Medications  methylPREDNISolone sodium succinate (SOLU-MEDROL) 125 mg/2 mL injection 80 mg (80 mg Intramuscular  Given 08/07/22 1547)    Initial Impression / Assessment and Plan / UC Course  I have reviewed the triage vital signs and the nursing notes.  Pertinent labs & imaging results that were available during my care of the patient were reviewed by me and considered in my medical decision making (see chart for details).    Lesions appear consistent with bites of some sort- discussed possibility of chigger bites given appearance and time outside. Recommended steroid injection (patient prefers to avoid oral steroids) in office and kenalog cream with follow up if no improvement or any further concerns.   Final Clinical Impressions(s) / UC Diagnoses   Final diagnoses:  Insect bite, unspecified site, initial encounter   Discharge Instructions   None    ED Prescriptions     Medication Sig Dispense Auth. Provider   triamcinolone cream (KENALOG) 0.1 % Apply 1 Application topically 2 (two) times daily. 30 g Francene Finders, PA-C      PDMP not reviewed this encounter.   Francene Finders, PA-C 08/07/22 1733

## 2023-01-25 DIAGNOSIS — G8929 Other chronic pain: Secondary | ICD-10-CM | POA: Insufficient documentation

## 2023-04-30 ENCOUNTER — Ambulatory Visit: Payer: Medicare Other | Attending: Physician Assistant

## 2023-04-30 DIAGNOSIS — K353 Acute appendicitis with localized peritonitis, without perforation or gangrene: Secondary | ICD-10-CM

## 2023-04-30 DIAGNOSIS — I10 Essential (primary) hypertension: Secondary | ICD-10-CM

## 2023-04-30 DIAGNOSIS — I25110 Atherosclerotic heart disease of native coronary artery with unstable angina pectoris: Secondary | ICD-10-CM

## 2023-04-30 DIAGNOSIS — Z955 Presence of coronary angioplasty implant and graft: Secondary | ICD-10-CM

## 2023-04-30 DIAGNOSIS — E785 Hyperlipidemia, unspecified: Secondary | ICD-10-CM

## 2023-04-30 LAB — HEPATIC FUNCTION PANEL
ALT: 26 IU/L (ref 0–44)
AST: 23 IU/L (ref 0–40)
Albumin: 4 g/dL (ref 3.8–4.8)
Alkaline Phosphatase: 72 IU/L (ref 44–121)
Bilirubin Total: 0.3 mg/dL (ref 0.0–1.2)
Bilirubin, Direct: 0.12 mg/dL (ref 0.00–0.40)
Total Protein: 6.8 g/dL (ref 6.0–8.5)

## 2023-04-30 LAB — LIPID PANEL
Chol/HDL Ratio: 3.8 ratio (ref 0.0–5.0)
Cholesterol, Total: 127 mg/dL (ref 100–199)
HDL: 33 mg/dL — ABNORMAL LOW (ref >39)
LDL Chol Calc (NIH): 62 mg/dL (ref 0–99)
Triglycerides: 189 mg/dL — ABNORMAL HIGH (ref 0–149)
VLDL Cholesterol Cal: 32 mg/dL (ref 5–40)

## 2023-05-04 ENCOUNTER — Other Ambulatory Visit: Payer: Self-pay | Admitting: *Deleted

## 2023-05-04 ENCOUNTER — Telehealth: Payer: Self-pay | Admitting: Physician Assistant

## 2023-05-04 DIAGNOSIS — Z79899 Other long term (current) drug therapy: Secondary | ICD-10-CM

## 2023-05-04 DIAGNOSIS — E785 Hyperlipidemia, unspecified: Secondary | ICD-10-CM

## 2023-05-04 MED ORDER — FENOFIBRATE 160 MG PO TABS: 160.0000 mg | ORAL_TABLET | Freq: Every day | ORAL | 3 refills | Status: DC

## 2023-05-04 NOTE — Telephone Encounter (Signed)
Pt returning call in regards to lab results.  

## 2023-05-04 NOTE — Telephone Encounter (Signed)
Spoke with patient, see lab results for details.

## 2023-05-09 DIAGNOSIS — I10 Essential (primary) hypertension: Secondary | ICD-10-CM | POA: Insufficient documentation

## 2023-05-09 DIAGNOSIS — I2510 Atherosclerotic heart disease of native coronary artery without angina pectoris: Secondary | ICD-10-CM | POA: Insufficient documentation

## 2023-05-09 DIAGNOSIS — M109 Gout, unspecified: Secondary | ICD-10-CM | POA: Insufficient documentation

## 2023-05-09 DIAGNOSIS — Z91018 Allergy to other foods: Secondary | ICD-10-CM | POA: Insufficient documentation

## 2023-06-18 ENCOUNTER — Other Ambulatory Visit: Payer: Self-pay | Admitting: Physician Assistant

## 2023-06-19 DIAGNOSIS — C672 Malignant neoplasm of lateral wall of bladder: Secondary | ICD-10-CM | POA: Insufficient documentation

## 2023-06-19 NOTE — Telephone Encounter (Signed)
Patient of Dr. Marlou Porch. Please review for refill. Thank you!

## 2023-06-20 ENCOUNTER — Telehealth: Payer: Self-pay | Admitting: Cardiology

## 2023-06-20 MED ORDER — RAMIPRIL 10 MG PO CAPS: ORAL_CAPSULE | ORAL | 0 refills | Status: DC

## 2023-06-20 NOTE — Telephone Encounter (Signed)
*  STAT* If patient is at the pharmacy, call can be transferred to refill team.   1. Which medications need to be refilled? (please list name of each medication and dose if known)   ramipril (ALTACE) 10 MG capsule   2. Would you like to learn more about the convenience, safety, & potential cost savings by using the [facility]?   3. Are you open to using the [facility] (Type [facility]. ).  4. Which pharmacy/location (including street and city if local pharmacy) is medication to be sent to?  [facility] - [place], [place] - [address]   5. Do they need a 30 day or 90 day supply?  90 day  Patient stated he has some medication left.  Patient has appointment scheduled 8/22.

## 2023-06-20 NOTE — Progress Notes (Unsigned)
Office Visit    Patient Name: Aurelius Holl Perreira Date of Encounter: 06/21/2023  PCP:  Irven Coe, MD   [facility]  Cardiologist:  Donato Schultz, MD  Advanced Practice Provider:  No care team member to display Electrophysiologist:  None  Chief Complaint    Kasyn Brau Baldassari is a 82 y.o. male with a significant.  CAD status post PCI to RCA in 2020 by Dr. Herbie Baltimore, hypertension, hyperlipidemia, GERD presents today for annual follow-up visit.  He was seen in May 2022 and was doing well at that time.  He did not have any chest pain.  Sometimes when he pushed himself he may feel an emptiness in his chest but no pain or pressure.  He last saw me 05/23/2022, he feels okay without any shortness of breath or chest pain.  His weight has been up a little due to an injury and not being able to walk as often as he would like.  He enjoys duck and [employer], especially with training the dogs.  He was trying to keep up with his grandson while out hunting 1 day who is 20 years old and he ended up twisting his ankle fracturing his toe it is still healing but he has been increasing his walking distance slowly as tolerated.  He also has some indigestion issues where he has had to take Prilosec a couple of times and this helps.  He cannot eat a lot of meat due to being bit by the [employer] tick.  When he does eat meat he usually has to take Benadryl because he gets welts that are a decent size.  Advised that it is best to stay away from red meat and stick to fish or chicken.  Today, he tells me he was recently diagnosed with bladder cancer.  Fortunately has not spread anywhere else.  No damage to his bladder.  It was found by him having too much protein in his urine.  Thought he also saw blood as well.  Did an ultrasound and found a mass.  He does not have much of an appetite and has been losing weight.  Tries to eat but cannot.  Taste buds have changed.  Discussed incorporating a protein drink like  Ensure or boost.  Heart rate is 50 so we discontinued metoprolol at this time.  He started on chemotherapy (locally in his bladder) and will find out if he needs anything systemic when he next sees his oncologist.  He played golf on Wednesday and felt like he "gave out".  Was likely dehydrated in the heat of the day.  Discussed 64 ounces of water daily and small snacks throughout the day as able.   Reports no shortness of breath nor dyspnea on exertion. Reports no chest pain, pressure, or tightness. No edema, orthopnea, PND. Reports no palpitations.     Past Medical History    Past Medical History:  Diagnosis Date   Acute myocardial infarction of other inferior wall, subsequent episode of care    Cancer (HCC)    melenoma  back , left arm   4-5 yrs ago   Coronary atherosclerosis of native coronary artery    GERD (gastroesophageal reflux disease)    Gout    History of hiatal hernia    Hypercholesteremia    Hypertension    Old MI (myocardial infarction) 09/10/2013   2004, stent to RCA   Osteoarthritis    Other disorder of calcium metabolism  Tick bite 05/25/2016   states has had 5 in past month   Past Surgical History:  Procedure Laterality Date   CARDIAC CATHETERIZATION     CORONARY ANGIOPLASTY     stent x 1    12-15 yrs ago   CORONARY STENT INTERVENTION N/A 10/28/2019   Procedure: CORONARY STENT INTERVENTION;  Surgeon: Marykay Lex, MD;  Location: [facility] INVASIVE CV LAB;  Service: Cardiovascular;  Laterality: N/A;   EYE SURGERY Bilateral    cataracts   INSERTION OF MESH N/A 05/30/2016   Procedure: INSERTION OF MESH;  Surgeon: Avel Peace, MD;  Location: [facility] ORS;  Service: General;  Laterality: N/A;   LAPAROSCOPIC APPENDECTOMY N/A 02/28/2021   Procedure: APPENDECTOMY LAPAROSCOPIC;  Surgeon: Diamantina Monks, MD;  Location: [facility] OR;  Service: General;  Laterality: N/A;   LEFT HEART CATH AND CORONARY ANGIOGRAPHY N/A 10/28/2019   Procedure: LEFT HEART CATH AND CORONARY ANGIOGRAPHY;   Surgeon: Marykay Lex, MD;  Location: [facility] INVASIVE CV LAB;  Service: Cardiovascular;  Laterality: N/A;   MELANOMA EXCISION     back, left arm   SHOULDER ARTHROSCOPY WITH ROTATOR CUFF REPAIR Left    UMBILICAL HERNIA REPAIR N/A 05/30/2016   Procedure: UMBILICAL HERNIA REPAIR;  Surgeon: Avel Peace, MD;  Location: [facility] ORS;  Service: General;  Laterality: N/A;    Allergies  Allergies  Allergen Reactions   Tetracyclines & Related Rash   Other Other (See Comments)    No meat: Came from a tick bite      EKGs/Labs/Other Studies Reviewed:   The following studies were reviewed today:  Cath: 10/28/19   LV end diastolic pressure is mildly elevated. The left ventricular systolic function is normal. There is no aortic valve stenosis. CULPRIT LESION: Mid RCA lesion is 90% stenosed. A drug-eluting stent was successfully placed using a STENT RESOLUTE ONYX 3.0X12. - post-dilated to 3.35 mm Post intervention, there is a 0% residual stenosis. Previously placed Mid RCA to Dist RCA stent (DES) is widely patent. Separate [place]ia for LAD & LCx   SUMMARY Severe Single Vessel CAD - mRCA 90% focal lesion upstream from m-dRCA stent Successful DES PCI of mRCA (Resolute Onyx DES 3.0 x 12 -> post-dilated to 3.35 mm) No significant disease in LAD & LCx systems - each wtih separate ostia. Likely preserved LVEF (inadequate LV Gram to assess wall motion) with mildly elevated LVEDP     RECOMMENDATIONS Overnight admission post PCI TR Band removal per protocol Continue to titrate IV NTG overnight for BP control ASA/Brilinta DAPT x min 6 months uninterrupted (ok to interrupt after 6 months) Ensure statin & Beta Blocker (pending HR monitoring overnight)     Bryan Lemma, MD, MS       TTE: 10/28/19 IMPRESSIONS     1. Left ventricular ejection fraction, by visual estimation, is 55 to 60%. The left ventricle has normal function. Left ventricular septal wall thickness was mildly increased. Mildly  increased left ventricular posterior wall thickness. There is mildly  increased left ventricular hypertrophy.  2. Left ventricular diastolic parameters are consistent with Grade I diastolic dysfunction (impaired relaxation).  3. The left ventricle has no regional wall motion abnormalities.  4. Global right ventricle has normal systolic function.The right ventricular size is normal. No increase in right ventricular wall thickness.  5. Left atrial size was normal.  6. Right atrial size was normal.  7. The mitral valve is normal in structure. Trivial mitral valve regurgitation. No evidence of mitral stenosis.  8. The tricuspid valve  is normal in structure.  9. The aortic valve is tricuspid. Aortic valve regurgitation is not visualized. No evidence of aortic valve sclerosis or stenosis. 10. The pulmonic valve was normal in structure. Pulmonic valve regurgitation is not visualized. 11. TR signal is inadequate for assessing pulmonary artery systolic pressure. 12. The inferior vena cava is normal in size with <50% respiratory variability, suggesting right atrial pressure of 8 mmHg      EKG:  EKG is  ordered today.  The ekg ordered today demonstrates NSR, rate 78 bpm  Recent Labs: 04/30/2023: ALT 26  Recent Lipid Panel    Component Value Date/Time   CHOL 127 04/30/2023 0843   CHOL 142 03/05/2014 0944   TRIG 189 (H) 04/30/2023 0843   TRIG 199 (H) 03/05/2014 0944   HDL 33 (L) 04/30/2023 0843   HDL 38 (L) 03/05/2014 0944   CHOLHDL 3.8 04/30/2023 0843   CHOLHDL 4 03/15/2015 1018   VLDL 43.2 (H) 03/15/2015 1018   LDLCALC 62 04/30/2023 0843   LDLCALC 64 03/05/2014 0944   LDLDIRECT 76.0 03/15/2015 1018   Home Medications   Current Meds  Medication Sig   acetaminophen (TYLENOL) 500 MG tablet Take 500 mg by mouth every 6 (six) hours as needed for moderate pain or headache.   allopurinol (ZYLOPRIM) 100 MG tablet Take 100 mg by mouth 2 (two) times daily.    Ascorbic Acid (VITAMIN C) 100 MG  tablet Take 100 mg by mouth daily.   aspirin EC 81 MG tablet Take 81 mg by mouth daily.   colchicine 0.6 MG tablet Take 0.6 mg by mouth every other day.    EPINEPHrine 0.3 mg/0.3 mL IJ SOAJ injection Inject 0.3 mg into the skin as needed.   Flaxseed, Linseed, (FLAX SEED OIL) 1000 MG CAPS Take 1,000 mg by mouth 2 (two) times daily.    loratadine (CLARITIN) 10 MG tablet Take 10 mg by mouth daily as needed for itching.   Multiple Vitamins-Minerals (AIRBORNE GUMMIES PO) Take 2 tablets by mouth daily.   nitroGLYCERIN (NITROSTAT) 0.4 MG SL tablet Place 1 tablet (0.4 mg total) under the tongue every 5 (five) minutes as needed for chest pain.   solifenacin (VESICARE) 5 MG tablet Take 5 mg by mouth daily.   Vitamin D, Cholecalciferol, 25 MCG (1000 UT) CAPS Take 1,000 Units by mouth daily.   zinc gluconate 50 MG tablet Take 50 mg by mouth daily.   [DISCONTINUED] amLODipine (NORVASC) 2.5 MG tablet Take 1 tablet (2.5 mg total) by mouth daily. Please call [phone] to schedule an overdue appointment for future refills. Thank you. 1st attempt.   [DISCONTINUED] hydrochlorothiazide (HYDRODIURIL) 25 MG tablet TAKE 1 TABLET (25 MG TOTAL) BY MOUTH DAILY. NEED APPOINTMENT FOR REFILLS   [DISCONTINUED] metoprolol succinate (TOPROL-XL) 25 MG 24 hr tablet TAKE 1 TABLET BY MOUTH DAILY. TAKE WITH OR IMMEDIATELY FOLLOWING A MEAL. NEED APPOINTMENT FOR FURTHER REFILLS   [DISCONTINUED] omeprazole (PRILOSEC) 20 MG capsule TAKE 1 CAPSULE BY MOUTH 2 TIMES DAILY BEFORE A MEAL.   [DISCONTINUED] ramipril (ALTACE) 10 MG capsule TAKE 1 CAPSULE BY MOUTH DAILY.   [DISCONTINUED] rosuvastatin (CRESTOR) 20 MG tablet Take 1 tablet (20 mg total) by mouth at bedtime.     Review of Systems      All other systems reviewed and are otherwise negative except as noted above.  Physical Exam    VS:  BP 110/64   Pulse (!) 50   Ht 5' 8.5" (1.74 m)   Wt 188 lb 8  oz (85.5 kg)   SpO2 98%   BMI 28.24 kg/m  , BMI Body mass index is 28.24  kg/m.  Wt Readings from Last 3 Encounters:  06/21/23 188 lb 8 oz (85.5 kg)  05/23/22 191 lb (86.6 kg)  03/08/21 182 lb (82.6 kg)     GEN: Well nourished, well developed, in no acute distress. HEENT: normal. Neck: Supple, no JVD, carotid bruits, or masses. Cardiac: RRR, no murmurs, rubs, or gallops. No clubbing, cyanosis, edema.  Radials/PT 2+ and equal bilaterally.  Respiratory:  Respirations regular and unlabored, clear to auscultation bilaterally. GI: Soft, nontender, nondistended. MS: No deformity or atrophy. Skin: Warm and dry, no rash. Neuro:  Strength and sensation are intact. Psych: Normal affect.  Assessment & Plan    CAD status post PCI -No chest pain or SOB today -Continue GDMT: Amlodipine 2.5 mg daily, aspirin 81 mg daily, HCTZ 25 mg daily, stop metoprolol succinate due to bradycardia, nitroglycerin as needed, Crestor 20 mg daily   GERD -Still has indigestion from time to time.  He takes his Prilosec and it goes away.  Usually associated with meals.  Essential hypertension -Well-controlled today in the clinic.  Continue current medication regimen -track at home  Hyperlipidemia -Most recent lipid panel 12/2022 which showed LDL 62, HDL 33, total cholesterol 161, and triglycerides 189 -he is on a low sugar diet -He is on flaxseed oil 1,000 BID -Encouraged improvement in diet for lowering triglycerides  Appendicitis -mesh for a hernia  -no further issues   Disposition: Follow up 1 year  with Donato Schultz, MD or APP.  Signed, Sharlene Dory, PA-C 06/21/2023, 5:10 PM [facility]

## 2023-06-20 NOTE — Telephone Encounter (Signed)
Pt's medication was sent to pt's pharmacy as requested. Confirmation received.  °

## 2023-06-21 ENCOUNTER — Ambulatory Visit: Payer: Medicare Other | Attending: Physician Assistant | Admitting: Physician Assistant

## 2023-06-21 ENCOUNTER — Encounter: Payer: Self-pay | Admitting: Physician Assistant

## 2023-06-21 VITALS — BP 110/64 | HR 50 | Ht 68.5 in | Wt 188.5 lb

## 2023-06-21 DIAGNOSIS — I2510 Atherosclerotic heart disease of native coronary artery without angina pectoris: Secondary | ICD-10-CM | POA: Diagnosis not present

## 2023-06-21 DIAGNOSIS — K219 Gastro-esophageal reflux disease without esophagitis: Secondary | ICD-10-CM | POA: Diagnosis not present

## 2023-06-21 DIAGNOSIS — I10 Essential (primary) hypertension: Secondary | ICD-10-CM | POA: Diagnosis not present

## 2023-06-21 DIAGNOSIS — E785 Hyperlipidemia, unspecified: Secondary | ICD-10-CM | POA: Insufficient documentation

## 2023-06-21 DIAGNOSIS — K353 Acute appendicitis with localized peritonitis, without perforation or gangrene: Secondary | ICD-10-CM | POA: Insufficient documentation

## 2023-06-21 NOTE — Patient Instructions (Signed)
Medication Instructions:  Your physician has recommended you make the following change in your medication:   STOP Metoprolol  *If you need a refill on your cardiac medications before your next appointment, please call your pharmacy*   Lab Work: None ordered  If you have labs (blood work) drawn today and your tests are completely normal, you will receive your results only by: MyChart Message (if you have MyChart) OR A paper copy in the mail If you have any lab test that is abnormal or we need to change your treatment, we will call you to review the results.   Testing/Procedures: None ordered   Follow-Up: At [facility], you and your health needs are our priority.  As part of our continuing mission to provide you with exceptional heart care, we have created designated Provider Care Teams.  These Care Teams include your primary Cardiologist (physician) and Advanced Practice Providers (APPs -  Physician Assistants and Nurse Practitioners) who all work together to provide you with the care you need, when you need it.  We recommend signing up for the patient portal called "MyChart".  Sign up information is provided on this After Visit Summary.  MyChart is used to connect with patients for Virtual Visits (Telemedicine).  Patients are able to view lab/test results, encounter notes, upcoming appointments, etc.  Non-urgent messages can be sent to your provider as well.   To learn more about what you can do with MyChart, go to [email or web address].    Your next appointment:   1 year(s)  Provider:   Donato Schultz, MD  or Jari Favre, PA-C         Other Instructions Make sure to get you some Protein Supplements, either Ensure or Boost  Your physician has requested that you regularly monitor and record your blood pressure readings at home. Please use the same machine at the same time of day to check your readings and record them to bring to your follow-up visit.   Please monitor  blood pressures and keep a log of your readings for 2 weeks then either call them into the office or send via mychart.    Make sure to check 2 hours after your medications.    AVOID these things for 30 minutes before checking your blood pressure: No Drinking caffeine. No Drinking alcohol. No Eating. No Smoking. No Exercising.   Five minutes before checking your blood pressure: Pee. Sit in a dining chair. Avoid sitting in a soft couch or armchair. Be quiet. Do not talk

## 2023-07-03 ENCOUNTER — Ambulatory Visit: Payer: Medicare Other | Attending: Family Medicine

## 2023-07-03 ENCOUNTER — Other Ambulatory Visit: Payer: Self-pay | Admitting: Physician Assistant

## 2023-07-03 DIAGNOSIS — I10 Essential (primary) hypertension: Secondary | ICD-10-CM

## 2023-07-03 NOTE — Telephone Encounter (Signed)
Patient of Dr. Marlou Porch. Please review for refill. Thank you!

## 2023-07-19 ENCOUNTER — Ambulatory Visit
Admission: EM | Admit: 2023-07-19 | Discharge: 2023-07-19 | Disposition: A | Discharge status: Home / Self Care | Payer: Medicare Other | Attending: Family Medicine | Admitting: Family Medicine

## 2023-07-19 ENCOUNTER — Other Ambulatory Visit: Payer: Self-pay | Admitting: Physician Assistant

## 2023-07-19 ENCOUNTER — Telehealth: Payer: Self-pay | Admitting: Cardiology

## 2023-07-19 DIAGNOSIS — C679 Malignant neoplasm of bladder, unspecified: Secondary | ICD-10-CM | POA: Insufficient documentation

## 2023-07-19 DIAGNOSIS — N390 Urinary tract infection, site not specified: Secondary | ICD-10-CM | POA: Insufficient documentation

## 2023-07-19 DIAGNOSIS — N3289 Other specified disorders of bladder: Secondary | ICD-10-CM | POA: Insufficient documentation

## 2023-07-19 DIAGNOSIS — M1A00X0 Idiopathic chronic gout, unspecified site, without tophus (tophi): Secondary | ICD-10-CM | POA: Insufficient documentation

## 2023-07-19 DIAGNOSIS — K219 Gastro-esophageal reflux disease without esophagitis: Secondary | ICD-10-CM

## 2023-07-19 LAB — POCT URINALYSIS DIP (MANUAL ENTRY)
Bilirubin, UA: NEGATIVE
Glucose, UA: NEGATIVE mg/dL
Ketones, POC UA: NEGATIVE mg/dL
Nitrite, UA: NEGATIVE
Protein Ur, POC: 30 mg/dL — AB
Spec Grav, UA: 1.015 (ref 1.010–1.025)
Urobilinogen, UA: 0.2 E.U./dL
pH, UA: 7 (ref 5.0–8.0)

## 2023-07-19 MED ORDER — CIPROFLOXACIN HCL 500 MG PO TABS: 500.0000 mg | ORAL_TABLET | Freq: Two times a day (BID) | ORAL | 0 refills | Status: DC

## 2023-07-19 NOTE — Discharge Instructions (Signed)
You were diagnosed with a UTI today.  I have sent out an antibiotic to take twice/day x 5 days.  Increase fluid intake and we will send the urine for culture.  I recommend you talk with your doctor about your nausea and weight loss.

## 2023-07-19 NOTE — Telephone Encounter (Signed)
Called patient back to let him know that his BP were great. He is not sure what medications he is taking, when asked about amlodipine.  Right now his is at urgent care talking with the provider. Asked patient to call our office back if he has further questions or concerns. Patient is on several medications that could effect his BP. Patient verbalized understanding.

## 2023-07-19 NOTE — Telephone Encounter (Signed)
Pt calling to give his BP reporting's.  BP- 8/22-124/68 8/24-121/76, 98/68 8/26- 146/79 8/31-103/61 9/03-122/75 9/10-122/75

## 2023-07-19 NOTE — ED Provider Notes (Signed)
[facility]    CSN: [id number] Arrival date & time: 07/19/23  1505      History   Chief Complaint Chief Complaint  Patient presents with   UTI Symptoms    HPI Robert Reilly is a 82 y.o. male.   Patient is here for possible UTI.  He has has two procedures for bladder cancer, last was about 4 weeks ago.  He is having burning with urination, having urinary frequency.    He states he has had some issues since his last procedure, so not really sure when this started necessarily.  He stays cold, but no fevers per se.  No n/v.  However, he does feel sick to his stomach after the procedure.        Past Medical History:  Diagnosis Date   Acute myocardial infarction of other inferior wall, subsequent episode of care    Cancer [facility])    melenoma  back , left arm   4-5 yrs ago   Coronary atherosclerosis of native coronary artery    GERD (gastroesophageal reflux disease)    Gout    History of hiatal hernia    Hypercholesteremia    Hypertension    Old MI (myocardial infarction) 09/10/2013   2004, stent to RCA   Osteoarthritis    Other disorder of calcium metabolism    Tick bite 05/25/2016   states has had 5 in past month    Patient Active Problem List   Diagnosis Date Noted   Bladder mass 07/19/2023   Chronic gouty arthritis 07/19/2023   Malignant neoplasm of urinary bladder (HCC) 07/19/2023   Malignant neoplasm of lateral wall of urinary bladder (HCC) 06/19/2023   Allergy to alpha-gal 05/09/2023   Gout 05/09/2023   Coronary artery disease 05/09/2023   Hypertension 05/09/2023   Chronic low back pain 01/25/2023   Acute appendicitis 02/26/2021   Hyperlipidemia 10/29/2019   Unstable angina (HCC) 10/28/2019   Bilateral impacted cerumen 09/07/2015   Bilateral sensorineural hearing loss 09/07/2015   Old MI (myocardial infarction) 09/10/2013   Coronary artery disease involving native coronary artery of native heart with unstable angina pectoris (HCC)  09/10/2013   RA (rheumatoid arthritis) (HCC) 09/10/2013   HTN (hypertension) 09/10/2013    Past Surgical History:  Procedure Laterality Date   CARDIAC CATHETERIZATION     CORONARY ANGIOPLASTY     stent x 1    12-15 yrs ago   CORONARY STENT INTERVENTION N/A 10/28/2019   Procedure: CORONARY STENT INTERVENTION;  Surgeon: Marykay Lex, MD;  Location: [facility] INVASIVE CV LAB;  Service: Cardiovascular;  Laterality: N/A;   EYE SURGERY Bilateral    cataracts   INSERTION OF MESH N/A 05/30/2016   Procedure: INSERTION OF MESH;  Surgeon: Avel Peace, MD;  Location: [facility] ORS;  Service: General;  Laterality: N/A;   LAPAROSCOPIC APPENDECTOMY N/A 02/28/2021   Procedure: APPENDECTOMY LAPAROSCOPIC;  Surgeon: Diamantina Monks, MD;  Location: [facility] OR;  Service: General;  Laterality: N/A;   LEFT HEART CATH AND CORONARY ANGIOGRAPHY N/A 10/28/2019   Procedure: LEFT HEART CATH AND CORONARY ANGIOGRAPHY;  Surgeon: Marykay Lex, MD;  Location: [facility] INVASIVE CV LAB;  Service: Cardiovascular;  Laterality: N/A;   MELANOMA EXCISION     back, left arm   SHOULDER ARTHROSCOPY WITH ROTATOR CUFF REPAIR Left    UMBILICAL HERNIA REPAIR N/A 05/30/2016   Procedure: UMBILICAL HERNIA REPAIR;  Surgeon: Avel Peace, MD;  Location: [facility];  Service: General;  Laterality: N/A;  Home Medications    Prior to Admission medications   Medication Sig Start Date End Date Taking? Authorizing Provider  acetaminophen (TYLENOL) 500 MG tablet Take 500 mg by mouth every 6 (six) hours as needed for moderate pain or headache.    [provider]  allopurinol (ZYLOPRIM) 100 MG tablet Take 100 mg by mouth 2 (two) times daily.  02/13/15   [provider]  amLODipine (NORVASC) 2.5 MG tablet Take 1 tablet (2.5 mg total) by mouth daily. 05/23/22 06/21/23  Sharlene Dory, PA-C  Ascorbic Acid (VITAMIN C) 100 MG tablet Take 100 mg by mouth daily.    [provider]  aspirin EC 81 MG tablet Take 81 mg by mouth daily.     [provider]  colchicine 0.6 MG tablet Take 0.6 mg by mouth every other day.     [provider]  EPINEPHrine 0.3 mg/0.3 mL IJ SOAJ injection Inject 0.3 mg into the skin as needed.    [provider]  fenofibrate 160 MG tablet Take 1 tablet (160 mg total) by mouth daily. 05/04/23   Sharlene Dory, PA-C  Flaxseed, Linseed, (FLAX SEED OIL) 1000 MG CAPS Take 1,000 mg by mouth 2 (two) times daily.     [provider]  hydrochlorothiazide (HYDRODIURIL) 25 MG tablet TAKE 1 TABLET (25 MG TOTAL) BY MOUTH DAILY. 07/03/23   Sharlene Dory, PA-C  isosorbide mononitrate (IMDUR) 30 MG 24 hr tablet Take 60 mg by mouth daily.    [provider]  loratadine (CLARITIN) 10 MG tablet Take 10 mg by mouth daily as needed for itching.    [provider]  metoprolol succinate (TOPROL XL) 50 MG 24 hr tablet Take 50 mg by mouth daily.    [provider]  Multiple Vitamins-Minerals (AIRBORNE GUMMIES PO) Take 2 tablets by mouth daily.    [provider]  nitroGLYCERIN (NITROSTAT) 0.4 MG SL tablet Place 1 tablet (0.4 mg total) under the tongue every 5 (five) minutes as needed for chest pain. 02/13/18   Rosalio Macadamia, NP  omeprazole (PRILOSEC) 20 MG capsule TAKE 1 CAPSULE BY MOUTH 2 TIMES DAILY BEFORE A MEAL. 05/23/22 05/24/23  Sharlene Dory, PA-C  predniSONE (DELTASONE) 10 MG tablet Take 10 mg by mouth as needed. 01/25/23   [provider]  ramipril (ALTACE) 10 MG capsule Take one (1) tablet by mouth ( 10 mg) daily. 06/20/23   Jake Bathe, MD  rosuvastatin (CRESTOR) 20 MG tablet TAKE 1 TABLET (20 MG TOTAL) BY MOUTH AT BEDTIME. 07/03/23 07/03/24  Sharlene Dory, PA-C  solifenacin (VESICARE) 5 MG tablet Take 5 mg by mouth daily.    [provider]  triamcinolone cream (KENALOG) 0.1 % Apply 1 Application topically 2 (two) times daily. 08/07/22   Tomi Bamberger, PA-C  Trospium Chloride 60 MG CP24 Take 60 mg by mouth as directed. 1 capsule 1  hour before a meal on an empty stomach Orally Once a day    [provider]  Vitamin D, Cholecalciferol, 25 MCG (1000 UT) CAPS Take 1,000 Units by mouth daily.    [provider]  zinc gluconate 50 MG tablet Take 50 mg by mouth daily.    [provider]    Family History Family History  Problem Relation Age of Onset   Stroke Mother    Cancer Father     Social History Social History   Tobacco Use   Smoking status: Former  Current packs/day: 0.00    Types: Cigarettes    Quit date: 05/25/2001    Years since quitting: 22.1   Smokeless tobacco: Former  Building services engineer status: Never Used  Substance Use Topics   Alcohol use: Yes    Comment: 2-3 drinks week- gin   Drug use: No     Allergies   Other and Tetracyclines & related   Review of Systems Review of Systems  Constitutional:  Positive for chills. Negative for fever.  HENT: Negative.    Respiratory: Negative.    Cardiovascular: Negative.   Gastrointestinal: Negative.   Genitourinary:  Positive for dysuria and frequency.  Musculoskeletal: Negative.   Psychiatric/Behavioral: Negative.       Physical Exam Triage Vital Signs ED Triage Vitals  Encounter Vitals Group     BP 07/19/23 1512 126/75     Systolic BP Percentile --      Diastolic BP Percentile --      Pulse Rate 07/19/23 1512 69     Resp 07/19/23 1512 18     Temp 07/19/23 1512 97.9 F (36.6 C)     Temp Source 07/19/23 1512 Oral     SpO2 07/19/23 1512 98 %     Weight 07/19/23 1511 177 lb 9.6 oz (80.6 kg)     Height 07/19/23 1511 5' 8.5" (1.74 m)     Head Circumference --      Peak Flow --      Pain Score 07/19/23 1508 6     Pain Loc --      Pain Education --      Exclude from Growth Chart --    No data found.  Updated Vital Signs BP 126/75 (BP Location: Left Arm)   Pulse 69   Temp 97.9 F (36.6 C) (Oral)   Resp 18   Ht 5' 8.5" (1.74 m)   Wt 80.6 kg   SpO2 98%   BMI 26.61 kg/m   Visual Acuity Right Eye  Distance:   Left Eye Distance:   Bilateral Distance:    Right Eye Near:   Left Eye Near:    Bilateral Near:     Physical Exam Constitutional:      Appearance: Normal appearance.  Cardiovascular:     Rate and Rhythm: Normal rate and regular rhythm.  Pulmonary:     Effort: Pulmonary effort is normal.     Breath sounds: Normal breath sounds.  Abdominal:     Palpations: Abdomen is soft.     Tenderness: There is abdominal tenderness.     Comments: suprapubic  Neurological:     General: No focal deficit present.     Mental Status: He is alert.  Psychiatric:        Mood and Affect: Mood normal.      UC Treatments / Results  Labs (all labs ordered are listed, but only abnormal results are displayed) Labs Reviewed  POCT URINALYSIS DIP (MANUAL ENTRY) - Abnormal; Notable for the following components:      Result Value   Clarity, UA cloudy (*)    Blood, UA moderate (*)    Protein Ur, POC =30 (*)    Leukocytes, UA Moderate (2+) (*)    All other components within normal limits    EKG   Radiology No results found.  Procedures Procedures (including critical care time)  Medications Ordered in UC Medications - No data to display  Initial Impression / Assessment and Plan / UC Course  I have  reviewed the triage vital signs and the nursing notes.  Pertinent labs & imaging results that were available during my care of the patient were reviewed by me and considered in my medical decision making (see chart for details).   Final Clinical Impressions(s) / UC Diagnoses   Final diagnoses:  Urinary tract infection without hematuria, site unspecified     Discharge Instructions      You were diagnosed with a UTI today.  I have sent out an antibiotic to take twice/day x 5 days.  Increase fluid intake and we will send the urine for culture.  I recommend you talk with your doctor about your nausea and weight loss.     ED Prescriptions     Medication Sig Dispense Auth.  Provider   ciprofloxacin (CIPRO) 500 MG tablet Take 1 tablet (500 mg total) by mouth every 12 (twelve) hours. 10 tablet Jannifer Franklin, MD      PDMP not reviewed this encounter.   Jannifer Franklin, MD 07/19/23 [phone]

## 2023-07-19 NOTE — ED Triage Notes (Signed)
"  I have bladder cancer currently, in August had a procedure last, I am followed by [facility] with upcoming appt". "Prior to the procedure in August I had one in July and got a UTI after, so I think it is the same". "Hurts when I pee and when I don't". No fever. "This has been since last week".

## 2023-07-21 LAB — URINE CULTURE: Culture: >=100000 — AB

## 2023-07-23 ENCOUNTER — Telehealth: Payer: Self-pay | Admitting: Cardiology

## 2023-07-23 DIAGNOSIS — K219 Gastro-esophageal reflux disease without esophagitis: Secondary | ICD-10-CM

## 2023-07-23 MED ORDER — OMEPRAZOLE 20 MG PO CPDR: DELAYED_RELEASE_CAPSULE | ORAL | 3 refills | Status: DC

## 2023-07-23 NOTE — Telephone Encounter (Signed)
*  STAT* If patient is at the pharmacy, call can be transferred to refill team.   1. Which medications need to be refilled? (please list name of each medication and dose if known) omeprazole (PRILOSEC) 20 MG capsule (Expired)   2. Which pharmacy/location (including street and city if local pharmacy) is medication to be sent to? [facility] - [place], [place] - [address]   3. Do they need a 30 day or 90 day supply? 90

## 2023-07-24 ENCOUNTER — Other Ambulatory Visit: Payer: Self-pay | Admitting: Physician Assistant

## 2023-07-24 LAB — LAB REPORT - SCANNED
A1c: 6.6
EGFR: 46

## 2023-09-24 ENCOUNTER — Other Ambulatory Visit: Payer: Self-pay | Admitting: Cardiology

## 2023-09-25 ENCOUNTER — Telehealth: Payer: Self-pay | Admitting: Cardiology

## 2023-09-25 NOTE — Telephone Encounter (Signed)
*  STAT* If patient is at the pharmacy, call can be transferred to refill team.   1. Which medications need to be refilled? (please list name of each medication and dose if known)   ramipril (ALTACE) 10 MG capsule     2. Would you like to learn more about the convenience, safety, & potential cost savings by using the [facility]? No   3. Are you open to using the [facility] (Type [facility] No   4. Which pharmacy/location (including street and city if local pharmacy) is medication to be sent to? [facility] - [place], [place] - [address]    5. Do they need a 30 day or 90 day supply? 90 day  Pt was last seen on 06/21/23

## 2023-09-26 ENCOUNTER — Other Ambulatory Visit: Payer: Self-pay

## 2023-09-26 MED ORDER — RAMIPRIL 10 MG PO CAPS: ORAL_CAPSULE | ORAL | 2 refills | Status: DC

## 2023-09-26 NOTE — Telephone Encounter (Signed)
RX sent to requested Pharmacy

## 2024-05-27 ENCOUNTER — Other Ambulatory Visit: Payer: Self-pay | Admitting: Cardiology

## 2024-05-28 ENCOUNTER — Encounter: Payer: Self-pay | Admitting: Emergency Medicine

## 2024-05-28 ENCOUNTER — Ambulatory Visit
Admission: EM | Admit: 2024-05-28 | Discharge: 2024-05-28 | Disposition: A | Discharge status: Home / Self Care | Attending: Physician Assistant | Admitting: Physician Assistant

## 2024-05-28 DIAGNOSIS — Z1589 Genetic susceptibility to other disease: Secondary | ICD-10-CM | POA: Insufficient documentation

## 2024-05-28 DIAGNOSIS — Z79899 Other long term (current) drug therapy: Secondary | ICD-10-CM | POA: Insufficient documentation

## 2024-05-28 DIAGNOSIS — R300 Dysuria: Secondary | ICD-10-CM

## 2024-05-28 DIAGNOSIS — M47819 Spondylosis without myelopathy or radiculopathy, site unspecified: Secondary | ICD-10-CM | POA: Insufficient documentation

## 2024-05-28 DIAGNOSIS — Z8551 Personal history of malignant neoplasm of bladder: Secondary | ICD-10-CM | POA: Diagnosis not present

## 2024-05-28 DIAGNOSIS — R768 Other specified abnormal immunological findings in serum: Secondary | ICD-10-CM | POA: Insufficient documentation

## 2024-05-28 DIAGNOSIS — M1991 Primary osteoarthritis, unspecified site: Secondary | ICD-10-CM | POA: Insufficient documentation

## 2024-05-28 DIAGNOSIS — R350 Frequency of micturition: Secondary | ICD-10-CM | POA: Diagnosis not present

## 2024-05-28 DIAGNOSIS — M1120 Other chondrocalcinosis, unspecified site: Secondary | ICD-10-CM | POA: Insufficient documentation

## 2024-05-28 DIAGNOSIS — L509 Urticaria, unspecified: Secondary | ICD-10-CM | POA: Insufficient documentation

## 2024-05-28 LAB — POCT URINE DIPSTICK
Bilirubin, UA: NEGATIVE
Blood, UA: NEGATIVE
Glucose, UA: NEGATIVE mg/dL
Ketones, POC UA: NEGATIVE mg/dL
Nitrite, UA: NEGATIVE
POC PROTEIN,UA: NEGATIVE
Spec Grav, UA: 1.01 (ref 1.010–1.025)
Urobilinogen, UA: 0.2 U/dL
pH, UA: 6.5 (ref 5.0–8.0)

## 2024-05-28 MED ORDER — CIPROFLOXACIN HCL 500 MG PO TABS: 500.0000 mg | ORAL_TABLET | Freq: Two times a day (BID) | ORAL | 0 refills | Status: DC

## 2024-05-28 NOTE — ED Triage Notes (Signed)
 Pt presents c/o UTI sxs. Pt says he has bladder cancer and and is taking BCG treatments. Pt says he is still feeling a burning sensation when he goes to the bathroom.

## 2024-05-28 NOTE — ED Provider Notes (Signed)
 [facility]    CSN: [id number] Arrival date & time: 05/28/24  1506      History   Chief Complaint Chief Complaint  Patient presents with   Bladder Problem     HPI Robert Reilly is a 83 y.o. male.   Patient presents today for evaluation of UTI symptoms.  He reports that he has had urinary frequency and dysuria.  He has had history of bladder cancer and reports he is taking BCG treatments for same.  He denies any fever, nausea, vomiting or abdominal pain.  The history is provided by the patient.    Past Medical History:  Diagnosis Date   Acute myocardial infarction of other inferior wall, subsequent episode of care    Cancer (HCC)    melenoma  back , left arm   4-5 yrs ago   Coronary atherosclerosis of native coronary artery    GERD (gastroesophageal reflux disease)    Gout    History of hiatal hernia    Hypercholesteremia    Hypertension    Old MI (myocardial infarction) 09/10/2013   2004, stent to RCA   Osteoarthritis    Other disorder of calcium  metabolism    Tick bite 05/25/2016   states has had 5 in past month    Patient Active Problem List   Diagnosis Date Noted   Hives 05/28/2024   Human leukocyte antigen B27 positive 05/28/2024   Positive anti-CCP test 05/28/2024   Long term current use of therapeutic drug 05/28/2024   Primary osteoarthritis 05/28/2024   Pseudogout 05/28/2024   Spondyloarthritis 05/28/2024   Bladder mass 07/19/2023   Chronic gouty arthritis 07/19/2023   Malignant neoplasm of urinary bladder (HCC) 07/19/2023   Malignant neoplasm of lateral wall of urinary bladder (HCC) 06/19/2023   Allergy to alpha-gal 05/09/2023   Gout 05/09/2023   Coronary artery disease 05/09/2023   Hypertension 05/09/2023   Chronic low back pain 01/25/2023   Acute appendicitis 02/26/2021   Hyperlipidemia 10/29/2019   Unstable angina (HCC) 10/28/2019   Bilateral impacted cerumen 09/07/2015   Bilateral sensorineural hearing loss 09/07/2015   Old MI  (myocardial infarction) 09/10/2013   Coronary artery disease involving native coronary artery of native heart with unstable angina pectoris (HCC) 09/10/2013   RA (rheumatoid arthritis) (HCC) 09/10/2013   HTN (hypertension) 09/10/2013    Past Surgical History:  Procedure Laterality Date   CARDIAC CATHETERIZATION     CORONARY ANGIOPLASTY     stent x 1    12-15 yrs ago   CORONARY STENT INTERVENTION N/A 10/28/2019   Procedure: CORONARY STENT INTERVENTION;  Surgeon: Anner Alm ORN, MD;  Location: [facility] INVASIVE CV LAB;  Service: Cardiovascular;  Laterality: N/A;   EYE SURGERY Bilateral    cataracts   INSERTION OF MESH N/A 05/30/2016   Procedure: INSERTION OF MESH;  Surgeon: Krystal Russell, MD;  Location: [facility] OR[facility];  Service: General;  Laterality: N/A;   LAPAROSCOPIC APPENDECTOMY N/A 02/28/2021   Procedure: APPENDECTOMY LAPAROSCOPIC;  Surgeon: Paola Dreama SAILOR, MD;  Location: [facility] OR;  Service: General;  Laterality: N/A;   LEFT HEART CATH AND CORONARY ANGIOGRAPHY N/A 10/28/2019   Procedure: LEFT HEART CATH AND CORONARY ANGIOGRAPHY;  Surgeon: Anner Alm ORN, MD;  Location: [facility] INVASIVE CV LAB;  Service: Cardiovascular;  Laterality: N/A;   MELANOMA EXCISION     back, left arm   SHOULDER ARTHROSCOPY WITH ROTATOR CUFF REPAIR Left    UMBILICAL HERNIA REPAIR N/A 05/30/2016   Procedure: UMBILICAL HERNIA REPAIR;  Surgeon: Krystal Russell,  MD;  Location: [facility];  Service: General;  Laterality: N/A;       Home Medications    Prior to Admission medications   Medication Sig Start Date End Date Taking? Authorizing Provider  ciprofloxacin  (CIPRO ) 500 MG tablet Take 1 tablet (500 mg total) by mouth every 12 (twelve) hours. 05/28/24  Yes Billy Asberry FALCON, PA-C  acetaminophen  (TYLENOL ) 500 MG tablet Take 500 mg by mouth every 6 (six) hours as needed for moderate pain or headache.    [provider]  allopurinol  (ZYLOPRIM ) 100 MG tablet Take 100 mg by mouth 2 (two) times daily.  02/13/15   [provider]  amLODipine  (NORVASC ) 10 MG tablet Take 10 mg by mouth daily.    [provider]  amLODipine  (NORVASC ) 2.5 MG tablet TAKE 1 TABLET (2.5 MG TOTAL) BY MOUTH DAILY. 07/24/23 07/24/24  Lucien Orren SAILOR, PA-C  Ascorbic Acid (VITAMIN C) 100 MG tablet Take 100 mg by mouth daily.    [provider]  aspirin  EC 81 MG tablet Take 81 mg by mouth daily.    [provider]  colchicine  0.6 MG tablet Take 0.6 mg by mouth every other day.     [provider]  EPINEPHrine  0.3 mg/0.3 mL IJ SOAJ injection Inject 0.3 mg into the skin as needed.    [provider]  fenofibrate  160 MG tablet Take 1 tablet (160 mg total) by mouth daily. Please call [phone] to schedule an appointment for future refills. Thank you. 1st attempt. 05/27/24   Lucien Orren SAILOR, PA-C  Flaxseed, Linseed, (FLAX SEED OIL) 1000 MG CAPS Take 1,000 mg by mouth 2 (two) times daily.     [provider]  hydrochlorothiazide  (HYDRODIURIL ) 25 MG tablet TAKE 1 TABLET (25 MG TOTAL) BY MOUTH DAILY. 07/03/23   Lucien Orren SAILOR, PA-C  isosorbide  mononitrate (IMDUR ) 30 MG 24 hr tablet Take 60 mg by mouth daily.    [provider]  loratadine (CLARITIN) 10 MG tablet Take 10 mg by mouth daily as needed for itching.    [provider]  metoprolol  succinate (TOPROL  XL) 50 MG 24 hr tablet Take 50 mg by mouth daily.    [provider]  Multiple Vitamins-Minerals (AIRBORNE GUMMIES PO) Take 2 tablets by mouth daily.    [provider]  nitroGLYCERIN  (NITROSTAT ) 0.4 MG SL tablet Place 1 tablet (0.4 mg total) under the tongue every 5 (five) minutes as needed for chest pain. 02/13/18   Army Katheryn BROCKS, NP  omeprazole  (PRILOSEC) 20 MG capsule TAKE 1 CAPSULE BY MOUTH 2 TIMES DAILY BEFORE A MEAL. 07/23/23 07/23/24  Jeffrie Oneil BROCKS, MD  predniSONE (DELTASONE) 10 MG tablet Take 10 mg by mouth as needed. 01/25/23   [provider]  ramipril  (ALTACE ) 10 MG capsule Take one (1)  tablet by mouth ( 10 mg) daily. 09/26/23   Jeffrie Oneil BROCKS, MD  rosuvastatin  (CRESTOR ) 20 MG tablet TAKE 1 TABLET (20 MG TOTAL) BY MOUTH AT BEDTIME. 07/03/23 07/03/24  Lucien Orren SAILOR, PA-C  solifenacin (VESICARE) 10 MG tablet Take 10 mg by mouth daily.    [provider]  solifenacin (VESICARE) 5 MG tablet Take 5 mg by mouth daily.    [provider]  triamcinolone  cream (KENALOG ) 0.1 % Apply 1 Application topically 2 (two) times daily. 08/07/22   Billy Asberry FALCON, PA-C  Trospium Chloride 60 MG CP24 Take 60 mg by mouth as directed. 1 capsule 1 hour before a meal on an empty  stomach Orally Once a day    [provider]  Vitamin D, Cholecalciferol, 25 MCG (1000 UT) CAPS Take 1,000 Units by mouth daily.    [provider]  zinc  gluconate 50 MG tablet Take 50 mg by mouth daily.    [provider]    Family History Family History  Problem Relation Age of Onset   Stroke Mother    Cancer Father     Social History Social History   Tobacco Use   Smoking status: Former    Current packs/day: 0.00    Types: Cigarettes    Quit date: 05/25/2001    Years since quitting: 23.0    Passive exposure: Never   Smokeless tobacco: Former  Building services engineer status: Never Used  Substance Use Topics   Alcohol use: Yes    Comment: 2-3 drinks week- gin   Drug use: No     Allergies   Other, Tetracyclines & related, and Tetracycline hcl   Review of Systems Review of Systems  Constitutional:  Negative for chills and fever.  Eyes:  Negative for discharge and redness.  Respiratory:  Negative for shortness of breath.   Gastrointestinal:  Negative for abdominal pain, diarrhea, nausea and vomiting.  Genitourinary:  Positive for dysuria and frequency.  Neurological:  Negative for numbness.     Physical Exam Triage Vital Signs ED Triage Vitals  Encounter Vitals Group     BP 05/28/24 1612 115/70     Girls Systolic BP Percentile --      Girls Diastolic BP  Percentile --      Boys Systolic BP Percentile --      Boys Diastolic BP Percentile --      Pulse Rate 05/28/24 1612 70     Resp 05/28/24 1612 20     Temp 05/28/24 1612 97.8 F (36.6 C)     Temp Source 05/28/24 1612 Oral     SpO2 05/28/24 1612 95 %     Weight 05/28/24 1627 177 lb 11.1 oz (80.6 kg)     Height --      Head Circumference --      Peak Flow --      Pain Score 05/28/24 1626 7     Pain Loc --      Pain Education --      Exclude from Growth Chart --    No data found.  Updated Vital Signs BP 115/70 (BP Location: Left Arm)   Pulse 70   Temp 97.8 F (36.6 C) (Oral)   Resp 18   Wt 177 lb 11.1 oz (80.6 kg)   SpO2 98%   BMI 26.62 kg/m   Visual Acuity Right Eye Distance:   Left Eye Distance:   Bilateral Distance:    Right Eye Near:   Left Eye Near:    Bilateral Near:     Physical Exam Vitals and nursing note reviewed.  Constitutional:      General: He is not in acute distress.    Appearance: Normal appearance. He is not ill-appearing.  HENT:     Head: Normocephalic and atraumatic.  Eyes:     Conjunctiva/sclera: Conjunctivae normal.  Cardiovascular:     Rate and Rhythm: Normal rate.  Pulmonary:     Effort: Pulmonary effort is normal. No respiratory distress.  Neurological:     Mental Status: He is alert.  Psychiatric:        Mood and Affect: Mood normal.  Behavior: Behavior normal.        Thought Content: Thought content normal.      UC Treatments / Results  Labs (all labs ordered are listed, but only abnormal results are displayed) Labs Reviewed  POCT URINE DIPSTICK - Abnormal; Notable for the following components:      Result Value   Leukocytes, UA Trace (*)    All other components within normal limits  URINE CULTURE    EKG   Radiology No results found.  Procedures Procedures (including critical care time)  Medications Ordered in UC Medications - No data to display  Initial Impression / Assessment and Plan / UC Course  I  have reviewed the triage vital signs and the nursing notes.  Pertinent labs & imaging results that were available during my care of the patient were reviewed by me and considered in my medical decision making (see chart for details).    Trace leukocyte esterase on UA.  Will treat to cover possible UTI given risk factors, will order urine culture.  Discussed follow-up with urology should symptoms fail to improve or worsen in any way.  Patient expressed understanding.  Final Clinical Impressions(s) / UC Diagnoses   Final diagnoses:  Dysuria   Discharge Instructions   None    ED Prescriptions     Medication Sig Dispense Auth. Provider   ciprofloxacin  (CIPRO ) 500 MG tablet Take 1 tablet (500 mg total) by mouth every 12 (twelve) hours. 10 tablet Billy Asberry FALCON, PA-C      PDMP not reviewed this encounter.   Billy Asberry FALCON, PA-C 05/28/24 1744

## 2024-05-29 LAB — URINE CULTURE: Culture: <10000 — AB

## 2024-05-30 ENCOUNTER — Ambulatory Visit (HOSPITAL_COMMUNITY): Payer: Self-pay

## 2024-06-26 ENCOUNTER — Other Ambulatory Visit: Payer: Self-pay | Admitting: Physician Assistant

## 2024-07-08 ENCOUNTER — Other Ambulatory Visit: Payer: Self-pay | Admitting: Cardiology

## 2024-07-14 ENCOUNTER — Other Ambulatory Visit: Payer: Self-pay | Admitting: Physician Assistant

## 2024-07-14 DIAGNOSIS — I10 Essential (primary) hypertension: Secondary | ICD-10-CM

## 2024-07-16 ENCOUNTER — Telehealth: Payer: Self-pay | Admitting: Cardiology

## 2024-07-16 DIAGNOSIS — I10 Essential (primary) hypertension: Secondary | ICD-10-CM

## 2024-07-16 MED ORDER — ROSUVASTATIN CALCIUM 20 MG PO TABS: 20.0000 mg | ORAL_TABLET | Freq: Every day | ORAL | 0 refills | Status: DC

## 2024-07-16 MED ORDER — HYDROCHLOROTHIAZIDE 25 MG PO TABS: ORAL_TABLET | ORAL | 0 refills | Status: DC

## 2024-07-16 NOTE — Telephone Encounter (Signed)
 Pt's medications were sent to pt's pharmacy as requested. Confirmation received.

## 2024-07-16 NOTE — Telephone Encounter (Signed)
*  STAT* If patient is at the pharmacy, call can be transferred to refill team.   1. Which medications need to be refilled? (please list name of each medication and dose if known)   hydrochlorothiazide  (HYDRODIURIL ) 25 MG tablet   rosuvastatin  (CRESTOR ) 20 MG tablet (Expired)   2. Would you like to learn more about the convenience, safety, & potential cost savings by using the [facility]?no   3. Are you open to using the [facility] ([facility]NO   4. Which pharmacy/location (including street and city if local pharmacy) is medication to be sent to?  [facility] - , [place] - [address]     5. Do they need a 30 day or 90 day supply? 90 Day   Patient has scheduled appt on 07/31/24 and out of medication

## 2024-07-30 NOTE — Progress Notes (Deleted)
 Office Visit    Patient Name: Robert Reilly Date of Encounter: 07/30/2024  PCP:  Leonel Cole, MD   [facility]  Cardiologist:  Oneil Parchment, MD  Advanced Practice Provider:  No care team member to display Electrophysiologist:  None  Chief Complaint    Robert Reilly is a 83 y.o. male with a significant.  CAD status post PCI to RCA in 2020 by Dr. Anner, hypertension, hyperlipidemia, GERD presents today for annual follow-up visit.  He was seen in May 2022 and was doing well at that time.  He did not have any chest pain.  Sometimes when he pushed himself he may feel an emptiness in his chest but no pain or pressure.  He last saw me 05/23/2022, he feels okay without any shortness of breath or chest pain.  His weight has been up a little due to an injury and not being able to walk as often as he would like.  He enjoys duck and [employer], especially with training the dogs.  He was trying to keep up with his grandson while out hunting 1 day who is 32 years old and he ended up twisting his ankle fracturing his toe it is still healing but he has been increasing his walking distance slowly as tolerated.  He also has some indigestion issues where he has had to take Prilosec a couple of times and this helps.  He cannot eat a lot of meat due to being bit by the [employer] tick.  When he does eat meat he usually has to take Benadryl  because he gets welts that are a decent size.  Advised that it is best to stay away from red meat and stick to fish or chicken.  He was seen by me 05/2023, he tells me he was recently diagnosed with bladder cancer.  Fortunately has not spread anywhere else.  No damage to his bladder.  It was found by him having too much protein in his urine.  Thought he also saw blood as well.  Did an ultrasound and found a mass.  He does not have much of an appetite and has been losing weight.  Tries to eat but cannot.  Taste buds have changed.  Discussed incorporating a  protein drink like Ensure or boost.  Heart rate is 50 so we discontinued metoprolol  at this time.  He started on chemotherapy (locally in his bladder) and will find out if he needs anything systemic when he next sees his oncologist.  He played golf on Wednesday and felt like he gave out.  Was likely dehydrated in the heat of the day.  Discussed 64 ounces of water daily and small snacks throughout the day as able.   Reports no shortness of breath nor dyspnea on exertion. Reports no chest pain, pressure, or tightness. No edema, orthopnea, PND. Reports no palpitations.   Today he ***  Past Medical History    Past Medical History:  Diagnosis Date   Acute myocardial infarction of other inferior wall, subsequent episode of care    Cancer (HCC)    melenoma  back , left arm   4-5 yrs ago   Coronary atherosclerosis of native coronary artery    GERD (gastroesophageal reflux disease)    Gout    History of hiatal hernia    Hypercholesteremia    Hypertension    Old MI (myocardial infarction) 09/10/2013   2004, stent to RCA   Osteoarthritis    Other  disorder of calcium  metabolism    Tick bite 05/25/2016   states has had 5 in past month   Past Surgical History:  Procedure Laterality Date   CARDIAC CATHETERIZATION     CORONARY ANGIOPLASTY     stent x 1    12-15 yrs ago   CORONARY STENT INTERVENTION N/A 10/28/2019   Procedure: CORONARY STENT INTERVENTION;  Surgeon: Anner Alm ORN, MD;  Location: [facility] INVASIVE CV LAB;  Service: Cardiovascular;  Laterality: N/A;   EYE SURGERY Bilateral    cataracts   INSERTION OF MESH N/A 05/30/2016   Procedure: INSERTION OF MESH;  Surgeon: Krystal Russell, MD;  Location: [facility] ORS;  Service: General;  Laterality: N/A;   LAPAROSCOPIC APPENDECTOMY N/A 02/28/2021   Procedure: APPENDECTOMY LAPAROSCOPIC;  Surgeon: Paola Dreama SAILOR, MD;  Location: [facility] OR;  Service: General;  Laterality: N/A;   LEFT HEART CATH AND CORONARY ANGIOGRAPHY N/A 10/28/2019   Procedure: LEFT HEART  CATH AND CORONARY ANGIOGRAPHY;  Surgeon: Anner Alm ORN, MD;  Location: [facility] INVASIVE CV LAB;  Service: Cardiovascular;  Laterality: N/A;   MELANOMA EXCISION     back, left arm   SHOULDER ARTHROSCOPY WITH ROTATOR CUFF REPAIR Left    UMBILICAL HERNIA REPAIR N/A 05/30/2016   Procedure: UMBILICAL HERNIA REPAIR;  Surgeon: Krystal Russell, MD;  Location: [facility] ORS;  Service: General;  Laterality: N/A;    Allergies  Allergies  Allergen Reactions   Other Other (See Comments) and Rash    No meat: Came from a tick bite  Other Reaction(s): Other  No meat: Came from a tick bite  ALPHA-GAL SYNDROME  (CAUSES WELTS)   Tetracyclines & Related Rash    Other Reaction(s): Not available  Other Reaction(s): Other   Tetracycline Hcl Dermatitis     EKGs/Labs/Other Studies Reviewed:   The following studies were reviewed today:  Cath: 10/28/19   LV end diastolic pressure is mildly elevated. The left ventricular systolic function is normal. There is no aortic valve stenosis. CULPRIT LESION: Mid RCA lesion is 90% stenosed. A drug-eluting stent was successfully placed using a STENT RESOLUTE ONYX 3.0X12. - post-dilated to 3.35 mm Post intervention, there is a 0% residual stenosis. Previously placed Mid RCA to Dist RCA stent (DES) is widely patent. Separate Ostia for LAD & LCx   SUMMARY Severe Single Vessel CAD - mRCA 90% focal lesion upstream from m-dRCA stent Successful DES PCI of mRCA (Resolute Onyx DES 3.0 x 12 -> post-dilated to 3.35 mm) No significant disease in LAD & LCx systems - each wtih separate ostia. Likely preserved LVEF (inadequate LV Gram to assess wall motion) with mildly elevated LVEDP     RECOMMENDATIONS Overnight admission post PCI TR Band removal per protocol Continue to titrate IV NTG overnight for BP control ASA/Brilinta  DAPT x min 6 months uninterrupted (ok to interrupt after 6 months) Ensure statin & Beta Blocker (pending HR monitoring overnight)     Alm Anner,  MD, MS       TTE: 10/28/19 IMPRESSIONS     1. Left ventricular ejection fraction, by visual estimation, is 55 to 60%. The left ventricle has normal function. Left ventricular septal wall thickness was mildly increased. Mildly increased left ventricular posterior wall thickness. There is mildly  increased left ventricular hypertrophy.  2. Left ventricular diastolic parameters are consistent with Grade I diastolic dysfunction (impaired relaxation).  3. The left ventricle has no regional wall motion abnormalities.  4. Global right ventricle has normal systolic function.The right ventricular size is normal. No increase  in right ventricular wall thickness.  5. Left atrial size was normal.  6. Right atrial size was normal.  7. The mitral valve is normal in structure. Trivial mitral valve regurgitation. No evidence of mitral stenosis.  8. The tricuspid valve is normal in structure.  9. The aortic valve is tricuspid. Aortic valve regurgitation is not visualized. No evidence of aortic valve sclerosis or stenosis. 10. The pulmonic valve was normal in structure. Pulmonic valve regurgitation is not visualized. 11. TR signal is inadequate for assessing pulmonary artery systolic pressure. 12. The inferior vena cava is normal in size with <50% respiratory variability, suggesting right atrial pressure of 8 mmHg      EKG:  EKG is  ordered today.  The ekg ordered today demonstrates NSR, rate 78 bpm  Recent Labs: No results found for requested labs within last 365 days.  Recent Lipid Panel    Component Value Date/Time   CHOL 127 04/30/2023 0843   CHOL 142 03/05/2014 0944   TRIG 189 (H) 04/30/2023 0843   TRIG 199 (H) 03/05/2014 0944   HDL 33 (L) 04/30/2023 0843   HDL 38 (L) 03/05/2014 0944   CHOLHDL 3.8 04/30/2023 0843   CHOLHDL 4 03/15/2015 1018   VLDL 43.2 (H) 03/15/2015 1018   LDLCALC 62 04/30/2023 0843   LDLCALC 64 03/05/2014 0944   LDLDIRECT 76.0 03/15/2015 1018   Home Medications    Current Meds  Medication Sig   acetaminophen  (TYLENOL ) 500 MG tablet Take 500 mg by mouth every 6 (six) hours as needed for moderate pain or headache.   allopurinol  (ZYLOPRIM ) 100 MG tablet Take 100 mg by mouth 2 (two) times daily.    Ascorbic Acid (VITAMIN C) 100 MG tablet Take 100 mg by mouth daily.   aspirin  EC 81 MG tablet Take 81 mg by mouth daily.   colchicine  0.6 MG tablet Take 0.6 mg by mouth every other day.    EPINEPHrine  0.3 mg/0.3 mL IJ SOAJ injection Inject 0.3 mg into the skin as needed.   Flaxseed, Linseed, (FLAX SEED OIL) 1000 MG CAPS Take 1,000 mg by mouth 2 (two) times daily.    loratadine (CLARITIN) 10 MG tablet Take 10 mg by mouth daily as needed for itching.   Multiple Vitamins-Minerals (AIRBORNE GUMMIES PO) Take 2 tablets by mouth daily.   nitroGLYCERIN  (NITROSTAT ) 0.4 MG SL tablet Place 1 tablet (0.4 mg total) under the tongue every 5 (five) minutes as needed for chest pain.   solifenacin (VESICARE) 5 MG tablet Take 5 mg by mouth daily.   Vitamin D, Cholecalciferol, 25 MCG (1000 UT) CAPS Take 1,000 Units by mouth daily.   zinc  gluconate 50 MG tablet Take 50 mg by mouth daily.   [DISCONTINUED] amLODipine  (NORVASC ) 2.5 MG tablet Take 1 tablet (2.5 mg total) by mouth daily. Please call [phone] to schedule an overdue appointment for future refills. Thank you. 1st attempt.   [DISCONTINUED] hydrochlorothiazide  (HYDRODIURIL ) 25 MG tablet TAKE 1 TABLET (25 MG TOTAL) BY MOUTH DAILY. NEED APPOINTMENT FOR REFILLS   [DISCONTINUED] metoprolol  succinate (TOPROL -XL) 25 MG 24 hr tablet TAKE 1 TABLET BY MOUTH DAILY. TAKE WITH OR IMMEDIATELY FOLLOWING A MEAL. NEED APPOINTMENT FOR FURTHER REFILLS   [DISCONTINUED] omeprazole  (PRILOSEC) 20 MG capsule TAKE 1 CAPSULE BY MOUTH 2 TIMES DAILY BEFORE A MEAL.   [DISCONTINUED] ramipril  (ALTACE ) 10 MG capsule TAKE 1 CAPSULE BY MOUTH DAILY.   [DISCONTINUED] rosuvastatin  (CRESTOR ) 20 MG tablet Take 1 tablet (20 mg total) by mouth at bedtime.  Review of Systems      All other systems reviewed and are otherwise negative except as noted above.  Physical Exam    VS:  There were no vitals taken for this visit. , BMI There is no height or weight on file to calculate BMI.  Wt Readings from Last 3 Encounters:  05/28/24 177 lb 11.1 oz (80.6 kg)  07/19/23 177 lb 9.6 oz (80.6 kg)  06/21/23 188 lb 8 oz (85.5 kg)     GEN: Well nourished, well developed, in no acute distress. HEENT: normal. Neck: Supple, no JVD, carotid bruits, or masses. Cardiac: RRR, no murmurs, rubs, or gallops. No clubbing, cyanosis, edema.  Radials/PT 2+ and equal bilaterally.  Respiratory:  Respirations regular and unlabored, clear to auscultation bilaterally. GI: Soft, nontender, nondistended. MS: No deformity or atrophy. Skin: Warm and dry, no rash. Neuro:  Strength and sensation are intact. Psych: Normal affect.  Assessment & Plan    CAD status post PCI -No chest pain or SOB today -Continue GDMT: Amlodipine  2.5 mg daily, aspirin  81 mg daily, HCTZ 25 mg daily, stop metoprolol  succinate due to bradycardia, nitroglycerin  as needed, Crestor  20 mg daily   GERD -Still has indigestion from time to time.  He takes his Prilosec and it goes away.  Usually associated with meals.  Essential hypertension -Well-controlled today in the clinic.  Continue current medication regimen -track at home  Hyperlipidemia -Most recent lipid panel 12/2022 which showed LDL 62, HDL 33, total cholesterol 890, and triglycerides 189 -he is on a low sugar diet -He is on flaxseed oil 1,000 BID -Encouraged improvement in diet for lowering triglycerides  Appendicitis -mesh for a hernia  -no further issues   Disposition: Follow up 1 year  with Oneil Parchment, MD or APP.  Signed, Orren LOISE Fabry, PA-C 07/30/2024, 2:21 PM [facility]

## 2024-07-31 ENCOUNTER — Ambulatory Visit: Attending: Physician Assistant | Admitting: Physician Assistant

## 2024-07-31 DIAGNOSIS — E785 Hyperlipidemia, unspecified: Secondary | ICD-10-CM

## 2024-07-31 DIAGNOSIS — Z79899 Other long term (current) drug therapy: Secondary | ICD-10-CM

## 2024-07-31 DIAGNOSIS — I2510 Atherosclerotic heart disease of native coronary artery without angina pectoris: Secondary | ICD-10-CM

## 2024-07-31 DIAGNOSIS — I10 Essential (primary) hypertension: Secondary | ICD-10-CM

## 2024-08-04 ENCOUNTER — Other Ambulatory Visit: Payer: Self-pay | Admitting: Cardiology

## 2024-08-04 ENCOUNTER — Other Ambulatory Visit: Payer: Self-pay | Admitting: Physician Assistant

## 2024-08-04 DIAGNOSIS — K219 Gastro-esophageal reflux disease without esophagitis: Secondary | ICD-10-CM

## 2024-08-06 NOTE — Telephone Encounter (Signed)
 Pt of Dr. Jeffrie. Does Dr. Jeffrie want to refill this RX? Please advise.

## 2024-08-08 ENCOUNTER — Telehealth: Payer: Self-pay | Admitting: Physician Assistant

## 2024-08-08 MED ORDER — AMLODIPINE BESYLATE 2.5 MG PO TABS: 2.5000 mg | ORAL_TABLET | Freq: Every day | ORAL | 1 refills | Status: DC

## 2024-08-08 NOTE — Telephone Encounter (Signed)
*  STAT* If patient is at the pharmacy, call can be transferred to refill team.   1. Which medications need to be refilled? (please list name of each medication and dose if known)   amLODipine  (NORVASC ) 2.5 MG tablet (Expired)     2. Would you like to learn more about the convenience, safety, & potential cost savings by using the [facility]? No   3. Are you open to using the [facility] (Type [facility]. No    4. Which pharmacy/location (including street and city if local pharmacy) is medication to be sent to? [facility] - [place], [place] - [address]     5. Do they need a 30 day or 90 day supply? 90 day   b

## 2024-08-08 NOTE — Telephone Encounter (Signed)
 Pt's medication was sent to pt's pharmacy as requested. Confirmation received.

## 2024-09-14 NOTE — Progress Notes (Deleted)
 Office Visit    Patient Name: Robert Reilly Date of Encounter: 09/14/2024  PCP:  Robert Cole, MD   [facility]  Cardiologist:  Robert Parchment, MD  Advanced Practice Provider:  No care team member to display Electrophysiologist:  None  Chief Complaint    Robert Reilly is a 83 y.o. male with a significant.  CAD status post PCI to RCA in 2020 by Dr. Anner, hypertension, hyperlipidemia, GERD presents today for annual follow-up visit.  He was seen in May 2022 and was doing well at that time.  He did not have any chest pain.  Sometimes when he pushed himself he may feel an emptiness in his chest but no pain or pressure.  He last saw me 05/23/2022, he feels okay without any shortness of breath or chest pain.  His weight has been up a little due to an injury and not being able to walk as often as he would like.  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  He was trying to keep up with his grandson while out hunting 1 day who is 76 years old and he ended up twisting his ankle fracturing his toe it is still healing but he has been increasing his walking distance slowly as tolerated.  He also has some indigestion issues where he has had to take Prilosec a couple of times and this helps.  He cannot eat a lot of meat due to being bit by the [employer] tick.  When he does eat meat he usually has to take Benadryl  because he gets welts that are a decent size.  Advised that it is best to stay away from red meat and stick to fish or chicken.  I saw him 05/2023, he tells me he was recently diagnosed with bladder cancer.  Fortunately has not spread anywhere else.  No damage to his bladder.  It was found by him having too much protein in his urine.  Thought he also saw blood as well.  Did an ultrasound and found a mass.  He does not have much of an appetite and has been losing weight.  Tries to eat but cannot.  Taste buds have changed.  Discussed incorporating a protein  drink like Ensure or boost.  Heart rate is 50 so we discontinued metoprolol  at this time.  He started on chemotherapy (locally in his bladder) and will find out if he needs anything systemic when he next sees his oncologist.  He played golf on Wednesday and felt like he gave out.  Was likely dehydrated in the heat of the day.  Discussed 64 ounces of water daily and small snacks throughout the day as able.  Reports no shortness of breath nor dyspnea on exertion. Reports no chest pain, pressure, or tightness. No edema, orthopnea, PND. Reports no palpitations.   Today, he ***    Past Medical History    Past Medical History:  Diagnosis Date   Acute myocardial infarction of other inferior wall, subsequent episode of care    Cancer (HCC)    melenoma  back , left arm   4-5 yrs ago   Coronary atherosclerosis of native coronary artery    GERD (gastroesophageal reflux disease)    Gout    History of hiatal hernia    Hypercholesteremia    Hypertension    Old MI (myocardial infarction) 09/10/2013   2004, stent to RCA   Osteoarthritis    Other disorder  of calcium  metabolism    Tick bite 05/25/2016   states has had 5 in past month   Past Surgical History:  Procedure Laterality Date   CARDIAC CATHETERIZATION     CORONARY ANGIOPLASTY     stent x 1    12-15 yrs ago   CORONARY STENT INTERVENTION N/A 10/28/2019   Procedure: CORONARY STENT INTERVENTION;  Surgeon: Robert Robert ORN, MD;  Location: [facility] INVASIVE CV LAB;  Service: Cardiovascular;  Laterality: N/A;   EYE SURGERY Bilateral    cataracts   INSERTION OF MESH N/A 05/30/2016   Procedure: INSERTION OF MESH;  Surgeon: Robert Russell, MD;  Location: [facility];  Service: General;  Laterality: N/A;   LAPAROSCOPIC APPENDECTOMY N/A 02/28/2021   Procedure: APPENDECTOMY LAPAROSCOPIC;  Surgeon: Robert Dreama SAILOR, MD;  Location: [facility] OR;  Service: General;  Laterality: N/A;   LEFT HEART CATH AND CORONARY ANGIOGRAPHY N/A 10/28/2019   Procedure: LEFT HEART CATH  AND CORONARY ANGIOGRAPHY;  Surgeon: Robert Robert ORN, MD;  Location: [facility] INVASIVE CV LAB;  Service: Cardiovascular;  Laterality: N/A;   MELANOMA EXCISION     back, left arm   SHOULDER ARTHROSCOPY WITH ROTATOR CUFF REPAIR Left    UMBILICAL HERNIA REPAIR N/A 05/30/2016   Procedure: UMBILICAL HERNIA REPAIR;  Surgeon: Robert Russell, MD;  Location: [facility];  Service: General;  Laterality: N/A;    Allergies  Allergies  Allergen Reactions   Other Other (See Comments) and Rash    No meat: Came from a tick bite  Other Reaction(s): Other  No meat: Came from a tick bite  ALPHA-GAL SYNDROME  (CAUSES WELTS)   Tetracyclines & Related Rash    Other Reaction(s): Not available  Other Reaction(s): Other   Tetracycline Hcl Dermatitis     EKGs/Labs/Other Studies Reviewed:   The following studies were reviewed today:  Cath: 10/28/19   LV end diastolic pressure is mildly elevated. The left ventricular systolic function is normal. There is no aortic valve stenosis. CULPRIT LESION: Mid RCA lesion is 90% stenosed. A drug-eluting stent was successfully placed using a STENT RESOLUTE ONYX 3.0X12. - post-dilated to 3.35 mm Post intervention, there is a 0% residual stenosis. Previously placed Mid RCA to Dist RCA stent (DES) is widely patent. Separate Ostia for LAD & LCx   SUMMARY Severe Single Vessel CAD - mRCA 90% focal lesion upstream from m-dRCA stent Successful DES PCI of mRCA (Resolute Onyx DES 3.0 x 12 -> post-dilated to 3.35 mm) No significant disease in LAD & LCx systems - each wtih separate ostia. Likely preserved LVEF (inadequate LV Gram to assess wall motion) with mildly elevated LVEDP     RECOMMENDATIONS Overnight admission post PCI TR Band removal per protocol Continue to titrate IV NTG overnight for BP control ASA/Brilinta  DAPT x min 6 months uninterrupted (ok to interrupt after 6 months) Ensure statin & Beta Blocker (pending HR monitoring overnight)     Robert Anner, MD,  MS       TTE: 10/28/19 IMPRESSIONS     1. Left ventricular ejection fraction, by visual estimation, is 55 to 60%. The left ventricle has normal function. Left ventricular septal wall thickness was mildly increased. Mildly increased left ventricular posterior wall thickness. There is mildly  increased left ventricular hypertrophy.  2. Left ventricular diastolic parameters are consistent with Grade I diastolic dysfunction (impaired relaxation).  3. The left ventricle has no regional wall motion abnormalities.  4. Global right ventricle has normal systolic function.The right ventricular size is normal. No increase in  right ventricular wall thickness.  5. Left atrial size was normal.  6. Right atrial size was normal.  7. The mitral valve is normal in structure. Trivial mitral valve regurgitation. No evidence of mitral stenosis.  8. The tricuspid valve is normal in structure.  9. The aortic valve is tricuspid. Aortic valve regurgitation is not visualized. No evidence of aortic valve sclerosis or stenosis. 10. The pulmonic valve was normal in structure. Pulmonic valve regurgitation is not visualized. 11. TR signal is inadequate for assessing pulmonary artery systolic pressure. 12. The inferior vena cava is normal in size with <50% respiratory variability, suggesting right atrial pressure of 8 mmHg      EKG:  EKG is  ordered today.  The ekg ordered today demonstrates NSR, rate 78 bpm  Recent Labs: No results found for requested labs within last 365 days.  Recent Lipid Panel    Component Value Date/Time   CHOL 127 04/30/2023 0843   CHOL 142 03/05/2014 0944   TRIG 189 (H) 04/30/2023 0843   TRIG 199 (H) 03/05/2014 0944   HDL 33 (L) 04/30/2023 0843   HDL 38 (L) 03/05/2014 0944   CHOLHDL 3.8 04/30/2023 0843   CHOLHDL 4 03/15/2015 1018   VLDL 43.2 (H) 03/15/2015 1018   LDLCALC 62 04/30/2023 0843   LDLCALC 64 03/05/2014 0944   LDLDIRECT 76.0 03/15/2015 1018   Home Medications   Current  Meds  Medication Sig   acetaminophen  (TYLENOL ) 500 MG tablet Take 500 mg by mouth every 6 (six) hours as needed for moderate pain or headache.   allopurinol  (ZYLOPRIM ) 100 MG tablet Take 100 mg by mouth 2 (two) times daily.    Ascorbic Acid (VITAMIN C) 100 MG tablet Take 100 mg by mouth daily.   aspirin  EC 81 MG tablet Take 81 mg by mouth daily.   colchicine  0.6 MG tablet Take 0.6 mg by mouth every other day.    EPINEPHrine  0.3 mg/0.3 mL IJ SOAJ injection Inject 0.3 mg into the skin as needed.   Flaxseed, Linseed, (FLAX SEED OIL) 1000 MG CAPS Take 1,000 mg by mouth 2 (two) times daily.    loratadine (CLARITIN) 10 MG tablet Take 10 mg by mouth daily as needed for itching.   Multiple Vitamins-Minerals (AIRBORNE GUMMIES PO) Take 2 tablets by mouth daily.   nitroGLYCERIN  (NITROSTAT ) 0.4 MG SL tablet Place 1 tablet (0.4 mg total) under the tongue every 5 (five) minutes as needed for chest pain.   solifenacin (VESICARE) 5 MG tablet Take 5 mg by mouth daily.   Vitamin D, Cholecalciferol, 25 MCG (1000 UT) CAPS Take 1,000 Units by mouth daily.   zinc  gluconate 50 MG tablet Take 50 mg by mouth daily.   [DISCONTINUED] amLODipine  (NORVASC ) 2.5 MG tablet Take 1 tablet (2.5 mg total) by mouth daily. Please call [phone] to schedule an overdue appointment for future refills. Thank you. 1st attempt.   [DISCONTINUED] hydrochlorothiazide  (HYDRODIURIL ) 25 MG tablet TAKE 1 TABLET (25 MG TOTAL) BY MOUTH DAILY. NEED APPOINTMENT FOR REFILLS   [DISCONTINUED] metoprolol  succinate (TOPROL -XL) 25 MG 24 hr tablet TAKE 1 TABLET BY MOUTH DAILY. TAKE WITH OR IMMEDIATELY FOLLOWING A MEAL. NEED APPOINTMENT FOR FURTHER REFILLS   [DISCONTINUED] omeprazole  (PRILOSEC) 20 MG capsule TAKE 1 CAPSULE BY MOUTH 2 TIMES DAILY BEFORE A MEAL.   [DISCONTINUED] ramipril  (ALTACE ) 10 MG capsule TAKE 1 CAPSULE BY MOUTH DAILY.   [DISCONTINUED] rosuvastatin  (CRESTOR ) 20 MG tablet Take 1 tablet (20 mg total) by mouth at bedtime.  Review of  Systems      All other systems reviewed and are otherwise negative except as noted above.  Physical Exam    VS:  There were no vitals taken for this visit. , BMI There is no height or weight on file to calculate BMI.  Wt Readings from Last 3 Encounters:  05/28/24 177 lb 11.1 oz (80.6 kg)  07/19/23 177 lb 9.6 oz (80.6 kg)  06/21/23 188 lb 8 oz (85.5 kg)     GEN: Well nourished, well developed, in no acute distress. HEENT: normal. Neck: Supple, no JVD, carotid bruits, or masses. Cardiac: RRR, no murmurs, rubs, or gallops. No clubbing, cyanosis, edema.  Radials/PT 2+ and equal bilaterally.  Respiratory:  Respirations regular and unlabored, clear to auscultation bilaterally. GI: Soft, nontender, nondistended. MS: No deformity or atrophy. Skin: Warm and dry, no rash. Neuro:  Strength and sensation are intact. Psych: Normal affect.  Assessment & Plan    CAD status post PCI -No chest pain or SOB today -Continue GDMT: Amlodipine  2.5 mg daily, aspirin  81 mg daily, HCTZ 25 mg daily, stop metoprolol  succinate due to bradycardia, nitroglycerin  as needed, Crestor  20 mg daily   GERD -Still has indigestion from time to time.  He takes his Prilosec and it goes away.  Usually associated with meals.  Essential hypertension -Well-controlled today in the clinic.  Continue current medication regimen -track at home  Hyperlipidemia -Most recent lipid panel 12/2022 which showed LDL 62, HDL 33, total cholesterol 890, and triglycerides 189 -he is on a low sugar diet -He is on flaxseed oil 1,000 BID -Encouraged improvement in diet for lowering triglycerides  Appendicitis -mesh for a hernia  -no further issues   Disposition: Follow up 1 year  with Robert Parchment, MD or APP.  Signed, Orren LOISE Fabry, PA-C 09/14/2024, 3:19 PM [facility]

## 2024-09-15 ENCOUNTER — Ambulatory Visit: Admitting: Physician Assistant

## 2024-09-15 DIAGNOSIS — K353 Acute appendicitis with localized peritonitis, without perforation or gangrene: Secondary | ICD-10-CM

## 2024-09-15 DIAGNOSIS — E785 Hyperlipidemia, unspecified: Secondary | ICD-10-CM

## 2024-09-15 DIAGNOSIS — Z79899 Other long term (current) drug therapy: Secondary | ICD-10-CM

## 2024-09-15 DIAGNOSIS — I10 Essential (primary) hypertension: Secondary | ICD-10-CM

## 2024-09-15 DIAGNOSIS — I2510 Atherosclerotic heart disease of native coronary artery without angina pectoris: Secondary | ICD-10-CM

## 2024-09-15 DIAGNOSIS — K219 Gastro-esophageal reflux disease without esophagitis: Secondary | ICD-10-CM

## 2024-10-09 ENCOUNTER — Other Ambulatory Visit: Payer: Self-pay | Admitting: Cardiology

## 2024-10-13 ENCOUNTER — Ambulatory Visit
Admission: EM | Admit: 2024-10-13 | Discharge: 2024-10-13 | Disposition: A | Discharge status: Home / Self Care | Attending: Family Medicine | Admitting: Family Medicine

## 2024-10-13 DIAGNOSIS — M1A09X0 Idiopathic chronic gout, multiple sites, without tophus (tophi): Secondary | ICD-10-CM | POA: Insufficient documentation

## 2024-10-13 DIAGNOSIS — N3090 Cystitis, unspecified without hematuria: Secondary | ICD-10-CM | POA: Insufficient documentation

## 2024-10-13 LAB — POCT URINE DIPSTICK
Bilirubin, UA: NEGATIVE
Glucose, UA: NEGATIVE mg/dL
Ketones, POC UA: NEGATIVE mg/dL
Nitrite, UA: NEGATIVE
POC PROTEIN,UA: 100 — AB
Spec Grav, UA: 1.02 (ref 1.010–1.025)
Urobilinogen, UA: 1 U/dL
pH, UA: 6.5 (ref 5.0–8.0)

## 2024-10-13 MED ORDER — CIPROFLOXACIN HCL 250 MG PO TABS: 250.0000 mg | ORAL_TABLET | Freq: Two times a day (BID) | ORAL | 0 refills | Status: AC

## 2024-10-13 NOTE — Discharge Instructions (Addendum)
 You were seen today for urinary symptoms.  I have sent out an  antibiotic to treat for infection.  I will send the urine to the lab for further testing as well. Return if not improving or worsening.  Follow up with your urologist as directed

## 2024-10-13 NOTE — ED Triage Notes (Signed)
 Patient is here and reports Urinary urgency and inability to hold urine during the 2 hrs prior to the procedure. History of recurrent cath's and procedures. Followed by Urologist. No fever.

## 2024-10-13 NOTE — ED Provider Notes (Signed)
 [facility]    CSN: [id number] Arrival date & time: 10/13/24  1401      History   Chief Complaint Chief Complaint  Patient presents with   UTI Symptoms    HPI Robert Reilly is a 83 y.o. male.   He has bladder cancer, sees urology.  He has had several treatments for this.  The last treatment he was unable to keep control of his urine.  He has another treatment tomorrow, and was advised to see if he had an infection.  He states he is allergic to the catheter, and has pain afterward.   He has slight painful urination today.  He does have frequency, at times he cannot hold the urine.  Times he has to go, but gets there and cannot go.  He does get oxybutynin and pyridium with help.        Past Medical History:  Diagnosis Date   Acute myocardial infarction of other inferior wall, subsequent episode of care    Cancer (HCC)    melenoma  back , left arm   4-5 yrs ago   Coronary atherosclerosis of native coronary artery    GERD (gastroesophageal reflux disease)    Gout    History of hiatal hernia    Hypercholesteremia    Hypertension    Old MI (myocardial infarction) 09/10/2013   2004, stent to RCA   Osteoarthritis    Other disorder of calcium  metabolism    Tick bite 05/25/2016   states has had 5 in past month    Patient Active Problem List   Diagnosis Date Noted   Chronic gout of multiple sites 10/13/2024   Hives 05/28/2024   Human leukocyte antigen B27 positive 05/28/2024   Positive anti-CCP test 05/28/2024   Long term current use of therapeutic drug 05/28/2024   Primary osteoarthritis 05/28/2024   Pseudogout 05/28/2024   Spondyloarthritis 05/28/2024   Bladder mass 07/19/2023   Chronic gouty arthritis 07/19/2023   Malignant neoplasm of urinary bladder (HCC) 07/19/2023   Malignant neoplasm of lateral wall of urinary bladder (HCC) 06/19/2023   Allergy to alpha-gal 05/09/2023   Gout 05/09/2023   Coronary artery disease 05/09/2023   Hypertension  05/09/2023   Chronic low back pain 01/25/2023   Acute appendicitis 02/26/2021   Hyperlipidemia 10/29/2019   Unstable angina (HCC) 10/28/2019   Bilateral impacted cerumen 09/07/2015   Bilateral sensorineural hearing loss 09/07/2015   Old MI (myocardial infarction) 09/10/2013   Coronary artery disease involving native coronary artery of native heart with unstable angina pectoris (HCC) 09/10/2013   RA (rheumatoid arthritis) (HCC) 09/10/2013   HTN (hypertension) 09/10/2013    Past Surgical History:  Procedure Laterality Date   CARDIAC CATHETERIZATION     CORONARY ANGIOPLASTY     stent x 1    12-15 yrs ago   CORONARY STENT INTERVENTION N/A 10/28/2019   Procedure: CORONARY STENT INTERVENTION;  Surgeon: Anner Alm ORN, MD;  Location: [facility];  Service: Cardiovascular;  Laterality: N/A;   EYE SURGERY Bilateral    cataracts   INSERTION OF MESH N/A 05/30/2016   Procedure: INSERTION OF MESH;  Surgeon: Krystal Russell, MD;  Location: [facility];  Service: General;  Laterality: N/A;   LAPAROSCOPIC APPENDECTOMY N/A 02/28/2021   Procedure: APPENDECTOMY LAPAROSCOPIC;  Surgeon: Paola Dreama SAILOR, MD;  Location: [facility];  Service: General;  Laterality: N/A;   LEFT HEART CATH AND CORONARY ANGIOGRAPHY N/A 10/28/2019   Procedure: LEFT HEART CATH AND CORONARY ANGIOGRAPHY;  Surgeon:  Anner Alm ORN, MD;  Location: [facility];  Service: Cardiovascular;  Laterality: N/A;   MELANOMA EXCISION     back, left arm   SHOULDER ARTHROSCOPY WITH ROTATOR CUFF REPAIR Left    UMBILICAL HERNIA REPAIR N/A 05/30/2016   Procedure: UMBILICAL HERNIA REPAIR;  Surgeon: Krystal Russell, MD;  Location: [facility];  Service: General;  Laterality: N/A;       Home Medications    Prior to Admission medications  Medication Sig Start Date End Date Taking? Authorizing Provider  acetaminophen  (TYLENOL ) 500 MG tablet Take 500 mg by mouth every 6 (six) hours as needed for moderate pain or headache.    [provider]   allopurinol  (ZYLOPRIM ) 100 MG tablet Take 100 mg by mouth 2 (two) times daily.  02/13/15   [provider]  allopurinol  (ZYLOPRIM ) 100 MG tablet Take 100 mg by mouth daily.    [provider]  amLODipine  (NORVASC ) 10 MG tablet Take 10 mg by mouth daily.    [provider]  amLODipine  (NORVASC ) 2.5 MG tablet TAKE 1 TABLET (2.5 MG TOTAL) BY MOUTH DAILY. 10/09/24   Robert Oneil BROCKS, MD  Ascorbic Acid (VITAMIN C) 100 MG tablet Take 100 mg by mouth daily.    [provider]  ASPIRIN  81 PO     [provider]  aspirin  EC 81 MG tablet Take 81 mg by mouth daily.    [provider]  ciprofloxacin  (CIPRO ) 500 MG tablet Take 1 tablet (500 mg total) by mouth every 12 (twelve) hours. 05/28/24   Robert Asberry FALCON, PA-C  colchicine  0.6 MG tablet Take 0.6 mg by mouth every other day.     [provider]  EPINEPHrine  0.3 mg/0.3 mL IJ SOAJ injection Inject 0.3 mg into the skin as needed.    [provider]  fenofibrate  160 MG tablet Take 1 tablet (160 mg total) by mouth daily. Please call [phone] to schedule an appointment for future refills. Thank you. 1st attempt. 05/27/24   Robert Orren SAILOR, PA-C  Flaxseed, Linseed, (FLAX SEED OIL) 1000 MG CAPS Take 1,000 mg by mouth 2 (two) times daily.     [provider]  hydrochlorothiazide  (HYDRODIURIL ) 25 MG tablet TAKE 1 TABLET (25 MG TOTAL) BY MOUTH DAILY. 07/16/24   Robert Oneil BROCKS, MD  isosorbide  mononitrate (IMDUR ) 30 MG 24 hr tablet Take 60 mg by mouth daily.    [provider]  loratadine (CLARITIN) 10 MG tablet Take 10 mg by mouth daily as needed for itching.    [provider]  metoprolol  succinate (TOPROL  XL) 50 MG 24 hr tablet Take 50 mg by mouth daily.    [provider]  Multiple Vitamins-Minerals (AIRBORNE GUMMIES PO) Take 2 tablets by mouth daily.    [provider]  nitroGLYCERIN  (NITROSTAT ) 0.4 MG SL tablet Place 1 tablet (0.4 mg total) under the  tongue every 5 (five) minutes as needed for chest pain. 02/13/18   Robert Katheryn BROCKS, NP  omeprazole  (PRILOSEC) 20 MG capsule TAKE 1 CAPSULE BY MOUTH 2 TIMES DAILY BEFORE A MEAL. 08/06/24   Robert Oneil BROCKS, MD  predniSONE (DELTASONE) 10 MG tablet Take 10 mg by mouth as needed. 01/25/23   [provider]  ramipril  (ALTACE ) 10 MG capsule TAKE 1 CAPSULE BY MOUTH DAILY 07/09/24   Robert Oneil BROCKS, MD  rosuvastatin  (CRESTOR ) 20 MG tablet Take 1 tablet (20 mg total) by mouth at bedtime. 07/16/24   Robert Oneil BROCKS, MD  solifenacin (  VESICARE) 10 MG tablet Take 10 mg by mouth daily.    [provider]  solifenacin (VESICARE) 5 MG tablet Take 5 mg by mouth daily.    [provider]  triamcinolone  cream (KENALOG ) 0.1 % Apply 1 Application topically 2 (two) times daily. 08/07/22   Robert Asberry FALCON, PA-C  Trospium Chloride 60 MG CP24 Take 60 mg by mouth as directed. 1 capsule 1 hour before a meal on an empty stomach Orally Once a day    [provider]  Vitamin D, Cholecalciferol, 25 MCG (1000 UT) CAPS Take 1,000 Units by mouth daily.    [provider]  zinc  gluconate 50 MG tablet Take 50 mg by mouth daily.    [provider]    Family History Family History  Problem Relation Age of Onset   Stroke Mother    Cancer Father     Social History Social History[1]   Allergies   Other, Tetracyclines & related, and Tetracycline hcl   Review of Systems Review of Systems  Constitutional: Negative.   HENT: Negative.    Respiratory: Negative.    Cardiovascular: Negative.   Gastrointestinal: Negative.   Genitourinary:  Positive for difficulty urinating, dysuria and frequency.  Musculoskeletal: Negative.      Physical Exam Triage Vital Signs ED Triage Vitals  Encounter Vitals Group     BP 10/13/24 1452 132/77     Girls Systolic BP Percentile --      Girls Diastolic BP Percentile --      Boys Systolic BP Percentile --      Boys Diastolic BP Percentile --       Pulse Rate 10/13/24 1452 64     Resp 10/13/24 1452 18     Temp 10/13/24 1452 97.7 F (36.5 C)     Temp Source 10/13/24 1452 Oral     SpO2 10/13/24 1452 97 %     Weight 10/13/24 1451 177 lb 11.1 oz (80.6 kg)     Height 10/13/24 1451 5' 8.5 (1.74 m)     Head Circumference --      Peak Flow --      Pain Score 10/13/24 1443 0     Pain Loc --      Pain Education --      Exclude from Growth Chart --    No data found.  Updated Vital Signs BP 132/77 (BP Location: Left Arm)   Pulse 64   Temp 97.7 F (36.5 C) (Oral)   Resp 18   Ht 5' 8.5 (1.74 m)   Wt 80.6 kg   SpO2 97%   BMI 26.62 kg/m   Visual Acuity Right Eye Distance:   Left Eye Distance:   Bilateral Distance:    Right Eye Near:   Left Eye Near:    Bilateral Near:     Physical Exam Constitutional:      Appearance: Normal appearance. He is normal weight.  Cardiovascular:     Rate and Rhythm: Normal rate and regular rhythm.  Pulmonary:     Effort: Pulmonary effort is normal.     Breath sounds: Normal breath sounds.  Neurological:     General: No focal deficit present.     Mental Status: He is alert.  Psychiatric:        Mood and Affect: Mood normal.      UC Treatments / Results  Labs (all labs ordered are listed, but only abnormal results are displayed) Labs Reviewed  POCT URINE DIPSTICK -  Abnormal; Notable for the following components:      Result Value   Clarity, UA cloudy (*)    Blood, UA trace-intact (*)    POC PROTEIN,UA =100 (*)    Leukocytes, UA Moderate (2+) (*)    All other components within normal limits  URINE CULTURE    EKG   Radiology No results found.  Procedures Procedures (including critical care time)  Medications Ordered in UC Medications - No data to display  Initial Impression / Assessment and Plan / UC Course  I have reviewed the triage vital signs and the nursing notes.  Pertinent labs & imaging results that were available during my care of the patient were  reviewed by me and considered in my medical decision making (see chart for details).   Final Clinical Impressions(s) / UC Diagnoses   Final diagnoses:  Cystitis     Discharge Instructions      You were seen today for urinary symptoms.  I have sent out an  antibiotic to treat for infection.  I will send the urine to the lab for further testing as well. Return if not improving or worsening.  Follow up with your urologist as directed    ED Prescriptions     Medication Sig Dispense Auth. Provider   ciprofloxacin  (CIPRO ) 250 MG tablet Take 1 tablet (250 mg total) by mouth 2 (two) times daily for 5 days. 10 tablet Darral Longs, MD      PDMP not reviewed this encounter.     [1]  Social History Tobacco Use   Smoking status: Former    Current packs/day: 0.00    Types: Cigarettes    Quit date: 05/25/2001    Years since quitting: 23.4    Passive exposure: Never   Smokeless tobacco: Former  Building Services Engineer status: Never Used  Substance Use Topics   Alcohol use: Yes    Comment: 2-3 drinks week- gin   Drug use: No     Darral Longs, MD 10/13/24 1517

## 2024-10-14 LAB — URINE CULTURE: Culture: NO GROWTH

## 2024-10-17 ENCOUNTER — Other Ambulatory Visit: Payer: Self-pay | Admitting: Cardiology

## 2024-10-17 ENCOUNTER — Ambulatory Visit (HOSPITAL_COMMUNITY): Payer: Self-pay

## 2024-10-20 NOTE — Progress Notes (Unsigned)
 "  Office Visit    Patient Name: Corneilus Heggie Reddoch Date of Encounter: 10/20/2024  PCP:  Leonel Cole, MD   [facility]  Cardiologist:  Oneil Parchment, MD  Advanced Practice Provider:  No care team member to display Electrophysiologist:  None  Chief Complaint    Sumit Branham Heber is a 83 y.o. male with a significant.  CAD status post PCI to RCA in 2020 by Dr. Anner, hypertension, hyperlipidemia, GERD presents today for annual follow-up visit.  He was seen in May 2022 and was doing well at that time.  He did not have any chest pain.  Sometimes when he pushed himself he may feel an emptiness in his chest but no pain or pressure.  He last saw me 05/23/2022, he feels okay without any shortness of breath or chest pain.  His weight has been up a little due to an injury and not being able to walk as often as he would like.  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  He was trying to keep up with his grandson while out hunting 1 day who is 21 years old and he ended up twisting his ankle fracturing his toe it is still healing but he has been increasing his walking distance slowly as tolerated.  He also has some indigestion issues where he has had to take Prilosec a couple of times and this helps.  He cannot eat a lot of meat due to being bit by the [employer] tick.  When he does eat meat he usually has to take Benadryl  because he gets welts that are a decent size.  Advised that it is best to stay away from red meat and stick to fish or chicken.  He saw me 05/2023, he tells me he was recently diagnosed with bladder cancer.  Fortunately has not spread anywhere else.  No damage to his bladder.  It was found by him having too much protein in his urine.  Thought he also saw blood as well.  Did an ultrasound and found a mass.  He does not have much of an appetite and has been losing weight.  Tries to eat but cannot.  Taste buds have changed.  Discussed incorporating a protein  drink like Ensure or boost.  Heart rate is 50 so we discontinued metoprolol  at this time.  He started on chemotherapy (locally in his bladder) and will find out if he needs anything systemic when he next sees his oncologist.  He played golf on Wednesday and felt like he gave out.  Was likely dehydrated in the heat of the day.  Discussed 64 ounces of water daily and small snacks throughout the day as able.   Reports no shortness of breath nor dyspnea on exertion. Reports no chest pain, pressure, or tightness. No edema, orthopnea, PND. Reports no palpitations.   Today, he presents for cardiovascular follow-up.He has intermittent back pain with physical activity or stress. He is undergoing BCG therapy for bladder cancer and noted lower than expected blood pressure after his infusion yesterday.  He is unsure of his amlodipine  dose, likely 2.5 mg taken at night with Crestor . He denies taking isosorbide  mononitrate despite its presence on his medication list. He has not had any issues with his blood pressure.  He has occasional sensations of his heart skipping a beat, though he is unsure of the frequency or severity. His EKG did show one PVC.   Recent lipids showed LDL  69 and triglycerides 166. He is working on lowering his A1C with Dr. Leonel.  Reports no shortness of breath nor dyspnea on exertion. Reports no chest pain, pressure, or tightness. No edema, orthopnea, PND. Reports no palpitations.   Discussed the use of AI scribe software for clinical note transcription with the patient, who gave verbal consent to proceed.   Past Medical History    Past Medical History:  Diagnosis Date   Acute myocardial infarction of other inferior wall, subsequent episode of care    Cancer (HCC)    melenoma  back , left arm   4-5 yrs ago   Coronary atherosclerosis of native coronary artery    GERD (gastroesophageal reflux disease)    Gout    History of hiatal hernia    Hypercholesteremia    Hypertension     Old MI (myocardial infarction) 09/10/2013   2004, stent to RCA   Osteoarthritis    Other disorder of calcium  metabolism    Tick bite 05/25/2016   states has had 5 in past month   Past Surgical History:  Procedure Laterality Date   CARDIAC CATHETERIZATION     CORONARY ANGIOPLASTY     stent x 1    12-15 yrs ago   CORONARY STENT INTERVENTION N/A 10/28/2019   Procedure: CORONARY STENT INTERVENTION;  Surgeon: Anner Alm ORN, MD;  Location: [facility];  Service: Cardiovascular;  Laterality: N/A;   EYE SURGERY Bilateral    cataracts   INSERTION OF MESH N/A 05/30/2016   Procedure: INSERTION OF MESH;  Surgeon: Krystal Russell, MD;  Location: [facility];  Service: General;  Laterality: N/A;   LAPAROSCOPIC APPENDECTOMY N/A 02/28/2021   Procedure: APPENDECTOMY LAPAROSCOPIC;  Surgeon: Paola Dreama SAILOR, MD;  Location: [facility];  Service: General;  Laterality: N/A;   LEFT HEART CATH AND CORONARY ANGIOGRAPHY N/A 10/28/2019   Procedure: LEFT HEART CATH AND CORONARY ANGIOGRAPHY;  Surgeon: Anner Alm ORN, MD;  Location: [facility] CV LAB;  Service: Cardiovascular;  Laterality: N/A;   MELANOMA EXCISION     back, left arm   SHOULDER ARTHROSCOPY WITH ROTATOR CUFF REPAIR Left    UMBILICAL HERNIA REPAIR N/A 05/30/2016   Procedure: UMBILICAL HERNIA REPAIR;  Surgeon: Krystal Russell, MD;  Location: [facility];  Service: General;  Laterality: N/A;    Allergies  Allergies  Allergen Reactions   Other Other (See Comments) and Rash    No meat: Came from a tick bite  Other Reaction(s): Other  No meat: Came from a tick bite  ALPHA-GAL SYNDROME  (CAUSES WELTS)   Tetracyclines & Related Rash    Other Reaction(s): Not available  Other Reaction(s): Other   Tetracycline Hcl Dermatitis     EKGs/Labs/Other Studies Reviewed:   The following studies were reviewed today:  Cath: 10/28/19   LV end diastolic pressure is mildly elevated. The left ventricular systolic function is normal. There is no aortic  valve stenosis. CULPRIT LESION: Mid RCA lesion is 90% stenosed. A drug-eluting stent was successfully placed using a STENT RESOLUTE ONYX 3.0X12. - post-dilated to 3.35 mm Post intervention, there is a 0% residual stenosis. Previously placed Mid RCA to Dist RCA stent (DES) is widely patent. Separate Ostia for LAD & LCx   SUMMARY Severe Single Vessel CAD - mRCA 90% focal lesion upstream from m-dRCA stent Successful DES PCI of mRCA (Resolute Onyx DES 3.0 x 12 -> post-dilated to 3.35 mm) No significant disease in LAD & LCx systems - each wtih separate ostia. Likely preserved  LVEF (inadequate LV Gram to assess wall motion) with mildly elevated LVEDP     RECOMMENDATIONS Overnight admission post PCI TR Band removal per protocol Continue to titrate IV NTG overnight for BP control ASA/Brilinta  DAPT x min 6 months uninterrupted (ok to interrupt after 6 months) Ensure statin & Beta Blocker (pending HR monitoring overnight)     Alm Clay, MD, MS       TTE: 10/28/19 IMPRESSIONS     1. Left ventricular ejection fraction, by visual estimation, is 55 to 60%. The left ventricle has normal function. Left ventricular septal wall thickness was mildly increased. Mildly increased left ventricular posterior wall thickness. There is mildly  increased left ventricular hypertrophy.  2. Left ventricular diastolic parameters are consistent with Grade I diastolic dysfunction (impaired relaxation).  3. The left ventricle has no regional wall motion abnormalities.  4. Global right ventricle has normal systolic function.The right ventricular size is normal. No increase in right ventricular wall thickness.  5. Left atrial size was normal.  6. Right atrial size was normal.  7. The mitral valve is normal in structure. Trivial mitral valve regurgitation. No evidence of mitral stenosis.  8. The tricuspid valve is normal in structure.  9. The aortic valve is tricuspid. Aortic valve regurgitation is not  visualized. No evidence of aortic valve sclerosis or stenosis. 10. The pulmonic valve was normal in structure. Pulmonic valve regurgitation is not visualized. 11. TR signal is inadequate for assessing pulmonary artery systolic pressure. 12. The inferior vena cava is normal in size with <50% respiratory variability, suggesting right atrial pressure of 8 mmHg      EKG:  EKG is  ordered today.  The ekg ordered today demonstrates NSR, rate 78 bpm  Recent Labs: No results found for requested labs within last 365 days.  Recent Lipid Panel    Component Value Date/Time   CHOL 127 04/30/2023 0843   CHOL 142 03/05/2014 0944   TRIG 189 (H) 04/30/2023 0843   TRIG 199 (H) 03/05/2014 0944   HDL 33 (L) 04/30/2023 0843   HDL 38 (L) 03/05/2014 0944   CHOLHDL 3.8 04/30/2023 0843   CHOLHDL 4 03/15/2015 1018   VLDL 43.2 (H) 03/15/2015 1018   LDLCALC 62 04/30/2023 0843   LDLCALC 64 03/05/2014 0944   LDLDIRECT 76.0 03/15/2015 1018   Home Medications   Current Meds  Medication Sig   acetaminophen  (TYLENOL ) 500 MG tablet Take 500 mg by mouth every 6 (six) hours as needed for moderate pain or headache.   allopurinol  (ZYLOPRIM ) 100 MG tablet Take 100 mg by mouth 2 (two) times daily.    Ascorbic Acid (VITAMIN C) 100 MG tablet Take 100 mg by mouth daily.   aspirin  EC 81 MG tablet Take 81 mg by mouth daily.   colchicine  0.6 MG tablet Take 0.6 mg by mouth every other day.    EPINEPHrine  0.3 mg/0.3 mL IJ SOAJ injection Inject 0.3 mg into the skin as needed.   Flaxseed, Linseed, (FLAX SEED OIL) 1000 MG CAPS Take 1,000 mg by mouth 2 (two) times daily.    loratadine (CLARITIN) 10 MG tablet Take 10 mg by mouth daily as needed for itching.   Multiple Vitamins-Minerals (AIRBORNE GUMMIES PO) Take 2 tablets by mouth daily.   nitroGLYCERIN  (NITROSTAT ) 0.4 MG SL tablet Place 1 tablet (0.4 mg total) under the tongue every 5 (five) minutes as needed for chest pain.   solifenacin (VESICARE) 5 MG tablet Take 5 mg by  mouth daily.   Vitamin  D, Cholecalciferol, 25 MCG (1000 UT) CAPS Take 1,000 Units by mouth daily.   zinc  gluconate 50 MG tablet Take 50 mg by mouth daily.   [DISCONTINUED] amLODipine  (NORVASC ) 2.5 MG tablet Take 1 tablet (2.5 mg total) by mouth daily. Please call [phone] to schedule an overdue appointment for future refills. Thank you. 1st attempt.   [DISCONTINUED] hydrochlorothiazide  (HYDRODIURIL ) 25 MG tablet TAKE 1 TABLET (25 MG TOTAL) BY MOUTH DAILY. NEED APPOINTMENT FOR REFILLS   [DISCONTINUED] metoprolol  succinate (TOPROL -XL) 25 MG 24 hr tablet TAKE 1 TABLET BY MOUTH DAILY. TAKE WITH OR IMMEDIATELY FOLLOWING A MEAL. NEED APPOINTMENT FOR FURTHER REFILLS   [DISCONTINUED] omeprazole  (PRILOSEC) 20 MG capsule TAKE 1 CAPSULE BY MOUTH 2 TIMES DAILY BEFORE A MEAL.   [DISCONTINUED] ramipril  (ALTACE ) 10 MG capsule TAKE 1 CAPSULE BY MOUTH DAILY.   [DISCONTINUED] rosuvastatin  (CRESTOR ) 20 MG tablet Take 1 tablet (20 mg total) by mouth at bedtime.     Review of Systems      All other systems reviewed and are otherwise negative except as noted above.  Physical Exam    VS:  There were no vitals taken for this visit. , BMI There is no height or weight on file to calculate BMI.  Wt Readings from Last 3 Encounters:  10/13/24 177 lb 11.1 oz (80.6 kg)  05/28/24 177 lb 11.1 oz (80.6 kg)  07/19/23 177 lb 9.6 oz (80.6 kg)     GEN: Well nourished, well developed, in no acute distress. HEENT: normal. Neck: Supple, no JVD, carotid bruits, or masses. Cardiac: RRR, no murmurs, rubs, or gallops. No clubbing, cyanosis, edema.  Radials/PT 2+ and equal bilaterally.  Respiratory:  Respirations regular and unlabored, clear to auscultation bilaterally. GI: Soft, nontender, nondistended. MS: No deformity or atrophy. Skin: Warm and dry, no rash. Neuro:  Strength and sensation are intact. Psych: Normal affect.  Assessment & Plan     Coronary artery disease without angina No angina symptoms. EKG shows  non-symptomatic, non-dangerous PVCs. - Continue current management without changes. -Continue GDMT: Amlodipine  2.5 mg daily, aspirin  81 mg daily, HCTZ 25 mg daily, nitroglycerin  as needed,   Essential hypertension Blood pressure well-controlled. Uncertainty about amlodipine  dose. - Check amlodipine  dose at home and inform clinic if different from 2.5 mg. - Continue current blood pressure monitoring program.  Hyperlipidemia LDL at 69, within target. Triglycerides slightly elevated at 166, likely dietary-related. - Continue current management. - Work with primary care provider to address dietary factors affecting triglyceride levels.    Disposition: Follow up 1 year  with Oneil Parchment, MD or APP.  Signed, Orren LOISE Fabry, PA-C 10/20/2024, 10:25 AM [facility]  "

## 2024-10-21 ENCOUNTER — Ambulatory Visit: Admitting: Physician Assistant

## 2024-10-21 ENCOUNTER — Encounter: Payer: Self-pay | Admitting: Physician Assistant

## 2024-10-21 VITALS — BP 112/64 | HR 67 | Ht 68.5 in | Wt 174.0 lb

## 2024-10-21 DIAGNOSIS — I10 Essential (primary) hypertension: Secondary | ICD-10-CM | POA: Diagnosis present

## 2024-10-21 DIAGNOSIS — E785 Hyperlipidemia, unspecified: Secondary | ICD-10-CM | POA: Insufficient documentation

## 2024-10-21 DIAGNOSIS — K219 Gastro-esophageal reflux disease without esophagitis: Secondary | ICD-10-CM | POA: Diagnosis present

## 2024-10-21 DIAGNOSIS — I2510 Atherosclerotic heart disease of native coronary artery without angina pectoris: Secondary | ICD-10-CM | POA: Insufficient documentation

## 2024-10-21 DIAGNOSIS — Z79899 Other long term (current) drug therapy: Secondary | ICD-10-CM | POA: Diagnosis not present

## 2024-10-21 NOTE — Patient Instructions (Signed)
 Medication Instructions:  Your physician recommends that you continue on your current medications as directed. Please refer to the Current Medication list given to you today.  *If you need a refill on your cardiac medications before your next appointment, please call your pharmacy*  Lab Work: None ordered If you have labs (blood work) drawn today and your tests are completely normal, you will receive your results only by: MyChart Message (if you have MyChart) OR A paper copy in the mail If you have any lab test that is abnormal or we need to change your treatment, we will call you to review the results.  Testing/Procedures: None ordered  Follow-Up: At [facility], you and your health needs are our priority.  As part of our continuing mission to provide you with exceptional heart care, our providers are all part of one team.  This team includes your primary Cardiologist (physician) and Advanced Practice Providers or APPs (Physician Assistants and Nurse Practitioners) who all work together to provide you with the care you need, when you need it.  Your next appointment:   1 year(s)  Provider:   Oneil Parchment, MD  or Orren Fabry, PA-C  We recommend signing up for the patient portal called MyChart.  Sign up information is provided on this After Visit Summary.  MyChart is used to connect with patients for Virtual Visits (Telemedicine).  Patients are able to view lab/test results, encounter notes, upcoming appointments, etc.  Non-urgent messages can be sent to your provider as well.   To learn more about what you can do with MyChart, go to [email or web address].

## 2024-10-27 ENCOUNTER — Other Ambulatory Visit: Payer: Self-pay | Admitting: Cardiology

## 2024-10-27 DIAGNOSIS — I10 Essential (primary) hypertension: Secondary | ICD-10-CM

## 2024-11-04 ENCOUNTER — Other Ambulatory Visit: Payer: Self-pay | Admitting: Cardiology

## 2024-11-04 DIAGNOSIS — K219 Gastro-esophageal reflux disease without esophagitis: Secondary | ICD-10-CM

## 2024-11-17 ENCOUNTER — Other Ambulatory Visit: Payer: Self-pay | Admitting: Cardiology
